# Patient Record
Sex: Female | Born: 1965
Health system: Southern US, Community
[De-identification: ages and names within clinical notes are randomized; demographics above are authoritative.]

## PROBLEM LIST (undated history)

## (undated) ENCOUNTER — Emergency Department (HOSPITAL_COMMUNITY): Payer: BC Managed Care – PPO

## (undated) DIAGNOSIS — G4730 Sleep apnea, unspecified: Secondary | ICD-10-CM

## (undated) DIAGNOSIS — I10 Essential (primary) hypertension: Secondary | ICD-10-CM

## (undated) DIAGNOSIS — E119 Type 2 diabetes mellitus without complications: Secondary | ICD-10-CM

## (undated) DIAGNOSIS — H409 Unspecified glaucoma: Secondary | ICD-10-CM

## (undated) DIAGNOSIS — D649 Anemia, unspecified: Secondary | ICD-10-CM

## (undated) DIAGNOSIS — G43909 Migraine, unspecified, not intractable, without status migrainosus: Secondary | ICD-10-CM

## (undated) DIAGNOSIS — E785 Hyperlipidemia, unspecified: Secondary | ICD-10-CM

## (undated) DIAGNOSIS — T7840XA Allergy, unspecified, initial encounter: Secondary | ICD-10-CM

## (undated) DIAGNOSIS — K219 Gastro-esophageal reflux disease without esophagitis: Secondary | ICD-10-CM

## (undated) DIAGNOSIS — K5792 Diverticulitis of intestine, part unspecified, without perforation or abscess without bleeding: Secondary | ICD-10-CM

## (undated) DIAGNOSIS — D219 Benign neoplasm of connective and other soft tissue, unspecified: Secondary | ICD-10-CM

## (undated) DIAGNOSIS — M1990 Unspecified osteoarthritis, unspecified site: Secondary | ICD-10-CM

## (undated) HISTORY — DX: Morbid (severe) obesity due to excess calories: E66.01

## (undated) HISTORY — DX: Benign neoplasm of connective and other soft tissue, unspecified: D21.9

## (undated) HISTORY — DX: Gastro-esophageal reflux disease without esophagitis: K21.9

## (undated) HISTORY — DX: Allergy, unspecified, initial encounter: T78.40XA

## (undated) HISTORY — PX: OTHER SURGICAL HISTORY: SHX169

## (undated) HISTORY — DX: Type 2 diabetes mellitus without complications: E11.9

## (undated) HISTORY — DX: Diverticulitis of intestine, part unspecified, without perforation or abscess without bleeding: K57.92

## (undated) HISTORY — DX: Migraine, unspecified, not intractable, without status migrainosus: G43.909

## (undated) HISTORY — DX: Essential (primary) hypertension: I10

## (undated) HISTORY — DX: Unspecified glaucoma: H40.9

## (undated) HISTORY — DX: Unspecified osteoarthritis, unspecified site: M19.90

## (undated) HISTORY — DX: Hyperlipidemia, unspecified: E78.5

## (undated) HISTORY — DX: Anemia, unspecified: D64.9

## (undated) HISTORY — DX: Sleep apnea, unspecified: G47.30

---

## 1996-04-21 HISTORY — PX: VAGINAL HYSTERECTOMY: SUR661

## 1998-03-30 ENCOUNTER — Other Ambulatory Visit: Admission: RE | Admit: 1998-03-30 | Discharge: 1998-03-30 | Payer: Self-pay | Admitting: Obstetrics and Gynecology

## 1999-05-30 ENCOUNTER — Other Ambulatory Visit: Admission: RE | Admit: 1999-05-30 | Discharge: 1999-05-30 | Payer: Self-pay | Admitting: Gynecology

## 2001-02-26 ENCOUNTER — Other Ambulatory Visit: Admission: RE | Admit: 2001-02-26 | Discharge: 2001-02-26 | Payer: Self-pay | Admitting: Gynecology

## 2002-02-09 ENCOUNTER — Emergency Department (HOSPITAL_COMMUNITY): Admission: EM | Admit: 2002-02-09 | Discharge: 2002-02-09 | Payer: Self-pay | Admitting: Emergency Medicine

## 2002-02-10 ENCOUNTER — Encounter: Payer: Self-pay | Admitting: Gynecology

## 2002-02-10 ENCOUNTER — Encounter: Admission: RE | Admit: 2002-02-10 | Discharge: 2002-02-10 | Payer: Self-pay | Admitting: Gynecology

## 2002-04-01 ENCOUNTER — Other Ambulatory Visit: Admission: RE | Admit: 2002-04-01 | Discharge: 2002-04-01 | Payer: Self-pay | Admitting: Gynecology

## 2004-09-05 ENCOUNTER — Ambulatory Visit: Payer: Self-pay | Admitting: Family Medicine

## 2004-10-03 ENCOUNTER — Ambulatory Visit: Payer: Self-pay | Admitting: Family Medicine

## 2004-10-24 ENCOUNTER — Other Ambulatory Visit: Admission: RE | Admit: 2004-10-24 | Discharge: 2004-10-24 | Payer: Self-pay | Admitting: Gynecology

## 2005-09-08 ENCOUNTER — Ambulatory Visit: Payer: Self-pay | Admitting: Family Medicine

## 2005-10-27 ENCOUNTER — Other Ambulatory Visit: Admission: RE | Admit: 2005-10-27 | Discharge: 2005-10-27 | Payer: Self-pay | Admitting: Gynecology

## 2006-03-16 ENCOUNTER — Encounter: Admission: RE | Admit: 2006-03-16 | Discharge: 2006-03-16 | Payer: Self-pay | Admitting: Gynecology

## 2006-11-02 ENCOUNTER — Other Ambulatory Visit: Admission: RE | Admit: 2006-11-02 | Discharge: 2006-11-02 | Payer: Self-pay | Admitting: Gynecology

## 2006-11-02 LAB — CONVERTED CEMR LAB: Pap Smear: NORMAL

## 2006-11-19 ENCOUNTER — Encounter: Payer: Self-pay | Admitting: Family Medicine

## 2006-11-30 ENCOUNTER — Ambulatory Visit: Payer: Self-pay | Admitting: Family Medicine

## 2006-11-30 DIAGNOSIS — K219 Gastro-esophageal reflux disease without esophagitis: Secondary | ICD-10-CM | POA: Insufficient documentation

## 2006-12-01 ENCOUNTER — Encounter: Payer: Self-pay | Admitting: Family Medicine

## 2006-12-02 ENCOUNTER — Encounter (INDEPENDENT_AMBULATORY_CARE_PROVIDER_SITE_OTHER): Payer: Self-pay | Admitting: *Deleted

## 2006-12-07 LAB — CONVERTED CEMR LAB
ALT: 12 units/L (ref 0–35)
Alkaline Phosphatase: 46 units/L (ref 39–117)
BUN: 12 mg/dL (ref 6–23)
Basophils Absolute: 0.1 10*3/uL (ref 0.0–0.1)
CO2: 29 meq/L (ref 19–32)
Calcium: 9.1 mg/dL (ref 8.4–10.5)
Cholesterol: 210 mg/dL (ref 0–200)
Eosinophils Absolute: 0.2 10*3/uL (ref 0.0–0.6)
GFR calc Af Amer: 142 mL/min
GFR calc non Af Amer: 118 mL/min
HDL: 44 mg/dL (ref >39.0)
Hemoglobin: 13 g/dL (ref 12.0–15.0)
Lymphocytes Relative: 41.8 % (ref 12.0–46.0)
MCHC: 33.4 g/dL (ref 30.0–36.0)
MCV: 96.2 fL (ref 78.0–100.0)
Monocytes Absolute: 0.3 10*3/uL (ref 0.2–0.7)
Monocytes Relative: 6.8 % (ref 3.0–11.0)
Neutro Abs: 2.3 10*3/uL (ref 1.4–7.7)
Platelets: 310 10*3/uL (ref 150–400)
Potassium: 3.6 meq/L (ref 3.5–5.1)
TSH: 1.59 microintl units/mL (ref 0.35–5.50)
Total Protein: 7.2 g/dL (ref 6.0–8.3)
VLDL: 17 mg/dL (ref 0–40)

## 2007-03-23 ENCOUNTER — Encounter: Payer: Self-pay | Admitting: Family Medicine

## 2007-03-23 ENCOUNTER — Encounter: Admission: RE | Admit: 2007-03-23 | Discharge: 2007-03-23 | Payer: Self-pay | Admitting: Family Medicine

## 2007-03-25 ENCOUNTER — Encounter (INDEPENDENT_AMBULATORY_CARE_PROVIDER_SITE_OTHER): Payer: Self-pay | Admitting: *Deleted

## 2007-04-01 ENCOUNTER — Encounter (INDEPENDENT_AMBULATORY_CARE_PROVIDER_SITE_OTHER): Payer: Self-pay | Admitting: *Deleted

## 2007-04-07 LAB — HM MAMMOGRAPHY: HM Mammogram: NORMAL

## 2007-06-15 ENCOUNTER — Encounter: Payer: Self-pay | Admitting: Family Medicine

## 2007-10-03 ENCOUNTER — Emergency Department (HOSPITAL_COMMUNITY): Admission: EM | Admit: 2007-10-03 | Discharge: 2007-10-03 | Payer: Self-pay | Admitting: Family Medicine

## 2007-11-08 ENCOUNTER — Other Ambulatory Visit: Admission: RE | Admit: 2007-11-08 | Discharge: 2007-11-08 | Payer: Self-pay | Admitting: Obstetrics and Gynecology

## 2008-02-07 ENCOUNTER — Ambulatory Visit: Payer: Self-pay | Admitting: Family Medicine

## 2008-02-07 DIAGNOSIS — I10 Essential (primary) hypertension: Secondary | ICD-10-CM | POA: Insufficient documentation

## 2008-02-07 LAB — CONVERTED CEMR LAB
Bilirubin Urine: NEGATIVE
Glucose, Urine, Semiquant: NEGATIVE
Specific Gravity, Urine: 1.02
WBC Urine, dipstick: NEGATIVE
pH: 5

## 2008-02-08 ENCOUNTER — Encounter: Payer: Self-pay | Admitting: Family Medicine

## 2008-02-11 ENCOUNTER — Telehealth (INDEPENDENT_AMBULATORY_CARE_PROVIDER_SITE_OTHER): Payer: Self-pay | Admitting: *Deleted

## 2008-02-11 ENCOUNTER — Encounter (INDEPENDENT_AMBULATORY_CARE_PROVIDER_SITE_OTHER): Payer: Self-pay | Admitting: *Deleted

## 2008-02-11 LAB — CONVERTED CEMR LAB
Basophils Absolute: 0 10*3/uL (ref 0.0–0.1)
Basophils Relative: 0.6 % (ref 0.0–3.0)
Calcium: 9.1 mg/dL (ref 8.4–10.5)
Cholesterol: 205 mg/dL (ref 0–200)
Creatinine, Ser: 0.6 mg/dL (ref 0.4–1.2)
Direct LDL: 130 mg/dL
Eosinophils Absolute: 0.1 10*3/uL (ref 0.0–0.7)
GFR calc Af Amer: 141 mL/min
GFR calc non Af Amer: 117 mL/min
HDL: 43.6 mg/dL (ref >39.0)
Hemoglobin: 12.8 g/dL (ref 12.0–15.0)
MCHC: 33.9 g/dL (ref 30.0–36.0)
MCV: 96.4 fL (ref 78.0–100.0)
Neutro Abs: 1.8 10*3/uL (ref 1.4–7.7)
RBC: 3.92 M/uL (ref 3.87–5.11)
RDW: 12.6 % (ref 11.5–14.6)
TSH: 1.52 microintl units/mL (ref 0.35–5.50)
Total Bilirubin: 0.7 mg/dL (ref 0.3–1.2)

## 2008-02-21 ENCOUNTER — Ambulatory Visit: Payer: Self-pay | Admitting: Family Medicine

## 2008-04-11 ENCOUNTER — Encounter: Admission: RE | Admit: 2008-04-11 | Discharge: 2008-04-11 | Payer: Self-pay | Admitting: Gynecology

## 2008-11-27 ENCOUNTER — Encounter: Payer: Self-pay | Admitting: Women's Health

## 2008-11-27 ENCOUNTER — Other Ambulatory Visit: Admission: RE | Admit: 2008-11-27 | Discharge: 2008-11-27 | Payer: Self-pay | Admitting: Obstetrics and Gynecology

## 2008-11-27 ENCOUNTER — Ambulatory Visit: Payer: Self-pay | Admitting: Women's Health

## 2009-05-14 ENCOUNTER — Telehealth (INDEPENDENT_AMBULATORY_CARE_PROVIDER_SITE_OTHER): Payer: Self-pay | Admitting: *Deleted

## 2009-05-16 ENCOUNTER — Telehealth (INDEPENDENT_AMBULATORY_CARE_PROVIDER_SITE_OTHER): Payer: Self-pay | Admitting: *Deleted

## 2009-07-04 ENCOUNTER — Encounter (INDEPENDENT_AMBULATORY_CARE_PROVIDER_SITE_OTHER): Payer: Self-pay | Admitting: *Deleted

## 2009-07-04 ENCOUNTER — Ambulatory Visit: Payer: Self-pay | Admitting: Family Medicine

## 2009-07-04 DIAGNOSIS — Z87448 Personal history of other diseases of urinary system: Secondary | ICD-10-CM | POA: Insufficient documentation

## 2009-07-04 LAB — CONVERTED CEMR LAB
Bilirubin Urine: NEGATIVE
Glucose, Urine, Semiquant: NEGATIVE
Ketones, urine, test strip: NEGATIVE
Nitrite: NEGATIVE
Specific Gravity, Urine: 1.015

## 2009-07-09 ENCOUNTER — Telehealth (INDEPENDENT_AMBULATORY_CARE_PROVIDER_SITE_OTHER): Payer: Self-pay | Admitting: *Deleted

## 2009-07-11 ENCOUNTER — Telehealth: Payer: Self-pay | Admitting: Family Medicine

## 2009-07-16 LAB — CONVERTED CEMR LAB
Albumin: 3.9 g/dL (ref 3.5–5.2)
Basophils Absolute: 0 10*3/uL (ref 0.0–0.1)
CO2: 29 meq/L (ref 19–32)
Calcium: 9.3 mg/dL (ref 8.4–10.5)
Cholesterol: 222 mg/dL — ABNORMAL HIGH (ref 0–200)
Direct LDL: 158.2 mg/dL
Eosinophils Absolute: 0.1 10*3/uL (ref 0.0–0.7)
Glucose, Bld: 91 mg/dL (ref 70–99)
H Pylori IgG: NEGATIVE
HCT: 37.2 % (ref 36.0–46.0)
Hemoglobin: 12.5 g/dL (ref 12.0–15.0)
Lymphocytes Relative: 35.6 % (ref 12.0–46.0)
Lymphs Abs: 1.8 10*3/uL (ref 0.7–4.0)
MCHC: 33.6 g/dL (ref 30.0–36.0)
Neutro Abs: 2.8 10*3/uL (ref 1.4–7.7)
RDW: 12.7 % (ref 11.5–14.6)
Sodium: 139 meq/L (ref 135–145)
TSH: 2.54 microintl units/mL (ref 0.35–5.50)
Triglycerides: 113 mg/dL (ref 0.0–149.0)

## 2009-07-18 ENCOUNTER — Encounter: Payer: Self-pay | Admitting: Family Medicine

## 2009-07-24 ENCOUNTER — Ambulatory Visit: Payer: Self-pay | Admitting: Family Medicine

## 2009-07-24 LAB — CONVERTED CEMR LAB: Fecal Occult Bld: NEGATIVE

## 2009-08-03 ENCOUNTER — Encounter (INDEPENDENT_AMBULATORY_CARE_PROVIDER_SITE_OTHER): Payer: Self-pay | Admitting: *Deleted

## 2009-08-03 ENCOUNTER — Ambulatory Visit: Payer: Self-pay | Admitting: Gastroenterology

## 2009-08-03 DIAGNOSIS — R143 Flatulence: Secondary | ICD-10-CM

## 2009-08-03 DIAGNOSIS — R141 Gas pain: Secondary | ICD-10-CM | POA: Insufficient documentation

## 2009-08-03 DIAGNOSIS — R142 Eructation: Secondary | ICD-10-CM

## 2009-08-24 ENCOUNTER — Telehealth: Payer: Self-pay | Admitting: Gastroenterology

## 2009-09-20 ENCOUNTER — Ambulatory Visit: Payer: Self-pay | Admitting: Family Medicine

## 2009-09-20 DIAGNOSIS — N898 Other specified noninflammatory disorders of vagina: Secondary | ICD-10-CM | POA: Insufficient documentation

## 2009-09-20 DIAGNOSIS — M25569 Pain in unspecified knee: Secondary | ICD-10-CM | POA: Insufficient documentation

## 2009-09-20 LAB — CONVERTED CEMR LAB
Nitrite: NEGATIVE
Specific Gravity, Urine: 1.02
WBC Urine, dipstick: NEGATIVE

## 2009-09-21 ENCOUNTER — Encounter: Payer: Self-pay | Admitting: Family Medicine

## 2009-09-24 ENCOUNTER — Telehealth (INDEPENDENT_AMBULATORY_CARE_PROVIDER_SITE_OTHER): Payer: Self-pay | Admitting: *Deleted

## 2009-10-03 ENCOUNTER — Ambulatory Visit: Payer: Self-pay | Admitting: Family Medicine

## 2009-10-04 ENCOUNTER — Encounter: Payer: Self-pay | Admitting: Family Medicine

## 2009-10-04 LAB — CONVERTED CEMR LAB
Bilirubin Urine: NEGATIVE
Crystals: NONE SEEN
Leukocytes, UA: NEGATIVE
RBC / HPF: NONE SEEN (ref <3)
Specific Gravity, Urine: 1.029 (ref 1.005–1.030)
Squamous Epithelial / LPF: NONE SEEN /lpf
Urine Glucose: NEGATIVE mg/dL
Urobilinogen, UA: 0.2 (ref 0.0–1.0)
WBC, UA: NONE SEEN cells/hpf (ref <3)

## 2009-10-30 ENCOUNTER — Encounter: Admission: RE | Admit: 2009-10-30 | Discharge: 2009-10-30 | Payer: Self-pay | Admitting: Gynecology

## 2010-01-02 ENCOUNTER — Telehealth (INDEPENDENT_AMBULATORY_CARE_PROVIDER_SITE_OTHER): Payer: Self-pay | Admitting: *Deleted

## 2010-01-29 ENCOUNTER — Telehealth: Payer: Self-pay | Admitting: Family Medicine

## 2010-05-23 NOTE — Letter (Signed)
Summary: Screening/Wellness Screening Program  Screening/Wellness Screening Program   Imported By: Lanelle Bal 09/28/2009 11:47:40  _____________________________________________________________________  External Attachment:    Type:   Image     Comment:   External Document

## 2010-05-23 NOTE — Letter (Signed)
Summary: [facility]: Immunoassay Fecal Occult Blood (iFOB) Order Form  [facility] [place]  [address] [place], [place] [address]   Phone: [phone]  Fax: [phone]      [facility]: Immunoassay Fecal Occult Blood (iFOB) Order Form   July 04, 2009 MRN: [id number]   Tiffany Gallagher 09-Dec-1965   Physicican Name:____Yvonne Lowne,DO_____________________  Diagnosis Code:______V76.51____________________      Army Fossa CMA

## 2010-05-23 NOTE — Progress Notes (Signed)
Summary: ?yeast infection  Phone Note Outgoing Call Call back at [phone]   Call placed by: Army Fossa CMA,  July 11, 2009 4:01 PM Summary of Call: Pt called and stated that from the ATB she is now has a yeast infection. Would like to know if you would call in an rx for Dilfucan to [facility] on [address]. Please advise. Army Fossa CMA  July 11, 2009 4:02 PM   Follow-up for Phone Call        diflucan 150 #2  1 by mouth x1--may repeat in 3 days as needed  Follow-up by: Loreen Freud DO,  July 11, 2009 8:39 PM  Additional Follow-up for Phone Call Additional follow up Details #1::        Left message for pt that we sent in meds. Army Fossa CMA  July 12, 2009 9:16 AM     New/Updated Medications: FLUCONAZOLE 150 MG TABS (FLUCONAZOLE) 1 by mouth once daily x1.  May repeat in 3 days as needed Prescriptions: FLUCONAZOLE 150 MG TABS (FLUCONAZOLE) 1 by mouth once daily x1.  May repeat in 3 days as needed  #2 x 0   Entered and Authorized by:   Loreen Freud DO   Signed by:   Loreen Freud DO on 07/11/2009   Method used:   Electronically to        [facility] [phone]* (retail)       [address]       [place], [place]  [address]       Ph: [phone]       Fax: [phone]   RxID:   [id number]

## 2010-05-23 NOTE — Progress Notes (Signed)
Summary: lab results  Phone Note Outgoing Call   Call placed by: Army Fossa CMA,  September 24, 2009 1:13 PM Reason for Call: Discuss lab or test results Summary of Call: Regarding lab results,   Chalymdyia/GC Probe- normal  Urine culture: contaminated---- need to repeat if pt still symptomatic Signed by Loreen Freud DO on 09/22/2009 at 3:56 PM   Follow-up for Phone Call        LMTCB> Army Fossa CMA  September 26, 2009 1:33 PM   Additional Follow-up for Phone Call Additional follow up Details #1::        Pt is aware. Army Fossa CMA  September 28, 2009 9:53 AM

## 2010-05-23 NOTE — Progress Notes (Signed)
Summary: HYDROCHLOROTHYIZIDE REFILL 9/14- refill to [facility]   Phone Note Call from Patient Call back at [phone] CELL   Caller: Patient Summary of Call: NEEDS TO REORDER 90 SUPPLY OF WATER PILL--HYDROCHLOROTHYIZIDE  SHE WILL CALL BACK TO ORDER THIS MED FROM HER MAIL ORDER--- Initial call taken by: Jerolyn Shin,  January 02, 2010 10:12 AM  Follow-up for Phone Call        Request for HCTZ last filled 05/16/09 last OV 09/20/09. PLease advise Follow-up by: Almeta Monas CMA Duncan Dull),  January 02, 2010 1:36 PM  Additional Follow-up for Phone Call Additional follow up Details #1::        ok to refill  ##90  2 refills Additional Follow-up by: Loreen Freud DO,  January 02, 2010 1:45 PM    Additional Follow-up for Phone Call Additional follow up Details #2::    left message on machine need to know which mail order company she uses.....Marland KitchenMarland KitchenDoristine Devoid CMA  January 03, 2010 9:46 AM   patient said she is almost out of pills -  send refill to [facility] [address]----90 day supply .Marland KitchenOkey Regal [place]  January 03, 2010 1:54 PM   Prescriptions: HYDROCHLOROTHIAZIDE 25 MG TABS (HYDROCHLOROTHIAZIDE) 1/2 by mouth once daily  #90 x 2   Entered by:   Doristine Devoid CMA   Authorized by:   Loreen Freud DO   Signed by:   Doristine Devoid CMA on 01/04/2010   Method used:   Electronically to        [facility] [phone]* (retail)       [address]       [place], [place]  [address]       Ph: [phone]       Fax: [phone]   RxID:   [id number]
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
## 2010-05-23 NOTE — Progress Notes (Signed)
Summary: cx proc  Phone Note Call from Patient Call back at [phone]   Caller: Patient Call For: Juanda Chance Reason for Call: Talk to Nurse Summary of Call: Patient cx her procedure because medications that Dr Juanda Chance gave her worked , states that she was told that she can cx 24hrs in advanced. Wants to speak to someone because she can't afford a cx fee.   will you charge??   Initial call taken by: Tawni Levy,  Aug 24, 2009 2:09 PM  Follow-up for Phone Call        Please review with Dr Christella Hartigan, this is not my patient. Follow-up by: Hart Carwin MD,  Aug 24, 2009 10:54 PM    Additional Follow-up for Phone Call Additional follow up Details #2::    no charge Follow-up by: Rachael Fee MD,  Aug 27, 2009 6:58 AM  Additional Follow-up for Phone Call Additional follow up Details #3:: Details for Additional Follow-up Action Taken: Patient NOT BILLED. Additional Follow-up by: Leanor Kail [facility],  Aug 28, 2009 4:00 PM

## 2010-05-23 NOTE — Progress Notes (Signed)
Summary: REFILL  Phone Note Refill Request Message from:  Fax from Pharmacy on [facility] DELIV[facility] FAX [phone]  Refills Requested: Medication #1:  HYDROCHLOROTHIAZIDE 25 MG TABS 1/2 by mouth once daily.  Medication #2:  BENAZEPRIL HCL 10 MG TABS 1 by mouth once daily Initial call taken by: Barb Merino,  May 14, 2009 9:59 AM  Follow-up for Phone Call        left message for pt- gave pt 1 fill and pt needs appt.     Prescriptions: HYDROCHLOROTHIAZIDE 25 MG TABS (HYDROCHLOROTHIAZIDE) 1/2 by mouth once daily  #100 x 0   Entered by:   Army Fossa CMA   Authorized by:   Loreen Freud DO   Signed by:   Army Fossa CMA on 05/14/2009   Method used:   Printed then faxed to ...       [facility] (mail-order)       [address]       [place], [place]  [address]       Ph: [phone]       Fax: [phone]   RxID:   [id number] BENAZEPRIL HCL 10 MG TABS (BENAZEPRIL HCL) 1 by mouth once daily  #90 x 0   Entered by:   Army Fossa CMA   Authorized by:   Loreen Freud DO   Signed by:   Army Fossa CMA on 05/14/2009   Method used:   Printed then faxed to ...       [address] [facility] (mail-order)       [address]       [place], [place]  [address]       Ph: [phone]       Fax: [phone]   RxID:   [id number]

## 2010-05-23 NOTE — Assessment & Plan Note (Signed)
Summary: cpx//cigna//lh   Vital Signs:  Patient profile:   45 year old female Height:      65.25 inches Weight:      260 pounds BMI:     43.09 Temp:     98.6 degrees F oral Pulse rate:   87 / minute Pulse rhythm:   regular BP sitting:   126 / 82  (left arm) Cuff size:   regular  Vitals Entered By: Army Fossa CMA (July 04, 2009 8:58 AM) CC: CPX, no pap. possible UTI, Dysuria, Heartburn   History of Present Illness: Pt here for cpe and labs.  Pt sees Tiffany Gallagher at [facility].    Dysuria      This is a 45 year old woman who presents with Dysuria.  The symptoms began 1 week ago.  Pt did take otc yeast medication but then she had dysuria and back pain.  The patient complains of burning with urination and urinary frequency, but denies urgency, hematuria, vaginal discharge, vaginal itching, vaginal sores, and penile discharge.  Associated symptoms include back pain.  The patient denies the following associated symptoms: nausea, vomiting, fever, shaking chills, flank pain, abdominal pain, pelvic pain, and arthralgias.  The patient denies the following risk factors: diabetes, prior antibiotics, immunosuppression, history of GU anomaly, history of pyelonephritis, pregnancy, history of STD, and analgesic abuse.    Heartburn      The patient also presents with Heartburn.  The symptoms began 4-8 weeks ago.  pt stopped taking zantac and then heartburn got worse---then when she started taking it again it did not work.  The patient complains of acid reflux, sour taste in mouth, and epigastric pain.  The patient denies the following alarm features: melena, dysphagia, hematemesis, vomiting, involuntary weight loss >5%, and history of anemia.  Treatment that was tried and either found to be ineffective or stopped due to problems include dietary changes, weight loss, elevating the head of bed, an antacid, an H2 blocker, and a PPI.    Preventive Screening-Counseling & Management  Alcohol-Tobacco  Alcohol drinks/day: <1     Alcohol type: SOCIAL     Smoking Status: current     Smoking Cessation Counseling: yes     Packs/Day: 1/2     Year Started: 1983     Passive Smoke Exposure: no  Caffeine-Diet-Exercise     Caffeine use/day: 1     Does Patient Exercise: no     Exercise Counseling: to improve exercise regimen  Hep-HIV-STD-Contraception     HIV Risk: no     Dental Visit-last 6 months yes     Dental Care Counseling: not indicated; dental care within six months  Safety-Violence-Falls     Seat Belt Use: 100      Sexual History:  single--not sexually active.        Drug Use:  never.    Current Medications (verified): 1)  Benazepril Hcl 10 Mg Tabs (Benazepril Hcl) .Marland Kitchen.. 1 By Mouth Once Daily 2)  Hydrochlorothiazide 25 Mg Tabs (Hydrochlorothiazide) .... 1/2 By Mouth Once Daily 3)  Vitamin C 4)  Vitamin D3 1000 Unit Caps (Cholecalciferol) .Marland Kitchen.. 1 By Mouth Daily 5)  Apap Extra Strength 500 Mg Tabs (Acetaminophen) .... Daily 6)  Omeprazole 20 Mg Cpdr (Omeprazole) .Marland Kitchen.. 1 By Mouth Once Daily 7)  Daily Combo Multivits/calcium  Tabs (Multiple Vitamins-Calcium) 8)  Cipro 500 Mg Tabs (Ciprofloxacin Hcl) .Marland Kitchen.. 1 By Mouth Two Times A Day  Allergies (verified): No Known Drug Allergies  Past History:  Past Medical History: Last updated: 02/07/2008 GERD Hypertension  Past Surgical History: Last updated: 11/30/2006 Caesarean section (1995) Hysterectomy (1998) partial  Family History: Last updated: 11/30/2006 Family History of CAD Female 1st degree relative <60---mother Family History Diabetes 1st degree relative Family History of Stroke F 1st degree relative <60 Family History of Arthritis Family History Hypertension  Social History: Last updated: 11/30/2006 Occupation: Chartered loss adjuster Divorced Current Smoker Alcohol use-no Drug use-no Regular exercise-yes  Risk Factors: Alcohol Use: <1 (07/04/2009) Caffeine Use: 1 (07/04/2009) Exercise: no (07/04/2009)  Risk  Factors: Smoking Status: current (07/04/2009) Packs/Day: 1/2 (07/04/2009) Passive Smoke Exposure: no (07/04/2009)  Family History: Reviewed history from 11/30/2006 and no changes required. Family History of CAD Female 1st degree relative <60---mother Family History Diabetes 1st degree relative Family History of Stroke F 1st degree relative <60 Family History of Arthritis Family History Hypertension  Social History: Reviewed history from 11/30/2006 and no changes required. Occupation: Chartered loss adjuster Divorced Current Smoker Alcohol use-no Drug use-no Regular exercise-yes Dental Care w/in 6 mos.:  yes Does Patient Exercise:  no Sexual History:  single--not sexually active Drug Use:  never  Review of Systems      See HPI General:  Denies chills, fatigue, fever, loss of appetite, malaise, sleep disorder, sweats, weakness, and weight loss. Eyes:  Denies blurring, discharge, double vision, eye irritation, eye pain, halos, itching, light sensitivity, red eye, vision loss-1 eye, and vision loss-both eyes; optho-- q49yr. ENT:  Denies decreased hearing, difficulty swallowing, ear discharge, earache, hoarseness, nasal congestion, nosebleeds, postnasal drainage, ringing in ears, sinus pressure, and sore throat. CV:  Denies bluish discoloration of lips or nails, chest pain or discomfort, difficulty breathing at night, difficulty breathing while lying down, fainting, fatigue, leg cramps with exertion, lightheadness, near fainting, palpitations, shortness of breath with exertion, swelling of feet, swelling of hands, and weight gain. Resp:  Denies chest discomfort, chest pain with inspiration, cough, coughing up blood, excessive snoring, hypersomnolence, morning headaches, pleuritic, shortness of breath, sputum productive, and wheezing. GI:  Complains of indigestion; denies abdominal pain, bloody stools, change in bowel habits, constipation, dark tarry stools, diarrhea, excessive appetite, gas, hemorrhoids,  loss of appetite, nausea, vomiting, vomiting blood, and yellowish skin color. GU:  Complains of dysuria; denies abnormal vaginal bleeding, decreased libido, discharge, genital sores, hematuria, incontinence, nocturia, urinary frequency, and urinary hesitancy. MS:  Denies joint pain, joint redness, joint swelling, loss of strength, low back pain, mid back pain, muscle aches, muscle , cramps, muscle weakness, stiffness, and thoracic pain. Derm:  Denies changes in color of skin, changes in nail beds, dryness, excessive perspiration, flushing, hair loss, insect bite(s), itching, lesion(s), poor wound healing, and rash. Neuro:  Denies brief paralysis, difficulty with concentration, disturbances in coordination, falling down, headaches, inability to speak, memory loss, numbness, poor balance, seizures, sensation of room spinning, tingling, tremors, visual disturbances, and weakness. Psych:  Denies alternate hallucination ( auditory/visual), anxiety, depression, easily angered, easily tearful, irritability, mental problems, panic attacks, sense of great danger, suicidal thoughts/plans, thoughts of violence, unusual visions or sounds, and thoughts /plans of harming others. Endo:  Denies cold intolerance, excessive hunger, excessive thirst, excessive urination, heat intolerance, polyuria, and weight change. Heme:  Denies abnormal bruising, bleeding, enlarge lymph nodes, fevers, pallor, and skin discoloration. Allergy:  Denies hives or rash, itching eyes, persistent infections, seasonal allergies, and sneezing.  Physical Exam  General:  Well-developed,well-nourished,in no acute distress; alert,appropriate and cooperative throughout examination Head:  Normocephalic and atraumatic without obvious abnormalities. No apparent alopecia or balding. Eyes:  vision  grossly intact, pupils equal, pupils round, pupils reactive to light, and no injection.   Ears:  External ear exam shows no significant lesions or  deformities.  Otoscopic examination reveals clear canals, tympanic membranes are intact bilaterally without bulging, retraction, inflammation or discharge. Hearing is grossly normal bilaterally. Nose:  External nasal examination shows no deformity or inflammation. Nasal mucosa are pink and moist without lesions or exudates. Mouth:  Oral mucosa and oropharynx without lesions or exudates.  Teeth in good repair. Neck:  No deformities, masses, or tenderness noted.no carotid bruits.   Chest Wall:  No deformities, masses, or tenderness noted. Breasts:  No mass, nodules, thickening, tenderness, bulging, retraction, inflamation, nipple discharge or skin changes noted.   Lungs:  Normal respiratory effort, chest expands symmetrically. Lungs are clear to auscultation, no crackles or wheezes. Heart:  Normal rate and regular rhythm. S1 and S2 normal without gallop, murmur, click, rub or other extra sounds. Abdomen:  Bowel sounds positive,abdomen soft and non-tender without masses, organomegaly or hernias noted. Rectal:  gyn Genitalia:  gyn Msk:  normal ROM, no joint tenderness, no joint swelling, no joint warmth, no redness over joints, no joint deformities, no joint instability, and no crepitation.   Pulses:  R posterior tibial normal, R dorsalis pedis normal, R carotid normal, L posterior tibial normal, L dorsalis pedis normal, and L carotid normal.   Extremities:  No clubbing, cyanosis, edema, or deformity noted with normal full range of motion of all joints.   Neurologic:  No cranial nerve deficits noted. Station and gait are normal. Plantar reflexes are down-going bilaterally. DTRs are symmetrical throughout. Sensory, motor and coordinative functions appear intact. Skin:  Intact without suspicious lesions or rashes Cervical Nodes:  No lymphadenopathy noted Axillary Nodes:  No palpable lymphadenopathy Psych:  Cognition and judgment appear intact. Alert and cooperative with normal attention span and  concentration. No apparent delusions, illusions, hallucinations   Impression & Recommendations:  Problem # 1:  PREVENTIVE HEALTH CARE (ICD-V70.0) GHM utd pap and mammo per gyn Orders: Venipuncture (16109) TLB-Lipid Panel (80061-LIPID) TLB-BMP (Basic Metabolic Panel-BMET) (80048-METABOL) TLB-CBC Platelet - w/Differential (85025-CBCD) TLB-Hepatic/Liver Function Pnl (80076-HEPATIC) TLB-TSH (Thyroid Stimulating Hormone) (84443-TSH) TLB-H. Pylori Abs(Helicobacter Pylori) (86677-HELICO) EKG w/ Interpretation (93000) UA Dipstick w/o Micro (manual) (60454)  Problem # 2:  HYPERTENSION (ICD-401.9)  Her updated medication list for this problem includes:    Benazepril Hcl 10 Mg Tabs (Benazepril hcl) .Marland Kitchen... 1 by mouth once daily    Hydrochlorothiazide 25 Mg Tabs (Hydrochlorothiazide) .Marland Kitchen... 1/2 by mouth once daily  Orders: Venipuncture (09811) TLB-Lipid Panel (80061-LIPID) TLB-BMP (Basic Metabolic Panel-BMET) (80048-METABOL) TLB-CBC Platelet - w/Differential (85025-CBCD) TLB-Hepatic/Liver Function Pnl (80076-HEPATIC) TLB-TSH (Thyroid Stimulating Hormone) (84443-TSH) TLB-H. Pylori Abs(Helicobacter Pylori) (86677-HELICO) EKG w/ Interpretation (93000) UA Dipstick w/o Micro (manual) (91478)  BP today: 126/82 Prior BP: 120/84 (02/21/2008)  Labs Reviewed: K+: 4.0 (02/07/2008) Creat: : 0.6 (02/07/2008)   Chol: 205 (02/07/2008)   HDL: 43.6 (02/07/2008)   LDL: DEL (02/07/2008)   TG: 64 (02/07/2008)  Problem # 3:  GERD (ICD-530.81)  Her updated medication list for this problem includes:    Omeprazole 20 Mg Cpdr (Omeprazole) .Marland Kitchen... 1 by mouth once daily  Orders: Gastroenterology Referral (GI) Venipuncture (29562) TLB-Lipid Panel (80061-LIPID) TLB-BMP (Basic Metabolic Panel-BMET) (80048-METABOL) TLB-CBC Platelet - w/Differential (85025-CBCD) TLB-Hepatic/Liver Function Pnl (80076-HEPATIC) TLB-TSH (Thyroid Stimulating Hormone) (84443-TSH) TLB-H. Pylori Abs(Helicobacter Pylori)  (86677-HELICO) EKG w/ Interpretation (93000) UA Dipstick w/o Micro (manual) (13086)  Diagnostics Reviewed:  Discussed lifestyle modifications, diet, antacids/medications, and preventive measures. Handout provided.  Problem # 4:  HEMATURIA, HX OF (ICD-V13.09)  Orders: T-Culture, Urine (16109-60454) EKG w/ Interpretation (93000) UA Dipstick w/o Micro (manual) (09811)  Complete Medication List: 1)  Benazepril Hcl 10 Mg Tabs (Benazepril hcl) .Marland Kitchen.. 1 by mouth once daily 2)  Hydrochlorothiazide 25 Mg Tabs (Hydrochlorothiazide) .... 1/2 by mouth once daily 3)  Vitamin C  4)  Vitamin D3 1000 Unit Caps (Cholecalciferol) .Marland Kitchen.. 1 by mouth daily 5)  Apap Extra Strength 500 Mg Tabs (Acetaminophen) .... Daily 6)  Omeprazole 20 Mg Cpdr (Omeprazole) .Marland Kitchen.. 1 by mouth once daily 7)  Daily Combo Multivits/calcium Tabs (Multiple vitamins-calcium) 8)  Cipro 500 Mg Tabs (Ciprofloxacin hcl) .Marland Kitchen.. 1 by mouth two times a day Prescriptions: OMEPRAZOLE 20 MG CPDR (OMEPRAZOLE) 1 by mouth once daily  #30 x 11   Entered and Authorized by:   Loreen Freud DO   Signed by:   Loreen Freud DO on 07/04/2009   Method used:   Electronically to        [facility] [phone]* (retail)       [address]       [place], [place]  [address]       Ph: [phone]       Fax: [phone]   RxID:   [id number] CIPRO 500 MG TABS (CIPROFLOXACIN HCL) 1 by mouth two times a day  #10 x 0   Entered and Authorized by:   Loreen Freud DO   Signed by:   Loreen Freud DO on 07/04/2009   Method used:   Electronically to        [facility] [phone]* (retail)       [address]       [place], [place]  [address]       Ph: [phone]       Fax: [phone]   RxID:   [id number]   Laboratory Results   Urine Tests    Routine Urinalysis   Color: yellow Appearance: Clear Glucose: negative   (Normal Range: Negative) Bilirubin: negative   (Normal Range: Negative) Ketone: negative   (Normal Range: Negative) Spec.  Gravity: 1.015   (Normal Range: 1.003-1.035) Blood: large   (Normal Range: Negative) pH: 6.5   (Normal Range: 5.0-8.0) Protein: negative   (Normal Range: Negative) Urobilinogen: 0.2   (Normal Range: 0-1) Nitrite: negative   (Normal Range: Negative) Leukocyte Esterace: negative   (Normal Range: Negative)    CommentsArmy Fossa CMA  July 04, 2009 9:09 AM       EKG  Procedure date:  07/04/2009  Findings:      Normal sinus rhythm with rate of:  61 bpm

## 2010-05-23 NOTE — Assessment & Plan Note (Signed)
History of Present Illness Visit Type: Initial Consult Primary GI MD: Rob Bunting MD Primary Provider: Loreen Freud, DO Requesting Provider: Loreen Freud, DO Chief Complaint: GERD History of Present Illness:     pleasant 45 yo woman. who had been having "trapped gas" feelings, belches alot.  Eating causes a feeling of an air bubble. So does not eating for long time. She feels like she has to eat constantly.  if she presses on her left arm, near her elbow she will belch. If she presses on her right leg, near her knee, she will also belch.  She was started on omeprazole recently, thinks things are better.  One month ago.  She takes it in afternoon or during lunchtime.  Or just before a meal.  She gets pyroris at times, this is much improved with PPI.  She has a right chest pressure, bubble sensation.  Worse with eating.  recent CBC, CMET, fobt were all negative.  NO vomitting blood, no overt lower GI bleeding.  Rare NSAIDs.  Overall she gained about has gained 10 pounds in 2 years.           Current Medications (verified): 1)  Benazepril Hcl 10 Mg Tabs (Benazepril Hcl) .Marland Kitchen.. 1 By Mouth Once Daily 2)  Hydrochlorothiazide 25 Mg Tabs (Hydrochlorothiazide) .... 1/2 By Mouth Once Daily 3)  Vitamin C 4)  Vitamin D3 1000 Unit Caps (Cholecalciferol) .Marland Kitchen.. 1 By Mouth Daily 5)  Apap Extra Strength 500 Mg Tabs (Acetaminophen) .... Daily 6)  Omeprazole 20 Mg Cpdr (Omeprazole) .Marland Kitchen.. 1 By Mouth Once Daily 7)  Daily Combo Multivits/calcium  Tabs (Multiple Vitamins-Calcium) 8)  Calcium-Vitamin D 600-200 Mg-Unit Tabs (Calcium-Vitamin D) .Marland Kitchen.. 1 By Mouth Once Daily  Allergies (verified): No Known Drug Allergies  Past History:  Past Medical History: GERD Hypertension obesity  Past Surgical History: Caesarean section (1995) Hysterectomy (1998) partial   Family History: Family History of CAD Female 1st degree relative <60---mother Family History Diabetes 1st degree relative Family  History of Stroke F 1st degree relative <60 Family History of Arthritis Family History Hypertension no colon cancer  Social History: Occupation: Chartered loss adjuster Divorced Current Smoker Alcohol use-no Drug use-no Regular exercise-yes  Review of Systems       Pertinent positive and negative review of systems were noted in the above HPI and GI specific review of systems.  All other review of systems was otherwise negative.   Vital Signs:  Patient profile:   45 year old female Height:      65.5 inches Weight:      261 pounds BMI:     42.93 Pulse rate:   64 / minute Pulse rhythm:   regular BP sitting:   108 / 68  (right arm)  Vitals Entered By: Milford Cage NCMA (August 03, 2009 8:39 AM)  Physical Exam  Additional Exam:  Constitutional: obese otherwise generally well appearing Psychiatric: alert and oriented times 3 Eyes: extraocular movements intact Mouth: oropharynx moist, no lesions Neck: supple, no lymphadenopathy Cardiovascular: heart regular rate and rythm Lungs: CTA bilaterally Abdomen: soft, non-tender, non-distended, no obvious ascites, no peritoneal signs, normal bowel sounds Extremities: no lower extremity edema bilaterally Skin: no lesions on visible extremities    Impression & Recommendations:  Problem # 1:  belching, GERD-like symptoms, obesity some of her symptoms could be explained by GERD. Belching can be a GERD phenomenon. She does note that her more classic pyrosis is much improved with proton pump inhibitor. The belching is also somewhat improved. I explained  to her that there can be no relation between pressing on her left elbow or her right knee and belching. She is very convinced that is the case. I suspect there is a certain amount of functional disorder here. I recommended she change the way she is taking her proton pump inhibitor. I also recommended she begin taking a single Gas-X pill with every meal and we will proceed with EGD at her soonest convenience  check for peptic ulcer disease, GERD complications. I told her to stop smoking.  Patient Instructions: 1)  You should change the way you are taking your antiacid medicine (omeprazole) so that you are taking it 20-30 minutes prior to a decent meal as that is the way the pill is designed to work most effectively. 2)  You will be scheduled to have an upper endoscopy. 3)  Try one gas-x pill with every meal. 4)  The medication list was reviewed and reconciled.  All changed / newly prescribed medications were explained.  A complete medication list was provided to the patient / caregiver.  Appended Document: Orders Update/EGD    Clinical Lists Changes  Problems: Added new problem of FLATULENCE ERUCTATION AND GAS PAIN (ICD-787.3) Orders: Added new Test order of EGD (EGD) - Signed

## 2010-05-23 NOTE — Progress Notes (Signed)
Summary: BENAZEPRIL REFILL  Phone Note Refill Request Message from:  Fax from Pharmacy on January 29, 2010 3:09 PM  Refills Requested: Medication #1:  BENAZEPRIL HCL 10 MG TABS 1 by mouth once daily [facility] [place], FAX [phone]  Initial call taken by: Jerolyn Shin,  January 29, 2010 3:10 PM  Follow-up for Phone Call        Last filled 05/14/09 and  OV 09/20/09 please advise Follow-up by: Almeta Monas CMA Duncan Dull),  January 29, 2010 5:02 PM  Additional Follow-up for Phone Call Additional follow up Details #1::        ok to refill x1   2 refills Additional Follow-up by: Loreen Freud DO,  January 29, 2010 5:05 PM    Prescriptions: BENAZEPRIL HCL 10 MG TABS (BENAZEPRIL HCL) 1 by mouth once daily  #90 x 2   Entered by:   Almeta Monas CMA (AAMA)   Authorized by:   Loreen Freud DO   Signed by:   Almeta Monas CMA (AAMA) on 01/30/2010   Method used:   Faxed to ...       [address] [facility] (mail-order)       [address]       [place], [place]  [address]       Ph: [phone]       Fax: [phone]   RxID:   [id number]

## 2010-05-23 NOTE — Progress Notes (Signed)
Summary: Lab Results  Phone Note Outgoing Call   Call placed by: Army Fossa CMA,  July 09, 2009 10:38 AM Reason for Call: Discuss lab or test results Summary of Call: Regarding Urine culture results, LMTCB:  If pt is symptomatic---recheck urine , C&S.   Signed by Loreen Freud DO on 07/08/2009 at 11:16 AM  Follow-up for Phone Call        Pt states that everything is getting better, but if it does not clear up all the way over the next few days she will come in to recheck her urine and do a C&S. Army Fossa CMA  July 11, 2009 1:25 PM

## 2010-05-23 NOTE — Letter (Signed)
Summary: EGD Instructions  [facility]  [address] [place], [place] [address]   Phone: [phone]  Fax: [phone]       Tiffany Gallagher    12/04/1965    MRN: [id number]       Procedure Day /Date:08/27/09     Arrival Time: 3 pm     Procedure Time:4 pm     Location of Procedure:                    [facility] (4th Floor)    PREPARATION FOR ENDOSCOPY   On 08/27/09  THE DAY OF THE PROCEDURE:  1.   No solid foods, milk or milk products are allowed after midnight the night before your procedure.  2.   Do not drink anything colored red or purple.  Avoid juices with pulp.  No orange juice.  3.  You may drink clear liquids until 2 pm , which is 2 hours before your procedure.                                                                                                CLEAR LIQUIDS INCLUDE: Water Jello Ice Popsicles Tea (sugar ok, no milk/cream) Powdered fruit flavored drinks Coffee (sugar ok, no milk/cream) Gatorade Juice: apple, white grape, white cranberry  Lemonade Clear bullion, consomm, broth Carbonated beverages (any kind) Strained chicken noodle soup Hard Candy   MEDICATION INSTRUCTIONS  Unless otherwise instructed, you should take regular prescription medications with a small sip of water as early as possible the morning of your procedure.             OTHER INSTRUCTIONS  You will need a responsible adult at least 45 years of age to accompany you and drive you home.   This person must remain in the waiting room during your procedure.  Wear loose fitting clothing that is easily removed.  Leave jewelry and other valuables at home.  However, you may wish to bring a book to read or an iPod/MP3 player to listen to music as you wait for your procedure to start.  Remove all body piercing jewelry and leave at home.  Total time from sign-in until discharge is approximately 2-3 hours.  You should go home directly after your  procedure and rest.  You can resume normal activities the day after your procedure.  The day of your procedure you should not:   Drive   Make legal decisions   Operate machinery   Drink alcohol   Return to work  You will receive specific instructions about eating, activities and medications before you leave.    The above instructions have been reviewed and explained to me by   _______________________    I fully understand and can verbalize these instructions _____________________________ Date _________

## 2010-05-23 NOTE — Progress Notes (Signed)
Summary: Refill request from [facility]   Phone Note Refill Request Message from:  Fax from Pharmacy on May 16, 2009 9:55 AM  Refills Requested: Medication #1:  HYDROCHLOROTHIAZIDE 25 MG TABS 1/2 by mouth once daily.   Dosage confirmed as above?Dosage Confirmed refill request from [facility] [phone], option 3 & refer to order #[id number]  Initial call taken by: Michaelle Copas,  May 16, 2009 9:57 AM    Prescriptions: HYDROCHLOROTHIAZIDE 25 MG TABS (HYDROCHLOROTHIAZIDE) 1/2 by mouth once daily  #60 x 0   Entered by:   Army Fossa CMA   Authorized by:   Loreen Freud DO   Signed by:   Army Fossa CMA on 05/16/2009   Method used:   Faxed to ...       [address] [facility] (mail-order)       [address]       [place], [place]  [address]       Ph: [phone]       Fax: [phone]   RxID:   [id number]

## 2010-06-12 ENCOUNTER — Encounter (INDEPENDENT_AMBULATORY_CARE_PROVIDER_SITE_OTHER): Payer: Managed Care, Other (non HMO) | Admitting: Women's Health

## 2010-06-12 ENCOUNTER — Other Ambulatory Visit: Payer: Self-pay | Admitting: Women's Health

## 2010-06-12 ENCOUNTER — Other Ambulatory Visit (HOSPITAL_COMMUNITY)
Admission: RE | Admit: 2010-06-12 | Discharge: 2010-06-12 | Disposition: A | Discharge status: Home / Self Care | Payer: Managed Care, Other (non HMO) | Source: Ambulatory Visit | Attending: Gynecology | Admitting: Gynecology

## 2010-06-12 DIAGNOSIS — Z01419 Encounter for gynecological examination (general) (routine) without abnormal findings: Secondary | ICD-10-CM

## 2010-06-12 DIAGNOSIS — Z124 Encounter for screening for malignant neoplasm of cervix: Secondary | ICD-10-CM | POA: Insufficient documentation

## 2010-06-12 DIAGNOSIS — B373 Candidiasis of vulva and vagina: Secondary | ICD-10-CM

## 2010-07-04 ENCOUNTER — Encounter: Payer: Self-pay | Admitting: Family Medicine

## 2010-07-26 ENCOUNTER — Encounter: Payer: Self-pay | Admitting: Family Medicine

## 2010-07-27 ENCOUNTER — Other Ambulatory Visit: Payer: Self-pay | Admitting: Family Medicine

## 2010-07-31 ENCOUNTER — Ambulatory Visit (INDEPENDENT_AMBULATORY_CARE_PROVIDER_SITE_OTHER): Payer: Managed Care, Other (non HMO) | Admitting: Family Medicine

## 2010-07-31 ENCOUNTER — Encounter: Payer: Self-pay | Admitting: Family Medicine

## 2010-07-31 VITALS — BP 132/84 | HR 64 | Ht 65.5 in | Wt 271.4 lb

## 2010-07-31 DIAGNOSIS — Z0000 Encounter for general adult medical examination without abnormal findings: Secondary | ICD-10-CM

## 2010-07-31 DIAGNOSIS — Z87448 Personal history of other diseases of urinary system: Secondary | ICD-10-CM

## 2010-07-31 DIAGNOSIS — I10 Essential (primary) hypertension: Secondary | ICD-10-CM

## 2010-07-31 DIAGNOSIS — N390 Urinary tract infection, site not specified: Secondary | ICD-10-CM

## 2010-07-31 DIAGNOSIS — K219 Gastro-esophageal reflux disease without esophagitis: Secondary | ICD-10-CM

## 2010-07-31 LAB — CBC WITH DIFFERENTIAL/PLATELET
Basophils Absolute: 0 10*3/uL (ref 0.0–0.1)
Eosinophils Absolute: 0.2 10*3/uL (ref 0.0–0.7)
HCT: 37.8 % (ref 36.0–46.0)
Hemoglobin: 12.7 g/dL (ref 12.0–15.0)
Lymphs Abs: 2.2 10*3/uL (ref 0.7–4.0)
MCHC: 33.6 g/dL (ref 30.0–36.0)
Monocytes Relative: 6.6 % (ref 3.0–12.0)
Neutro Abs: 3.5 10*3/uL (ref 1.4–7.7)
Platelets: 281 10*3/uL (ref 150.0–400.0)
RDW: 13.5 % (ref 11.5–14.6)

## 2010-07-31 LAB — POCT URINALYSIS DIPSTICK
Glucose, UA: NEGATIVE
Nitrite, UA: NEGATIVE
Protein, UA: 100
Urobilinogen, UA: 0.2

## 2010-07-31 LAB — LDL CHOLESTEROL, DIRECT: Direct LDL: 155.9 mg/dL

## 2010-07-31 LAB — HEPATIC FUNCTION PANEL
ALT: 16 U/L (ref 0–35)
AST: 14 U/L (ref 0–37)
Albumin: 3.7 g/dL (ref 3.5–5.2)
Total Bilirubin: 0.4 mg/dL (ref 0.3–1.2)

## 2010-07-31 LAB — BASIC METABOLIC PANEL
BUN: 13 mg/dL (ref 6–23)
Calcium: 9 mg/dL (ref 8.4–10.5)
Chloride: 105 mEq/L (ref 96–112)
Creatinine, Ser: 0.7 mg/dL (ref 0.4–1.2)

## 2010-07-31 LAB — LIPID PANEL
Cholesterol: 209 mg/dL — ABNORMAL HIGH (ref 0–200)
Total CHOL/HDL Ratio: 5
Triglycerides: 92 mg/dL (ref 0.0–149.0)
VLDL: 18.4 mg/dL (ref 0.0–40.0)

## 2010-07-31 LAB — TSH: TSH: 1.26 u[IU]/mL (ref 0.35–5.50)

## 2010-07-31 MED ORDER — OMEPRAZOLE 20 MG PO CPDR: 20.0000 mg | DELAYED_RELEASE_CAPSULE | Freq: Every day | ORAL | Status: DC

## 2010-07-31 NOTE — Assessment & Plan Note (Signed)
Pt had urology w/u---negative Will get records

## 2010-07-31 NOTE — Progress Notes (Signed)
  Subjective:     Tiffany Gallagher is a 45 y.o. female and is here for a comprehensive physical exam. The patient reports problems - vaginal irritation-- but pt refused pap/pelvic exam.  Pt saw urologist last year or year before secondary to hematuria and w/u was negative.  Pt had mammogram in December.    History   Social History  . Marital Status: Single    Spouse Name: N/A    Number of Children: 1  . Years of Education: 17   Occupational History  . customer service     Social History Main Topics  . Smoking status: Current Everyday Smoker -- 0.5 packs/day for 20 years    Types: Cigarettes  . Smokeless tobacco: Never Used  . Alcohol Use: 0.6 oz/week    1 Glasses of wine per week     less than 1 glass a week  . Drug Use: No  . Sexually Active: Yes -- Female partner(s)    Birth Control/ Protection: Surgical, Condom   Other Topics Concern  . Not on file   Social History Narrative  . No narrative on file   Health Maintenance  Topic Date Due  . Pap Smear  06/12/2013  . Tetanus/tdap  02/06/2018    The following portions of the patient's history were reviewed and updated as appropriate: allergies, current medications, past family history, past medical history, past social history, past surgical history and problem list.  Review of Systems A comprehensive review of systems was negative except for: Genitourinary: positive for vaginal irritation   Objective:    BP 132/84  Pulse 64  Ht 5' 5.5" (1.664 m)  Wt 271 lb 6.4 oz (123.106 kg)  BMI 44.48 kg/m2 General appearance: alert, cooperative, appears stated age, no distress and moderately obese Head: Normocephalic, without obvious abnormality, atraumatic Eyes: conjunctivae/corneas clear. PERRL, EOM's intact. Fundi benign. Ears: normal TM's and external ear canals both ears Nose: Nares normal. Septum midline. Mucosa normal. No drainage or sinus tenderness. Throat: lips, mucosa, and tongue normal; teeth and gums normal Neck: no  adenopathy, no carotid bruit, no JVD, supple, symmetrical, trachea midline and thyroid not enlarged, symmetric, no tenderness/mass/nodules Lungs: clear to auscultation bilaterally Breasts: gyn Heart: regular rate and rhythm, S1, S2 normal, no murmur, click, rub or gallop Abdomen: soft, non-tender; bowel sounds normal; no masses,  no organomegaly Pelvic: gyn Extremities: extremities normal, atraumatic, no cyanosis or edema Pulses: 2+ and symmetric Skin: Skin color, texture, turgor normal. No rashes or lesions Lymph nodes: Cervical, supraclavicular, and axillary nodes normal. Neurologic: Grossly normal    Assessment:    Healthy female exam.      Plan:     See After Visit Summary for Counseling Recommendations

## 2010-07-31 NOTE — Assessment & Plan Note (Signed)
Stable con't meds Pt is also taking aloe

## 2010-07-31 NOTE — Assessment & Plan Note (Signed)
Borderline Pt is not consistent with taking meds

## 2010-08-02 ENCOUNTER — Other Ambulatory Visit: Payer: Self-pay | Admitting: *Deleted

## 2010-08-02 ENCOUNTER — Encounter: Payer: Self-pay | Admitting: *Deleted

## 2010-08-02 LAB — URINE CULTURE
Colony Count: NO GROWTH
Organism ID, Bacteria: NO GROWTH

## 2010-08-02 MED ORDER — ATORVASTATIN CALCIUM 20 MG PO TABS: 20.0000 mg | ORAL_TABLET | Freq: Every day | ORAL | Status: DC

## 2010-08-02 NOTE — Telephone Encounter (Addendum)
Pt aware, Letter Mail, appt scheduled   Message copied by Candie Echevaria on Fri Aug 02, 2010  8:28 AM ------      Message from: Loreen Freud      Created: Thu Aug 01, 2010  5:36 PM       Cholesterol--- LDL goal < 100,  HDL >50,  TG < 150.  Diet and exercise will increase HDL and decrease LDL and TG.  Fish,  Fish Oil, Flaxseed oil will also help increase the HDL and decrease Triglycerides.   Recheck labs in 3months.  272.4  Lipid, hep      Start Lipitor 20 mg  #30  1 po qhs , 2 refills -

## 2010-08-08 ENCOUNTER — Telehealth: Payer: Self-pay | Admitting: *Deleted

## 2010-08-08 NOTE — Telephone Encounter (Signed)
Pt can break it in half and dec to 10 mg

## 2010-08-08 NOTE — Telephone Encounter (Signed)
Pt states that since starting Lipitor she has experience pale stools, finger cramping and some tingling in her hand. Pt note that these are some of the listed side effect for med, however Pt would like to know if these are severe enough to stop med or should she continue on. Please advise.

## 2010-08-08 NOTE — Telephone Encounter (Signed)
Spoke with patient and advised of Dr.Lowne's recommendations, she voiced understanding      KP

## 2010-09-09 ENCOUNTER — Other Ambulatory Visit: Payer: Self-pay | Admitting: *Deleted

## 2010-09-09 NOTE — Telephone Encounter (Signed)
Can try livalo 2 mg po qhs and take coQ10.

## 2010-09-09 NOTE — Telephone Encounter (Signed)
Pt states that symptoms did not improve so she has stopped the Lipitor. Pt would like to know if there is another med that she can try to help her cholesterol. Pt has pending OV scheduled for next wed to discuss on going issue with legs that she feels is related to med.Pt uses [facility] order

## 2010-09-10 MED ORDER — PITAVASTATIN CALCIUM 2 MG PO TABS: 1.0000 | ORAL_TABLET | Freq: Every day | ORAL | Status: DC

## 2010-09-10 NOTE — Telephone Encounter (Signed)
Discuss with patient, Rx sent to pharmacy. 

## 2010-09-10 NOTE — Telephone Encounter (Signed)
Left message to call office

## 2010-09-18 ENCOUNTER — Encounter: Payer: Self-pay | Admitting: Family Medicine

## 2010-09-18 ENCOUNTER — Ambulatory Visit (INDEPENDENT_AMBULATORY_CARE_PROVIDER_SITE_OTHER): Payer: Managed Care, Other (non HMO) | Admitting: Family Medicine

## 2010-09-18 VITALS — BP 130/90 | HR 75 | Temp 98.6°F | Wt 268.0 lb

## 2010-09-18 DIAGNOSIS — K219 Gastro-esophageal reflux disease without esophagitis: Secondary | ICD-10-CM

## 2010-09-18 DIAGNOSIS — R252 Cramp and spasm: Secondary | ICD-10-CM

## 2010-09-18 DIAGNOSIS — R319 Hematuria, unspecified: Secondary | ICD-10-CM

## 2010-09-18 DIAGNOSIS — I10 Essential (primary) hypertension: Secondary | ICD-10-CM

## 2010-09-18 DIAGNOSIS — R609 Edema, unspecified: Secondary | ICD-10-CM

## 2010-09-18 LAB — POCT URINALYSIS DIPSTICK
Bilirubin, UA: NEGATIVE
Nitrite, UA: NEGATIVE
Urobilinogen, UA: 0.2
pH, UA: 7.5

## 2010-09-18 LAB — BASIC METABOLIC PANEL
BUN: 16 mg/dL (ref 6–23)
Calcium: 8.9 mg/dL (ref 8.4–10.5)
GFR: 129.19 mL/min (ref >60.00)
Glucose, Bld: 86 mg/dL (ref 70–99)

## 2010-09-18 MED ORDER — HYDROCHLOROTHIAZIDE 25 MG PO TABS: 25.0000 mg | ORAL_TABLET | ORAL | Status: DC

## 2010-09-18 MED ORDER — OMEPRAZOLE 20 MG PO CPDR: 20.0000 mg | DELAYED_RELEASE_CAPSULE | Freq: Every day | ORAL | Status: DC

## 2010-09-18 NOTE — Assessment & Plan Note (Signed)
Keep legs elevated Increase hctz to 1 tab daily Recheck 2 weeks

## 2010-09-18 NOTE — Progress Notes (Signed)
  Subjective:    Patient ID: Tiffany Gallagher, female    DOB: November 06, 1965, 45 y.o.   MRN: [id number]  Gastrophageal Reflux She complains of belching and coughing. She reports no abdominal pain, no chest pain, no choking, no dysphagia, no early satiety, no globus sensation, no heartburn, no hoarse voice, no nausea, no sore throat, no stridor, no tooth decay, no water brash or no wheezing. This is a chronic problem. The problem occurs constantly. The problem has been unchanged. Pertinent negatives include no anemia, fatigue, melena, muscle weakness, orthopnea or weight loss. Risk factors include lack of exercise and obesity. She has tried a diet change and a PPI for the symptoms. The treatment provided mild relief. Past procedures do not include an EGD. Past invasive treatments do not include gastroplasty, gastroplication or reflux surgery.  Pt also c/o swelling in feet and numbness and cramping in legs.  No calf pain,  No sob, no chest pain.     Review of Systems  Constitutional: Negative for weight loss and fatigue.  HENT: Negative for sore throat and hoarse voice.   Respiratory: Positive for cough. Negative for choking, chest tightness, shortness of breath and wheezing.   Cardiovascular: Negative for chest pain, palpitations and leg swelling.  Gastrointestinal: Negative for heartburn, dysphagia, nausea, abdominal pain, melena and abdominal distention.  Musculoskeletal: Negative for muscle weakness.       Objective:   Physical Exam  Constitutional: She is oriented to person, place, and time. She appears well-developed and well-nourished.  Cardiovascular: Normal rate and regular rhythm.   Pulmonary/Chest: Effort normal and breath sounds normal. No respiratory distress. She has no wheezes. She has no rales.  Abdominal: Soft. Bowel sounds are normal. She exhibits no distension. There is no tenderness. There is no rebound and no guarding.  Musculoskeletal: Normal range of motion. She exhibits  edema. She exhibits no tenderness.       Feet are slightly swollen No calf pain No LE edema  Neurological: She is alert and oriented to person, place, and time.  Skin: Skin is warm.  Psychiatric: She has a normal mood and affect. Her behavior is normal.          Assessment & Plan:

## 2010-09-18 NOTE — Assessment & Plan Note (Signed)
Check labs Increase K rich foods--list given to patient

## 2010-09-18 NOTE — Assessment & Plan Note (Signed)
Slightly elevated today Recheck 2 weeks--pt did not take med today yet

## 2010-09-18 NOTE — Patient Instructions (Signed)
Edema Edema is an abnormal build-up of fluids in tissues. Because this is partly dependent on gravity (water flows to the lowest place), it is more common in the lower extremities (legs and thighs). It is also common in the looser tissues, like around the eyes. Painless swelling of the feet and ankles is common and increases as a person ages. It may affect both legs and may include the calves or even thighs. When squeezed, the fluid may move out of the affected area and may leave a dent for a few moments. CAUSES  Prolonged standing or sitting in one place for extended periods of time. Movement helps pump tissue fluid into the veins, and absence of movement prevents this, resulting in edema.   Varicose veins. The valves in the veins do not work as well as they should. This causes fluid to leak into the tissues.   Fluid and salt overload.   Injury, burn, or surgery to the leg, ankle, or foot, may damage veins and allow fluid to leak out.   Sunburn damages vessels. Leaky vessels allow fluid to go out into the sunburned tissues.   Allergies (from insect bites or stings, medications or chemicals) cause swelling by allowing vessels to become leaky.   Protein in the blood helps keep fluid in your vessels. Low protein, as in malnutrition, allows fluid to leak out.   Hormonal changes, including pregnancy and menstruation, cause fluid retention. This fluid may leak out of vessels and cause edema.   Medications that cause fluid retention. Examples are sex hormones, blood pressure medications, steroid treatment, or anti-depressants.   Some illnesses cause edema, especially heart failure, kidney disease, or liver disease.   Surgery that cuts veins or lymph nodes, such as surgery done for the heart or for breast cancer, may result in edema.  DIAGNOSIS Your caregiver is usually easily able to determine what is causing your swelling (edema) by simply asking what is wrong (getting a history) and examining  you (doing a physical). Sometimes x-rays, EKG (electrocardiogram or heart tracing), and blood work may be done to evaluate for underlying medical illness. TREATMENT General treatment includes:  Leg elevation (or elevation of the affected body part).   Restriction of fluid intake.   Prevention of fluid overload.   Compression of the affected body part. Compression with elastic bandages or support stockings squeezes the tissues, preventing fluid from entering and forcing it back into the blood vessels.   Diuretics (also called water pills or fluid pills) pull fluid out of your body in the form of increased urination. These are effective in reducing the swelling, but can have side effects and must be used only under your caregiver's supervision. Diuretics are appropriate only for some types of edema.  The specific treatment can be directed at any underlying causes discovered. Heart, liver, or kidney disease should be treated appropriately. HOME CARE INSTRUCTIONS  Elevate the legs (or affected body part) above the level of the heart, while lying down.   Avoid sitting or standing still for prolonged periods of time.   Avoid putting anything directly under the knees when lying down, and do not wear constricting clothing or garters on the upper legs.   Exercising the legs causes the fluid to work back into the veins and lymphatic channels. This may help the swelling go down.   The pressure applied by elastic bandages or support stockings can help reduce ankle swelling.   A low-salt diet may help reduce fluid retention and decrease the   ankle swelling.   Take any medications exactly as prescribed.  SEEK MEDICAL CARE IF:  Your edema is not responding to recommended treatments.  SEEK IMMEDIATE MEDICAL CARE IF:  You develop shortness of breath or chest pain.   You cannot breathe when you lay down; or if, while lying down, you have to get up and go to the window to get your breath.   You are  having increasing swelling without relief from treatment.   You develop a fever over 100.4.   You develop pain or redness in the areas that are swollen.   Tell your caregiver right away if you have gained 1-2 lbs in 1 day or  5 lbs in a week.  MAKE SURE YOU:  Understand these instructions.   Will watch your condition.   Will get help right away if you are not doing well or get worse.  Document Released: 04/07/2005 Document Re-Released: 09/25/2009 [employer] Patient Information 2011 [employer]. 

## 2010-09-18 NOTE — Progress Notes (Signed)
Addended by: Legrand Como on: 09/18/2010 11:11 AM   Modules accepted: Orders

## 2010-09-20 LAB — URINE CULTURE: Colony Count: NO GROWTH

## 2010-09-25 ENCOUNTER — Encounter: Payer: Self-pay | Admitting: *Deleted

## 2010-10-02 ENCOUNTER — Ambulatory Visit (INDEPENDENT_AMBULATORY_CARE_PROVIDER_SITE_OTHER): Payer: Managed Care, Other (non HMO) | Admitting: Family Medicine

## 2010-10-02 ENCOUNTER — Encounter: Payer: Self-pay | Admitting: Family Medicine

## 2010-10-02 VITALS — BP 118/82 | HR 91 | Temp 99.0°F | Wt 265.0 lb

## 2010-10-02 DIAGNOSIS — R011 Cardiac murmur, unspecified: Secondary | ICD-10-CM

## 2010-10-02 DIAGNOSIS — I10 Essential (primary) hypertension: Secondary | ICD-10-CM

## 2010-10-02 DIAGNOSIS — R079 Chest pain, unspecified: Secondary | ICD-10-CM

## 2010-10-02 DIAGNOSIS — K219 Gastro-esophageal reflux disease without esophagitis: Secondary | ICD-10-CM

## 2010-10-02 LAB — BASIC METABOLIC PANEL
Calcium: 9.2 mg/dL (ref 8.4–10.5)
GFR: 120.44 mL/min (ref >60.00)
Glucose, Bld: 107 mg/dL — ABNORMAL HIGH (ref 70–99)
Sodium: 139 mEq/L (ref 135–145)

## 2010-10-02 NOTE — Patient Instructions (Signed)

## 2010-10-02 NOTE — Progress Notes (Signed)
  Subjective:    Patient here for follow-up of elevated blood pressure.  She is exercising and is adherent to a low-salt diet.  Blood pressure is well controlled at home. Cardiac symptoms: chest pain, dyspnea, palpitations and sob and palp only occassionally. Patient denies: exertional chest pressure/discomfort, fatigue, near-syncope, syncope and tachypnea. Cardiovascular risk factors: hypertension and obesity (BMI >= 30 kg/m2). Use of agents associated with hypertension: none. History of target organ damage: none.  The following portions of the patient's history were reviewed and updated as appropriate: allergies, current medications, past family history, past medical history, past social history, past surgical history and problem list.  Review of Systems Pertinent items are noted in HPI.     Objective:    BP 118/82  Pulse 91  Temp(Src) 99 F (37.2 C) (Oral)  Wt 265 lb (120.203 kg)  SpO2 99% General appearance: alert, cooperative, appears stated age and no distress Head: Normocephalic, without obvious abnormality, atraumatic Neck: no adenopathy, no carotid bruit, no JVD, supple, symmetrical, trachea midline and thyroid not enlarged, symmetric, no tenderness/mass/nodules Lungs: clear to auscultation bilaterally Heart: S1, S2 normal and sem  2/6 Extremities: extremities normal, atraumatic, no cyanosis or edema    Assessment:    Hypertension, normal blood pressure . Evidence of target organ damage: none.    Plan:    Medication: no change. Screening labs for initial evaluation: basic metabolic panel. Dietary sodium restriction. Regular aerobic exercise. Follow up: 3 months and as needed.

## 2010-10-03 ENCOUNTER — Encounter: Payer: Self-pay | Admitting: Family Medicine

## 2010-10-10 ENCOUNTER — Other Ambulatory Visit: Payer: Self-pay | Admitting: Family Medicine

## 2010-10-10 DIAGNOSIS — R7989 Other specified abnormal findings of blood chemistry: Secondary | ICD-10-CM

## 2010-10-11 ENCOUNTER — Other Ambulatory Visit: Payer: Self-pay | Admitting: Gynecology

## 2010-10-11 ENCOUNTER — Other Ambulatory Visit (INDEPENDENT_AMBULATORY_CARE_PROVIDER_SITE_OTHER): Payer: Managed Care, Other (non HMO)

## 2010-10-11 DIAGNOSIS — R7989 Other specified abnormal findings of blood chemistry: Secondary | ICD-10-CM

## 2010-10-11 DIAGNOSIS — Z1231 Encounter for screening mammogram for malignant neoplasm of breast: Secondary | ICD-10-CM

## 2010-10-11 LAB — HEPATIC FUNCTION PANEL
ALT: 17 U/L (ref 0–35)
Bilirubin, Direct: 0.1 mg/dL (ref 0.0–0.3)
Total Bilirubin: 0.3 mg/dL (ref 0.3–1.2)

## 2010-10-11 LAB — BASIC METABOLIC PANEL
Calcium: 9.1 mg/dL (ref 8.4–10.5)
Chloride: 105 mEq/L (ref 96–112)
Creatinine, Ser: 0.7 mg/dL (ref 0.4–1.2)
Sodium: 141 mEq/L (ref 135–145)

## 2010-10-14 NOTE — Progress Notes (Signed)
Labs only

## 2010-10-15 ENCOUNTER — Other Ambulatory Visit (HOSPITAL_COMMUNITY): Payer: Managed Care, Other (non HMO) | Admitting: Radiology

## 2010-10-16 ENCOUNTER — Telehealth: Payer: Self-pay

## 2010-10-16 ENCOUNTER — Ambulatory Visit (HOSPITAL_COMMUNITY): Payer: Managed Care, Other (non HMO) | Attending: Family Medicine | Admitting: Radiology

## 2010-10-16 DIAGNOSIS — F17200 Nicotine dependence, unspecified, uncomplicated: Secondary | ICD-10-CM | POA: Insufficient documentation

## 2010-10-16 DIAGNOSIS — I079 Rheumatic tricuspid valve disease, unspecified: Secondary | ICD-10-CM | POA: Insufficient documentation

## 2010-10-16 DIAGNOSIS — I10 Essential (primary) hypertension: Secondary | ICD-10-CM | POA: Insufficient documentation

## 2010-10-16 DIAGNOSIS — E785 Hyperlipidemia, unspecified: Secondary | ICD-10-CM | POA: Insufficient documentation

## 2010-10-16 DIAGNOSIS — I059 Rheumatic mitral valve disease, unspecified: Secondary | ICD-10-CM | POA: Insufficient documentation

## 2010-10-16 DIAGNOSIS — R011 Cardiac murmur, unspecified: Secondary | ICD-10-CM | POA: Insufficient documentation

## 2010-10-16 NOTE — Telephone Encounter (Signed)
Message copied by Arnette Norris on Wed Oct 16, 2010  3:54 PM ------      Message from: Lelon Perla      Created: Wed Oct 16, 2010  2:54 PM       Potassium low-----kcl  20 meq  #30  1 po qd  ----- recheck 2 weeks----bmp 276.8

## 2010-10-16 NOTE — Telephone Encounter (Signed)
mssg left to call back--copy of labs mailed    KP

## 2010-10-17 MED ORDER — POTASSIUM CHLORIDE 20 MEQ PO PACK: 20.0000 meq | PACK | Freq: Every day | ORAL | Status: DC

## 2010-10-17 NOTE — Telephone Encounter (Signed)
Call from patient---- Discussed Lab results, Rx called to [facility] [address] and appt scheduled     KP

## 2010-10-25 ENCOUNTER — Other Ambulatory Visit: Payer: Managed Care, Other (non HMO)

## 2010-10-29 ENCOUNTER — Other Ambulatory Visit: Payer: Self-pay | Admitting: Family Medicine

## 2010-10-29 DIAGNOSIS — E876 Hypokalemia: Secondary | ICD-10-CM

## 2010-10-29 DIAGNOSIS — E785 Hyperlipidemia, unspecified: Secondary | ICD-10-CM

## 2010-10-30 ENCOUNTER — Other Ambulatory Visit (INDEPENDENT_AMBULATORY_CARE_PROVIDER_SITE_OTHER): Payer: Managed Care, Other (non HMO)

## 2010-10-30 DIAGNOSIS — E876 Hypokalemia: Secondary | ICD-10-CM

## 2010-10-30 DIAGNOSIS — E785 Hyperlipidemia, unspecified: Secondary | ICD-10-CM

## 2010-10-30 LAB — BASIC METABOLIC PANEL
BUN: 11 mg/dL (ref 6–23)
Chloride: 106 mEq/L (ref 96–112)
Creatinine, Ser: 0.7 mg/dL (ref 0.4–1.2)
Glucose, Bld: 106 mg/dL — ABNORMAL HIGH (ref 70–99)

## 2010-10-30 LAB — HEPATIC FUNCTION PANEL
Alkaline Phosphatase: 50 U/L (ref 39–117)
Bilirubin, Direct: 0.1 mg/dL (ref 0.0–0.3)
Total Bilirubin: 0.6 mg/dL (ref 0.3–1.2)
Total Protein: 7.5 g/dL (ref 6.0–8.3)

## 2010-10-30 LAB — LIPID PANEL
Cholesterol: 173 mg/dL (ref 0–200)
LDL Cholesterol: 110 mg/dL — ABNORMAL HIGH (ref 0–99)

## 2010-10-30 NOTE — Progress Notes (Signed)
Labs only

## 2010-10-30 NOTE — Progress Notes (Signed)
12  

## 2010-11-04 ENCOUNTER — Encounter: Payer: Self-pay | Admitting: *Deleted

## 2010-11-05 ENCOUNTER — Telehealth: Payer: Self-pay | Admitting: *Deleted

## 2010-11-05 NOTE — Telephone Encounter (Signed)
Message copied by Verdene Rio on Tue Nov 05, 2010 11:28 AM ------      Message from: Lelon Perla      Created: Sun Nov 03, 2010  8:06 PM       Cholesterol--- LDL goal < 100,  HDL >40,  TG < 150.  Diet and exercise will increase HDL and decrease LDL and TG.  Fish,  Fish Oil, Flaxseed oil will also help increase the HDL and decrease Triglycerides.   Recheck labs in 3 months.  Increase livalo to 4 mg 1 po qhs  #30  2 refills----272.4  Lipid, hep

## 2010-11-05 NOTE — Telephone Encounter (Signed)
Pt return call Left message to call office.  

## 2010-11-06 NOTE — Telephone Encounter (Signed)
Pt states that she does not want to increase to 4 mg due to 2 mg already causing some muscle spasm. Pt notes that she is taking coq10 but is still have muscle spasm periodically. Please advise

## 2010-11-06 NOTE — Telephone Encounter (Signed)
Left message to call office

## 2010-11-07 NOTE — Telephone Encounter (Signed)
Discuss with patient.  Pt to bring in bottle of med called replenex so that Dr Laury Axon can review lable to make sure med is safe to take along with her other meds.

## 2010-11-07 NOTE — Telephone Encounter (Signed)
She is not at goal but I can't make her take more

## 2010-11-11 ENCOUNTER — Ambulatory Visit
Admission: RE | Admit: 2010-11-11 | Discharge: 2010-11-11 | Disposition: A | Discharge status: Home / Self Care | Payer: Managed Care, Other (non HMO) | Source: Ambulatory Visit | Attending: Gynecology | Admitting: Gynecology

## 2010-11-11 DIAGNOSIS — Z1231 Encounter for screening mammogram for malignant neoplasm of breast: Secondary | ICD-10-CM

## 2010-11-12 ENCOUNTER — Other Ambulatory Visit: Payer: Self-pay | Admitting: Gynecology

## 2010-11-12 DIAGNOSIS — R928 Other abnormal and inconclusive findings on diagnostic imaging of breast: Secondary | ICD-10-CM

## 2010-11-13 ENCOUNTER — Telehealth: Payer: Self-pay | Admitting: *Deleted

## 2010-11-13 NOTE — Telephone Encounter (Signed)
CALL PT AND LM FOR THE BELOW.

## 2010-11-13 NOTE — Progress Notes (Signed)
CALL PT AND LM TO REMIND PT TO FOLLOW UP WITH UP FOR FURTHER IMAGING OF RT BREAST.

## 2010-11-13 NOTE — Telephone Encounter (Signed)
Message copied by Aura Camps on Wed Nov 13, 2010 10:35 AM ------      Message from: Dara Lords      Created: Tue Nov 12, 2010  5:48 PM       Call patient and make sure she follows up for further imaging of her right breast

## 2010-11-14 ENCOUNTER — Other Ambulatory Visit: Payer: Self-pay | Admitting: *Deleted

## 2010-11-14 DIAGNOSIS — N6310 Unspecified lump in the right breast, unspecified quadrant: Secondary | ICD-10-CM

## 2010-11-15 ENCOUNTER — Ambulatory Visit: Payer: Managed Care, Other (non HMO) | Admitting: Gastroenterology

## 2010-11-15 ENCOUNTER — Ambulatory Visit (INDEPENDENT_AMBULATORY_CARE_PROVIDER_SITE_OTHER): Payer: Managed Care, Other (non HMO) | Admitting: Gastroenterology

## 2010-11-15 ENCOUNTER — Encounter: Payer: Self-pay | Admitting: Gastroenterology

## 2010-11-15 VITALS — BP 132/70 | HR 94 | Ht 64.0 in | Wt 264.0 lb

## 2010-11-15 DIAGNOSIS — R142 Eructation: Secondary | ICD-10-CM

## 2010-11-15 DIAGNOSIS — R141 Gas pain: Secondary | ICD-10-CM

## 2010-11-15 DIAGNOSIS — K219 Gastro-esophageal reflux disease without esophagitis: Secondary | ICD-10-CM

## 2010-11-15 NOTE — Progress Notes (Signed)
Review of pertinent gastrointestinal problems: 1. dyspepsia, GERD-like. It was recommended by me to have upper endoscopy 2011 but she never scheduled this. 2. morbid obesity BMI 40s   HPI: This is a very pleasant 45 year old woman whom I last saw about a year ago. She started taking proton pump inhibitors as I had recommended and her belching symptoms improved and so she never had her upper endoscopy. The belching symptoms returned recently.  She presses on her body in many different locations and will belch.  Tries to take prilosec in AM.  Still takes gas-ex and tums PRN.  Also has pyrosis at times, acid taste at times.  Sleeps sitting up in bed.   Past Medical History:   GERD (gastroesophageal reflux disease)                       Hypertension                                                Past Surgical History:   CESAREAN SECTION                                1995         ABDOMINAL HYSTERECTOMY                          1998           Comment:partial   reports that she has been smoking Cigarettes.  She has a 10 pack-year smoking history. She has never used smokeless tobacco. She reports that she drinks about .6 ounces of alcohol per week. She reports that she does not use illicit drugs.  family history includes Aneurysm in her mother; Arthritis in her brother; Cancer (age of onset:34) in her father; Coronary artery disease in her mother; Diabetes in her brother and sister; Heart disease in her mother; Hypertension in her brothers and mother; Kidney disease (age of onset:69) in her mother; and Stroke in her mother.    Current medicines and allergies were reviewed in [facility]    Physical Exam: BP 132/70  Pulse 94  Ht 5\' 4"  (1.626 m)  Wt 264 lb (119.75 kg)  BMI 45.32 kg/m2 Constitutional: generally well-appearing Psychiatric: alert and oriented x3 Abdomen: soft, nontender, nondistended, no obvious ascites, no peritoneal signs, normal bowel sounds     Assessment and  plan: 45 y.o. female with pyrosis, belching  It is very unusual for belching to be caused by pressing on different body parts such as arm, legs. She clearly is belching a lot however and does have some more common GERD symptoms and so we will proceed with EGD as I had recommended a year ago.

## 2010-11-15 NOTE — Patient Instructions (Addendum)
One of your biggest health concerns is your smoking.  This increases your risk for most cancers and serious cardiovascular diseases such as strokes, heart attacks.  You should try your best to stop.  If you need assistence, please contact your PCP or Smoking Cessation Class at [facility] [phone]) or [facility] Quit-Line (1-800-QUIT-NOW). You will be set up for an upper endoscopy for belching. A copy of this information will be made available to Dr. Laury Axon

## 2010-11-19 ENCOUNTER — Encounter: Payer: Self-pay | Admitting: Gastroenterology

## 2010-11-19 ENCOUNTER — Ambulatory Visit (AMBULATORY_SURGERY_CENTER): Payer: Managed Care, Other (non HMO) | Admitting: Gastroenterology

## 2010-11-19 VITALS — BP 112/68 | HR 74 | Temp 99.1°F | Resp 16 | Ht 64.0 in | Wt 264.0 lb

## 2010-11-19 DIAGNOSIS — K219 Gastro-esophageal reflux disease without esophagitis: Secondary | ICD-10-CM

## 2010-11-19 DIAGNOSIS — D1330 Benign neoplasm of unspecified part of small intestine: Secondary | ICD-10-CM

## 2010-11-19 DIAGNOSIS — K2970 Gastritis, unspecified, without bleeding: Secondary | ICD-10-CM

## 2010-11-19 DIAGNOSIS — K319 Disease of stomach and duodenum, unspecified: Secondary | ICD-10-CM

## 2010-11-19 DIAGNOSIS — K2990 Gastroduodenitis, unspecified, without bleeding: Secondary | ICD-10-CM

## 2010-11-19 DIAGNOSIS — R1013 Epigastric pain: Secondary | ICD-10-CM

## 2010-11-19 MED ORDER — SODIUM CHLORIDE 0.9 % IV SOLN: 500.0000 mL | INTRAVENOUS | Status: DC

## 2010-11-19 NOTE — Patient Instructions (Signed)
Please read the handouts given to you by your recovery room nurse.  Your biopsy results will be mailed to you within 2 weeks.   You may resume your routine medications today.  If you have any questions, please call us at [phone]. Thank-you.

## 2010-11-20 ENCOUNTER — Ambulatory Visit
Admission: RE | Admit: 2010-11-20 | Discharge: 2010-11-20 | Disposition: A | Discharge status: Home / Self Care | Payer: Managed Care, Other (non HMO) | Source: Ambulatory Visit | Attending: Gynecology | Admitting: Gynecology

## 2010-11-20 ENCOUNTER — Telehealth: Payer: Self-pay | Admitting: *Deleted

## 2010-11-20 DIAGNOSIS — R928 Other abnormal and inconclusive findings on diagnostic imaging of breast: Secondary | ICD-10-CM

## 2010-11-20 NOTE — Telephone Encounter (Signed)

## 2010-11-21 ENCOUNTER — Other Ambulatory Visit: Payer: Self-pay | Admitting: Gastroenterology

## 2010-11-21 DIAGNOSIS — R221 Localized swelling, mass and lump, neck: Secondary | ICD-10-CM

## 2010-11-21 NOTE — Progress Notes (Signed)
Pt aware of the appointment and instructions, she was also given the address and phone number.

## 2010-11-25 ENCOUNTER — Telehealth: Payer: Self-pay

## 2010-11-25 DIAGNOSIS — R221 Localized swelling, mass and lump, neck: Secondary | ICD-10-CM

## 2010-11-25 NOTE — Telephone Encounter (Signed)
Left message on machine to call back regarding [place] [facility] 11/29/10 arrive 145 pm no prep

## 2010-11-26 ENCOUNTER — Other Ambulatory Visit: Payer: Self-pay | Admitting: *Deleted

## 2010-11-26 DIAGNOSIS — N63 Unspecified lump in unspecified breast: Secondary | ICD-10-CM

## 2010-11-26 NOTE — Telephone Encounter (Signed)
Checked with Morrie Sheldon who got the denial from the insurance company. Morrie Sheldon states you don't need a pre cert for an u/s, but the pay is based on medical necessity. Pt is afraid her insurance won't pay, so she wants me to cancel the u/s and she will check with her insurance and call back. Cancelled u/s for 11/29/10 at 2pm with Helmut Muster.

## 2010-11-26 NOTE — Telephone Encounter (Signed)
i don't think plain films would be very helpful, should have [place]

## 2010-11-26 NOTE — Telephone Encounter (Signed)
Pt's CT was cancelled because insurance wouldn't pay for it and an U/S was scheduled.  Dr Christella Hartigan, pt wants to know if a "regular xray" could be done instead? Thanks.

## 2010-11-28 ENCOUNTER — Other Ambulatory Visit: Payer: Self-pay | Admitting: Interventional Cardiology

## 2010-11-29 ENCOUNTER — Other Ambulatory Visit (HOSPITAL_COMMUNITY): Payer: Managed Care, Other (non HMO)

## 2010-12-03 ENCOUNTER — Ambulatory Visit: Payer: Managed Care, Other (non HMO) | Admitting: Gastroenterology

## 2010-12-04 ENCOUNTER — Inpatient Hospital Stay: Admission: RE | Admit: 2010-12-04 | Payer: Managed Care, Other (non HMO) | Source: Ambulatory Visit

## 2011-01-29 ENCOUNTER — Encounter: Payer: Self-pay | Admitting: Family Medicine

## 2011-01-29 ENCOUNTER — Ambulatory Visit (INDEPENDENT_AMBULATORY_CARE_PROVIDER_SITE_OTHER): Payer: Managed Care, Other (non HMO) | Admitting: Family Medicine

## 2011-01-29 ENCOUNTER — Ambulatory Visit: Payer: Managed Care, Other (non HMO) | Admitting: Family Medicine

## 2011-01-29 DIAGNOSIS — K219 Gastro-esophageal reflux disease without esophagitis: Secondary | ICD-10-CM

## 2011-01-29 DIAGNOSIS — I10 Essential (primary) hypertension: Secondary | ICD-10-CM

## 2011-01-29 DIAGNOSIS — E785 Hyperlipidemia, unspecified: Secondary | ICD-10-CM

## 2011-01-29 DIAGNOSIS — N390 Urinary tract infection, site not specified: Secondary | ICD-10-CM

## 2011-01-29 LAB — POCT URINALYSIS DIPSTICK
Nitrite, UA: POSITIVE
Protein, UA: NEGATIVE
Spec Grav, UA: 1.005
Urobilinogen, UA: 0.2

## 2011-01-29 LAB — BASIC METABOLIC PANEL
BUN: 9 mg/dL (ref 6–23)
Chloride: 105 mEq/L (ref 96–112)
GFR: 124.48 mL/min (ref >60.00)
Potassium: 3.4 mEq/L — ABNORMAL LOW (ref 3.5–5.1)
Sodium: 141 mEq/L (ref 135–145)

## 2011-01-29 LAB — LIPID PANEL
Cholesterol: 180 mg/dL (ref 0–200)
LDL Cholesterol: 112 mg/dL — ABNORMAL HIGH (ref 0–99)
Total CHOL/HDL Ratio: 4
VLDL: 20.8 mg/dL (ref 0.0–40.0)

## 2011-01-29 LAB — HEPATIC FUNCTION PANEL
ALT: 19 U/L (ref 0–35)
AST: 18 U/L (ref 0–37)
Bilirubin, Direct: 0 mg/dL (ref 0.0–0.3)
Total Bilirubin: 0.4 mg/dL (ref 0.3–1.2)

## 2011-01-29 MED ORDER — ESOMEPRAZOLE MAGNESIUM 40 MG PO CPDR: 40.0000 mg | DELAYED_RELEASE_CAPSULE | Freq: Every day | ORAL | Status: DC

## 2011-01-29 NOTE — Progress Notes (Signed)
Addended by: Legrand Como on: 01/29/2011 01:41 PM   Modules accepted: Orders

## 2011-01-29 NOTE — Patient Instructions (Signed)

## 2011-01-29 NOTE — Progress Notes (Signed)
  Subjective:    Patient here for follow-up of elevated blood pressure.  She is exercising and is adherent to a low-salt diet.  Blood pressure is well controlled at home. Cardiac symptoms: none. Patient denies: chest pain, chest pressure/discomfort, claudication, dyspnea, exertional chest pressure/discomfort, fatigue, irregular heart beat, lower extremity edema, near-syncope, orthopnea, palpitations, paroxysmal nocturnal dyspnea, syncope and tachypnea. Cardiovascular risk factors: dyslipidemia, hypertension and obesity (BMI >= 30 kg/m2). Use of agents associated with hypertension: none. History of target organ damage: none. + increase in gerd symptoms and gas.  Pt is taking mylicon and prilosec. The following portions of the patient's history were reviewed and updated as appropriate: allergies, current medications, past family history, past medical history, past social history, past surgical history and problem list.  Review of Systems Pertinent items are noted in HPI.     Objective:    BP 132/88  Pulse 76  Temp(Src) 99.1 F (37.3 C) (Oral)  Wt 266 lb (120.657 kg)  SpO2 98% General appearance: alert, cooperative, appears stated age and no distress Neck: no adenopathy, no carotid bruit, no JVD, supple, symmetrical, trachea midline and thyroid not enlarged, symmetric, no tenderness/mass/nodules Lungs: clear to auscultation bilaterally Heart: regular rate and rhythm, S1, S2 normal, no murmur, click, rub or gallop Abdomen: soft, non-tender; bowel sounds normal; no masses,  no organomegaly    Assessment:    Hypertension, normal blood pressure . Evidence of target organ damage: none.   GERD---  Change to nexium , cont mylicon ,  F/u GI Plan:    Medication: no change. Dietary sodium restriction. Regular aerobic exercise. Check blood pressures 2-3 times weekly and record. Follow up: 6 months and as needed.

## 2011-01-31 LAB — URINE CULTURE

## 2011-02-04 ENCOUNTER — Encounter: Payer: Self-pay | Admitting: *Deleted

## 2011-02-18 ENCOUNTER — Other Ambulatory Visit: Payer: Self-pay | Admitting: Family Medicine

## 2011-02-19 MED ORDER — POTASSIUM CHLORIDE CRYS ER 20 MEQ PO TBCR: 40.0000 meq | EXTENDED_RELEASE_TABLET | Freq: Every day | ORAL | Status: DC

## 2011-02-19 NOTE — Telephone Encounter (Signed)
Pt would like to know if it is ok to change from powder to tablets and if quantity can be increase since she is to double up on med.Please advise

## 2011-02-19 NOTE — Telephone Encounter (Signed)
Ok to change to tab and double quantity

## 2011-02-19 NOTE — Telephone Encounter (Signed)
Rx changed and sent to pharmacy, Left Pt detail message of the following.

## 2011-02-26 ENCOUNTER — Other Ambulatory Visit: Payer: Self-pay | Admitting: Family Medicine

## 2011-02-26 MED ORDER — BENAZEPRIL HCL 10 MG PO TABS: 10.0000 mg | ORAL_TABLET | Freq: Every day | ORAL | Status: DC

## 2011-02-26 NOTE — Telephone Encounter (Signed)
Faxed     Kp 

## 2011-03-19 MED ORDER — BENAZEPRIL HCL 10 MG PO TABS: 10.0000 mg | ORAL_TABLET | Freq: Every day | ORAL | Status: DC

## 2011-03-19 NOTE — Telephone Encounter (Signed)
Pt called back stating that Rx was sent to wrong pharmacy. Rx resent to correct pharmacy Pt aware.

## 2011-03-19 NOTE — Telephone Encounter (Signed)
Addended by: Candie Echevaria L on: 03/19/2011 09:25 AM   Modules accepted: Orders

## 2011-04-11 ENCOUNTER — Encounter: Payer: Self-pay | Admitting: Family Medicine

## 2011-04-11 ENCOUNTER — Ambulatory Visit (INDEPENDENT_AMBULATORY_CARE_PROVIDER_SITE_OTHER): Payer: Managed Care, Other (non HMO) | Admitting: Family Medicine

## 2011-04-11 VITALS — BP 115/70 | HR 85 | Temp 98.8°F | Ht 63.0 in | Wt 265.6 lb

## 2011-04-11 DIAGNOSIS — J329 Chronic sinusitis, unspecified: Secondary | ICD-10-CM

## 2011-04-11 MED ORDER — FLUCONAZOLE 150 MG PO TABS: 150.0000 mg | ORAL_TABLET | Freq: Once | ORAL | Status: AC

## 2011-04-11 MED ORDER — CLARITHROMYCIN ER 500 MG PO TB24: 1000.0000 mg | ORAL_TABLET | Freq: Every day | ORAL | Status: AC

## 2011-04-11 NOTE — Assessment & Plan Note (Signed)
L frontal sinus infxn.  Start abx.  Reviewed supportive care and red flags that should prompt return.  Pt expressed understanding and is in agreement w/ plan.

## 2011-04-11 NOTE — Patient Instructions (Signed)
You have a sinus infection Take the Biaxin- 2 tabs at the same time daily- w/ food Drink plenty of fluids REST! Continue the mucinex to thin your congestion Call with any questions or concerns Hang in there! Happy Holidays!!!

## 2011-04-11 NOTE — Progress Notes (Signed)
  Subjective:    Patient ID: Tiffany Gallagher, female    DOB: 12-01-1965, 45 y.o.   MRN: [id number]  HPI URI- sxs started 10-14 days ago w/ 'my sinuses clogging up'.  Having L ear pain, facial pain.  + nasal congestion, PND.  + dizziness.  Minimal cough.  Subjective fevers.  + sick contacts.   Review of Systems For ROS see HPI     Objective:   Physical Exam  Vitals reviewed. Constitutional: She appears well-developed and well-nourished. No distress.  HENT:  Head: Normocephalic and atraumatic.  Right Ear: Tympanic membrane normal.  Left Ear: Tympanic membrane normal.  Nose: Mucosal edema and rhinorrhea present. Right sinus exhibits no maxillary sinus tenderness and no frontal sinus tenderness. Left sinus exhibits frontal sinus tenderness. Left sinus exhibits no maxillary sinus tenderness.  Mouth/Throat: Uvula is midline and mucous membranes are normal. Posterior oropharyngeal erythema present. No oropharyngeal exudate.  Eyes: Conjunctivae and EOM are normal. Pupils are equal, round, and reactive to light.  Neck: Normal range of motion. Neck supple.  Cardiovascular: Normal rate, regular rhythm and normal heart sounds.   Pulmonary/Chest: Effort normal and breath sounds normal. No respiratory distress. She has no wheezes.  Lymphadenopathy:    She has no cervical adenopathy.          Assessment & Plan:

## 2011-04-29 ENCOUNTER — Telehealth: Payer: Self-pay | Admitting: Family Medicine

## 2011-04-29 DIAGNOSIS — I455 Other specified heart block: Secondary | ICD-10-CM

## 2011-04-29 DIAGNOSIS — J329 Chronic sinusitis, unspecified: Secondary | ICD-10-CM

## 2011-04-29 NOTE — Telephone Encounter (Signed)
Pt states that she is not feeling any better and would like to be referred to ENT

## 2011-04-29 NOTE — Telephone Encounter (Signed)
Please advise      KP 

## 2011-04-29 NOTE — Telephone Encounter (Signed)
Ok to put in referral?  

## 2011-06-13 DIAGNOSIS — F17200 Nicotine dependence, unspecified, uncomplicated: Secondary | ICD-10-CM | POA: Insufficient documentation

## 2011-06-13 DIAGNOSIS — G43909 Migraine, unspecified, not intractable, without status migrainosus: Secondary | ICD-10-CM | POA: Insufficient documentation

## 2011-06-13 DIAGNOSIS — D219 Benign neoplasm of connective and other soft tissue, unspecified: Secondary | ICD-10-CM | POA: Insufficient documentation

## 2011-06-18 ENCOUNTER — Ambulatory Visit (INDEPENDENT_AMBULATORY_CARE_PROVIDER_SITE_OTHER): Payer: Managed Care, Other (non HMO) | Admitting: Women's Health

## 2011-06-18 ENCOUNTER — Encounter: Payer: Self-pay | Admitting: Women's Health

## 2011-06-18 VITALS — BP 126/80 | Ht 64.0 in | Wt 264.0 lb

## 2011-06-18 DIAGNOSIS — N898 Other specified noninflammatory disorders of vagina: Secondary | ICD-10-CM

## 2011-06-18 DIAGNOSIS — Z01419 Encounter for gynecological examination (general) (routine) without abnormal findings: Secondary | ICD-10-CM

## 2011-06-18 LAB — WET PREP FOR TRICH, YEAST, CLUE
Trich, Wet Prep: NONE SEEN
Yeast Wet Prep HPF POC: NONE SEEN

## 2011-06-18 NOTE — Progress Notes (Signed)
Tiffany Gallagher [birth year] [id number]    History:    The patient presents for annual exam.  TVH for fibroids. Complaint of vaginal discharge. History of normal Paps and mammograms.   Past medical history, past surgical history, family history and social history were all reviewed and documented in the EPIC chart. Hypertension treated by primary care. 46 year old son Penni Bombard having some behavior issues.  ROS:  A  ROS was performed and pertinent positives and negatives are included in the history.  Exam:  Filed Vitals:   06/18/11 0816  BP: 126/80    General appearance:  Normal Head/Neck:  Normal, without cervical or supraclavicular adenopathy. Thyroid:  Symmetrical, normal in size, without palpable masses or nodularity. Respiratory  Effort:  Normal  Auscultation:  Clear without wheezing or rhonchi Cardiovascular  Auscultation:  Regular rate, without rubs, murmurs or gallops  Edema/varicosities:  Not grossly evident Abdominal  Soft,nontender, without masses, guarding or rebound.  Liver/spleen:  No organomegaly noted  Hernia:  None appreciated  Skin  Inspection:  Grossly normal  Palpation:  Grossly normal Neurologic/psychiatric  Orientation:  Normal with appropriate conversation.  Mood/affect:  Normal  Genitourinary    Breasts: Examined lying and sitting.     Right: Without masses, retractions, discharge or axillary adenopathy.     Left: Without masses, retractions, discharge or axillary adenopathy.   Inguinal/mons:  Normal without inguinal adenopathy  External genitalia:  Normal  BUS/Urethra/Skene's glands:  Normal  Bladder:  Normal  Vagina:  Normal  Cervix:  Absent             Uterus:  Absent  Adnexa/parametria:     Rt: Without masses or tenderness.   Lt: Without masses or tenderness.  Anus and perineum: Normal  Digital rectal exam: Normal sphincter tone without palpated masses or tenderness  Assessment/Plan:  46 y.o. SBF G2 P1  for annual exam.   Normal GYN  exam Smoker-half pack per day Obese Hypertension-primary care meds and labs  Plan: Wet prep negative, vaginal discharge appeared normal. SBE's, annual mammogram, calcium rich diet, reviewed importance of increasing exercise decreasing calories for weight loss encouraged. Is aware of hazards of smoking reviewed Chantix. Will review with primary care.  Harrington Challenger WHNP, 1:12 PM 06/18/2011

## 2011-10-31 ENCOUNTER — Other Ambulatory Visit: Payer: Self-pay | Admitting: Family Medicine

## 2011-10-31 MED ORDER — BENAZEPRIL HCL 10 MG PO TABS: 10.0000 mg | ORAL_TABLET | Freq: Every day | ORAL | Status: DC

## 2011-10-31 MED ORDER — OMEPRAZOLE 20 MG PO CPDR: 20.0000 mg | DELAYED_RELEASE_CAPSULE | Freq: Every day | ORAL | Status: DC

## 2011-10-31 MED ORDER — HYDROCHLOROTHIAZIDE 25 MG PO TABS: 12.5000 mg | ORAL_TABLET | ORAL | Status: DC

## 2011-10-31 NOTE — Telephone Encounter (Signed)
Refills x 3 last ov 12.21.12  1-LOTENSIN 10 MG Take 1 tablet (10 mg total) by mouth daily., last wrt 11.28.12 #90 wt 1-refill  2-PRILOSEC 20 MG Take 20 mg by mouth daily., #90 wt/3-refills last wrt 5.30.12  3-Hydrochlorothiazide (Tab) 25 MG Take 1 tablet (25 mg total) by mouth as directed. 1/2 by mouth once daily. #100 wt/1-refill last wrt 5.30.12

## 2011-11-19 ENCOUNTER — Other Ambulatory Visit: Payer: Self-pay | Admitting: Gynecology

## 2011-11-19 DIAGNOSIS — Z1231 Encounter for screening mammogram for malignant neoplasm of breast: Secondary | ICD-10-CM

## 2011-11-28 ENCOUNTER — Ambulatory Visit
Admission: RE | Admit: 2011-11-28 | Discharge: 2011-11-28 | Disposition: A | Discharge status: Home / Self Care | Payer: Managed Care, Other (non HMO) | Source: Ambulatory Visit | Attending: Gynecology | Admitting: Gynecology

## 2011-11-28 DIAGNOSIS — Z1231 Encounter for screening mammogram for malignant neoplasm of breast: Secondary | ICD-10-CM

## 2012-01-16 ENCOUNTER — Ambulatory Visit (INDEPENDENT_AMBULATORY_CARE_PROVIDER_SITE_OTHER): Payer: Managed Care, Other (non HMO) | Admitting: Family Medicine

## 2012-01-16 ENCOUNTER — Encounter: Payer: Self-pay | Admitting: Family Medicine

## 2012-01-16 VITALS — BP 134/80 | HR 77 | Temp 98.6°F | Ht 65.5 in | Wt 277.2 lb

## 2012-01-16 DIAGNOSIS — I10 Essential (primary) hypertension: Secondary | ICD-10-CM

## 2012-01-16 DIAGNOSIS — Z0000 Encounter for general adult medical examination without abnormal findings: Secondary | ICD-10-CM

## 2012-01-16 DIAGNOSIS — R319 Hematuria, unspecified: Secondary | ICD-10-CM

## 2012-01-16 DIAGNOSIS — E785 Hyperlipidemia, unspecified: Secondary | ICD-10-CM

## 2012-01-16 DIAGNOSIS — R609 Edema, unspecified: Secondary | ICD-10-CM

## 2012-01-16 DIAGNOSIS — K219 Gastro-esophageal reflux disease without esophagitis: Secondary | ICD-10-CM

## 2012-01-16 LAB — BASIC METABOLIC PANEL
BUN: 11 mg/dL (ref 6–23)
CO2: 27 mEq/L (ref 19–32)
Chloride: 104 mEq/L (ref 96–112)
Creatinine, Ser: 0.7 mg/dL (ref 0.4–1.2)
Glucose, Bld: 90 mg/dL (ref 70–99)

## 2012-01-16 LAB — POCT URINALYSIS DIPSTICK
Leukocytes, UA: NEGATIVE
Nitrite, UA: NEGATIVE
Protein, UA: NEGATIVE
pH, UA: 7

## 2012-01-16 LAB — CBC WITH DIFFERENTIAL/PLATELET
Basophils Absolute: 0.1 K/uL (ref 0.0–0.1)
Basophils Relative: 1.1 % (ref 0.0–3.0)
Eosinophils Absolute: 0.1 K/uL (ref 0.0–0.7)
Eosinophils Relative: 2.2 % (ref 0.0–5.0)
HCT: 40.2 % (ref 36.0–46.0)
Hemoglobin: 13.1 g/dL (ref 12.0–15.0)
Lymphocytes Relative: 44.8 % (ref 12.0–46.0)
Lymphs Abs: 2.7 K/uL (ref 0.7–4.0)
MCHC: 32.6 g/dL (ref 30.0–36.0)
MCV: 97 fl (ref 78.0–100.0)
Monocytes Absolute: 0.4 K/uL (ref 0.1–1.0)
Monocytes Relative: 6.4 % (ref 3.0–12.0)
Neutro Abs: 2.7 K/uL (ref 1.4–7.7)
Neutrophils Relative %: 45.5 % (ref 43.0–77.0)
Platelets: 313 K/uL (ref 150.0–400.0)
RBC: 4.15 Mil/uL (ref 3.87–5.11)
RDW: 13.9 % (ref 11.5–14.6)
WBC: 6 K/uL (ref 4.5–10.5)

## 2012-01-16 LAB — TSH: TSH: 1.58 u[IU]/mL (ref 0.35–5.50)

## 2012-01-16 LAB — LIPID PANEL
HDL: 45.2 mg/dL (ref >39.00)
Triglycerides: 98 mg/dL (ref 0.0–149.0)

## 2012-01-16 LAB — HEPATIC FUNCTION PANEL
Bilirubin, Direct: 0.1 mg/dL (ref 0.0–0.3)
Total Protein: 7.6 g/dL (ref 6.0–8.3)

## 2012-01-16 MED ORDER — HYDROCHLOROTHIAZIDE 25 MG PO TABS: ORAL_TABLET | ORAL | Status: DC

## 2012-01-16 NOTE — Assessment & Plan Note (Signed)
Stable,  Cont meds 

## 2012-01-16 NOTE — Assessment & Plan Note (Signed)
Stable con't meds 

## 2012-01-16 NOTE — Progress Notes (Signed)
Subjective:     Tiffany Gallagher is a 46 y.o. female and is here for a comprehensive physical exam. The patient reports no problems.  Pt asking about getting getting cardiac ct.     History   Social History  . Marital Status: Single    Spouse Name: N/A    Number of Children: 1  . Years of Education: 17   Occupational History  . customer service     Social History Main Topics  . Smoking status: Current Every Day Smoker -- 0.8 packs/day for 20 years    Types: Cigarettes  . Smokeless tobacco: Never Used   Comment: cutting back  . Alcohol Use: 0.0 oz/week     less than 1 glass a week  . Drug Use: No  . Sexually Active: Yes -- Female partner(s)    Birth Control/ Protection: Surgical, Condom   Other Topics Concern  . Not on file   Social History Narrative   Exercise-- walking, abs dvd   Health Maintenance  Topic Date Due  . Influenza Vaccine  12/21/2011  . Tetanus/tdap  02/06/2018    The following portions of the patient's history were reviewed and updated as appropriate: allergies, current medications, past family history, past medical history, past social history, past surgical history and problem list.  Review of Systems Review of Systems  Constitutional: Negative for activity change, appetite change and fatigue.  HENT: Negative for hearing loss, congestion, tinnitus and ear discharge.  dentist q51m Eyes: Negative for visual disturbance (see optho q1y -- vision corrected to 20/20 with glasses).  Respiratory: Negative for cough, chest tightness and shortness of breath.   Cardiovascular: Negative for chest pain, palpitations and leg swelling.  Gastrointestinal: Negative for abdominal pain, diarrhea, constipation and abdominal distention.  Genitourinary: Negative for urgency, frequency, decreased urine volume and difficulty urinating.  Musculoskeletal: Negative for back pain, arthralgias and gait problem.  Skin: Negative for color change, pallor and rash.  Neurological:  Negative for dizziness, light-headedness, numbness and headaches.  Hematological: Negative for adenopathy. Does not bruise/bleed easily.  Psychiatric/Behavioral: Negative for suicidal ideas, confusion, sleep disturbance, self-injury, dysphoric mood, decreased concentration and agitation.       Objective:    BP 134/80  Pulse 77  Temp 98.6 F (37 C) (Oral)  Ht 5' 5.5" (1.664 m)  Wt 277 lb 3.2 oz (125.737 kg)  BMI 45.43 kg/m2  SpO2 97% General appearance: alert, cooperative, appears stated age and no distress Head: Normocephalic, without obvious abnormality, atraumatic Eyes: conjunctivae/corneas clear. PERRL, EOM's intact. Fundi benign. Ears: normal TM's and external ear canals both ears Nose: Nares normal. Septum midline. Mucosa normal. No drainage or sinus tenderness. Throat: lips, mucosa, and tongue normal; teeth and gums normal Neck: no adenopathy, no carotid bruit, no JVD, supple, symmetrical, trachea midline and thyroid not enlarged, symmetric, no tenderness/mass/nodules Back: symmetric, no curvature. ROM normal. No CVA tenderness. Lungs: clear to auscultation bilaterally Breasts: normal appearance, no masses or tenderness Heart: regular rate and rhythm, S1, S2 normal, no murmur, click, rub or gallop Abdomen: soft, non-tender; bowel sounds normal; no masses,  no organomegaly Pelvic: deferred gyn Extremities: extremities normal, atraumatic, no cyanosis or edema Pulses: 2+ and symmetric Skin: Skin color, texture, turgor normal. No rashes or lesions Lymph nodes: Cervical, supraclavicular, and axillary nodes normal. Neurologic: Alert and oriented X 3, normal strength and tone. Normal symmetric reflexes. Normal coordination and gait psych-- no depression, no anxiety    Assessment:    Healthy female exam.  Plan:  ghm utd Check labs   See After Visit Summary for Counseling Recommendations

## 2012-01-16 NOTE — Patient Instructions (Addendum)
Preventive Care for Adults, Female A healthy lifestyle and preventive care can promote health and wellness. Preventive health guidelines for women include the following key practices.  A routine yearly physical is a good way to check with your caregiver about your health and preventive screening. It is a chance to share any concerns and updates on your health, and to receive a thorough exam.   Visit your dentist for a routine exam and preventive care every 6 months. Brush your teeth twice a day and floss once a day. Good oral hygiene prevents tooth decay and gum disease.   The frequency of eye exams is based on your age, health, family medical history, use of contact lenses, and other factors. Follow your caregiver's recommendations for frequency of eye exams.   Eat a healthy diet. Foods like vegetables, fruits, whole grains, low-fat dairy products, and lean protein foods contain the nutrients you need without too many calories. Decrease your intake of foods high in solid fats, added sugars, and salt. Eat the right amount of calories for you.Get information about a proper diet from your caregiver, if necessary.   Regular physical exercise is one of the most important things you can do for your health. Most adults should get at least 150 minutes of moderate-intensity exercise (any activity that increases your heart rate and causes you to sweat) each week. In addition, most adults need muscle-strengthening exercises on 2 or more days a week.   Maintain a healthy weight. The body mass index (BMI) is a screening tool to identify possible weight problems. It provides an estimate of body fat based on height and weight. Your caregiver can help determine your BMI, and can help you achieve or maintain a healthy weight.For adults 20 years and older:   A BMI below 18.5 is considered underweight.   A BMI of 18.5 to 24.9 is normal.   A BMI of 25 to 29.9 is considered overweight.   A BMI of 30 and above is  considered obese.   Maintain normal blood lipids and cholesterol levels by exercising and minimizing your intake of saturated fat. Eat a balanced diet with plenty of fruit and vegetables. Blood tests for lipids and cholesterol should begin at age 20 and be repeated every 5 years. If your lipid or cholesterol levels are high, you are over 50, or you are at high risk for heart disease, you may need your cholesterol levels checked more frequently.Ongoing high lipid and cholesterol levels should be treated with medicines if diet and exercise are not effective.   If you smoke, find out from your caregiver how to quit. If you do not use tobacco, do not start.   If you are pregnant, do not drink alcohol. If you are breastfeeding, be very cautious about drinking alcohol. If you are not pregnant and choose to drink alcohol, do not exceed 1 drink per day. One drink is considered to be 12 ounces (355 mL) of beer, 5 ounces (148 mL) of wine, or 1.5 ounces (44 mL) of liquor.   Avoid use of street drugs. Do not share needles with anyone. Ask for help if you need support or instructions about stopping the use of drugs.   High blood pressure causes heart disease and increases the risk of stroke. Your blood pressure should be checked at least every 1 to 2 years. Ongoing high blood pressure should be treated with medicines if weight loss and exercise are not effective.   If you are 55 to 46   years old, ask your caregiver if you should take aspirin to prevent strokes.   Diabetes screening involves taking a blood sample to check your fasting blood sugar level. This should be done once every 3 years, after age 45, if you are within normal weight and without risk factors for diabetes. Testing should be considered at a younger age or be carried out more frequently if you are overweight and have at least 1 risk factor for diabetes.   Breast cancer screening is essential preventive care for women. You should practice "breast  self-awareness." This means understanding the normal appearance and feel of your breasts and may include breast self-examination. Any changes detected, no matter how small, should be reported to a caregiver. Women in their 20s and 30s should have a clinical breast exam (CBE) by a caregiver as part of a regular health exam every 1 to 3 years. After age 40, women should have a CBE every year. Starting at age 40, women should consider having a mammography (breast X-ray test) every year. Women who have a family history of breast cancer should talk to their caregiver about genetic screening. Women at a high risk of breast cancer should talk to their caregivers about having magnetic resonance imaging (MRI) and a mammography every year.   The Pap test is a screening test for cervical cancer. A Pap test can show cell changes on the cervix that might become cervical cancer if left untreated. A Pap test is a procedure in which cells are obtained and examined from the lower end of the uterus (cervix).   Women should have a Pap test starting at age 21.   Between ages 21 and 29, Pap tests should be repeated every 2 years.   Beginning at age 30, you should have a Pap test every 3 years as long as the past 3 Pap tests have been normal.   Some women have medical problems that increase the chance of getting cervical cancer. Talk to your caregiver about these problems. It is especially important to talk to your caregiver if a new problem develops soon after your last Pap test. In these cases, your caregiver may recommend more frequent screening and Pap tests.   The above recommendations are the same for women who have or have not gotten the vaccine for human papillomavirus (HPV).   If you had a hysterectomy for a problem that was not cancer or a condition that could lead to cancer, then you no longer need Pap tests. Even if you no longer need a Pap test, a regular exam is a good idea to make sure no other problems are  starting.   If you are between ages 65 and 70, and you have had normal Pap tests going back 10 years, you no longer need Pap tests. Even if you no longer need a Pap test, a regular exam is a good idea to make sure no other problems are starting.   If you have had past treatment for cervical cancer or a condition that could lead to cancer, you need Pap tests and screening for cancer for at least 20 years after your treatment.   If Pap tests have been discontinued, risk factors (such as a new sexual partner) need to be reassessed to determine if screening should be resumed.   The HPV test is an additional test that may be used for cervical cancer screening. The HPV test looks for the virus that can cause the cell changes on the cervix.   The cells collected during the Pap test can be tested for HPV. The HPV test could be used to screen women aged 30 years and older, and should be used in women of any age who have unclear Pap test results. After the age of 30, women should have HPV testing at the same frequency as a Pap test.   Colorectal cancer can be detected and often prevented. Most routine colorectal cancer screening begins at the age of 50 and continues through age 75. However, your caregiver may recommend screening at an earlier age if you have risk factors for colon cancer. On a yearly basis, your caregiver may provide home test kits to check for hidden blood in the stool. Use of a small camera at the end of a tube, to directly examine the colon (sigmoidoscopy or colonoscopy), can detect the earliest forms of colorectal cancer. Talk to your caregiver about this at age 50, when routine screening begins. Direct examination of the colon should be repeated every 5 to 10 years through age 75, unless early forms of pre-cancerous polyps or small growths are found.   Hepatitis C blood testing is recommended for all people born from 1945 through 1965 and any individual with known risks for hepatitis C.    Practice safe sex. Use condoms and avoid high-risk sexual practices to reduce the spread of sexually transmitted infections (STIs). STIs include gonorrhea, chlamydia, syphilis, trichomonas, herpes, HPV, and human immunodeficiency virus (HIV). Herpes, HIV, and HPV are viral illnesses that have no cure. They can result in disability, cancer, and death. Sexually active women aged 25 and younger should be checked for chlamydia. Older women with new or multiple partners should also be tested for chlamydia. Testing for other STIs is recommended if you are sexually active and at increased risk.   Osteoporosis is a disease in which the bones lose minerals and strength with aging. This can result in serious bone fractures. The risk of osteoporosis can be identified using a bone density scan. Women ages 65 and over and women at risk for fractures or osteoporosis should discuss screening with their caregivers. Ask your caregiver whether you should take a calcium supplement or vitamin D to reduce the rate of osteoporosis.   Menopause can be associated with physical symptoms and risks. Hormone replacement therapy is available to decrease symptoms and risks. You should talk to your caregiver about whether hormone replacement therapy is right for you.   Use sunscreen with sun protection factor (SPF) of 30 or more. Apply sunscreen liberally and repeatedly throughout the day. You should seek shade when your shadow is shorter than you. Protect yourself by wearing long sleeves, pants, a wide-brimmed hat, and sunglasses year round, whenever you are outdoors.   Once a month, do a whole body skin exam, using a mirror to look at the skin on your back. Notify your caregiver of new moles, moles that have irregular borders, moles that are larger than a pencil eraser, or moles that have changed in shape or color.   Stay current with required immunizations.   Influenza. You need a dose every fall (or winter). The composition of  the flu vaccine changes each year, so being vaccinated once is not enough.   Pneumococcal polysaccharide. You need 1 to 2 doses if you smoke cigarettes or if you have certain chronic medical conditions. You need 1 dose at age 65 (or older) if you have never been vaccinated.   Tetanus, diphtheria, pertussis (Tdap, Td). Get 1 dose of   Tdap vaccine if you are younger than age 65, are over 65 and have contact with an infant, are a healthcare worker, are pregnant, or simply want to be protected from whooping cough. After that, you need a Td booster dose every 10 years. Consult your caregiver if you have not had at least 3 tetanus and diphtheria-containing shots sometime in your life or have a deep or dirty wound.   HPV. You need this vaccine if you are a woman age 26 or younger. The vaccine is given in 3 doses over 6 months.   Measles, mumps, rubella (MMR). You need at least 1 dose of MMR if you were born in 1957 or later. You may also need a second dose.   Meningococcal. If you are age 19 to 21 and a first-year college student living in a residence hall, or have one of several medical conditions, you need to get vaccinated against meningococcal disease. You may also need additional booster doses.   Zoster (shingles). If you are age 60 or older, you should get this vaccine.   Varicella (chickenpox). If you have never had chickenpox or you were vaccinated but received only 1 dose, talk to your caregiver to find out if you need this vaccine.   Hepatitis A. You need this vaccine if you have a specific risk factor for hepatitis A virus infection or you simply wish to be protected from this disease. The vaccine is usually given as 2 doses, 6 to 18 months apart.   Hepatitis B. You need this vaccine if you have a specific risk factor for hepatitis B virus infection or you simply wish to be protected from this disease. The vaccine is given in 3 doses, usually over 6 months.  Preventive Services /  Frequency Ages 19 to 39  Blood pressure check.** / Every 1 to 2 years.   Lipid and cholesterol check.** / Every 5 years beginning at age 20.   Clinical breast exam.** / Every 3 years for women in their 20s and 30s.   Pap test.** / Every 2 years from ages 21 through 29. Every 3 years starting at age 30 through age 65 or 70 with a history of 3 consecutive normal Pap tests.   HPV screening.** / Every 3 years from ages 30 through ages 65 to 70 with a history of 3 consecutive normal Pap tests.   Hepatitis C blood test.** / For any individual with known risks for hepatitis C.   Skin self-exam. / Monthly.   Influenza immunization.** / Every year.   Pneumococcal polysaccharide immunization.** / 1 to 2 doses if you smoke cigarettes or if you have certain chronic medical conditions.   Tetanus, diphtheria, pertussis (Tdap, Td) immunization. / A one-time dose of Tdap vaccine. After that, you need a Td booster dose every 10 years.   HPV immunization. / 3 doses over 6 months, if you are 26 and younger.   Measles, mumps, rubella (MMR) immunization. / You need at least 1 dose of MMR if you were born in 1957 or later. You may also need a second dose.   Meningococcal immunization. / 1 dose if you are age 19 to 21 and a first-year college student living in a residence hall, or have one of several medical conditions, you need to get vaccinated against meningococcal disease. You may also need additional booster doses.   Varicella immunization.** / Consult your caregiver.   Hepatitis A immunization.** / Consult your caregiver. 2 doses, 6 to 18 months   apart.   Hepatitis B immunization.** / Consult your caregiver. 3 doses usually over 6 months.  Ages 40 to 64  Blood pressure check.** / Every 1 to 2 years.   Lipid and cholesterol check.** / Every 5 years beginning at age 20.   Clinical breast exam.** / Every year after age 40.   Mammogram.** / Every year beginning at age 40 and continuing for as  long as you are in good health. Consult with your caregiver.   Pap test.** / Every 3 years starting at age 30 through age 65 or 70 with a history of 3 consecutive normal Pap tests.   HPV screening.** / Every 3 years from ages 30 through ages 65 to 70 with a history of 3 consecutive normal Pap tests.   Fecal occult blood test (FOBT) of stool. / Every year beginning at age 50 and continuing until age 75. You may not need to do this test if you get a colonoscopy every 10 years.   Flexible sigmoidoscopy or colonoscopy.** / Every 5 years for a flexible sigmoidoscopy or every 10 years for a colonoscopy beginning at age 50 and continuing until age 75.   Hepatitis C blood test.** / For all people born from 1945 through 1965 and any individual with known risks for hepatitis C.   Skin self-exam. / Monthly.   Influenza immunization.** / Every year.   Pneumococcal polysaccharide immunization.** / 1 to 2 doses if you smoke cigarettes or if you have certain chronic medical conditions.   Tetanus, diphtheria, pertussis (Tdap, Td) immunization.** / A one-time dose of Tdap vaccine. After that, you need a Td booster dose every 10 years.   Measles, mumps, rubella (MMR) immunization. / You need at least 1 dose of MMR if you were born in 1957 or later. You may also need a second dose.   Varicella immunization.** / Consult your caregiver.   Meningococcal immunization.** / Consult your caregiver.   Hepatitis A immunization.** / Consult your caregiver. 2 doses, 6 to 18 months apart.   Hepatitis B immunization.** / Consult your caregiver. 3 doses, usually over 6 months.  Ages 65 and over  Blood pressure check.** / Every 1 to 2 years.   Lipid and cholesterol check.** / Every 5 years beginning at age 20.   Clinical breast exam.** / Every year after age 40.   Mammogram.** / Every year beginning at age 40 and continuing for as long as you are in good health. Consult with your caregiver.   Pap test.** /  Every 3 years starting at age 30 through age 65 or 70 with a 3 consecutive normal Pap tests. Testing can be stopped between 65 and 70 with 3 consecutive normal Pap tests and no abnormal Pap or HPV tests in the past 10 years.   HPV screening.** / Every 3 years from ages 30 through ages 65 or 70 with a history of 3 consecutive normal Pap tests. Testing can be stopped between 65 and 70 with 3 consecutive normal Pap tests and no abnormal Pap or HPV tests in the past 10 years.   Fecal occult blood test (FOBT) of stool. / Every year beginning at age 50 and continuing until age 75. You may not need to do this test if you get a colonoscopy every 10 years.   Flexible sigmoidoscopy or colonoscopy.** / Every 5 years for a flexible sigmoidoscopy or every 10 years for a colonoscopy beginning at age 50 and continuing until age 75.   Hepatitis   C blood test.** / For all people born from 1945 through 1965 and any individual with known risks for hepatitis C.   Osteoporosis screening.** / A one-time screening for women ages 65 and over and women at risk for fractures or osteoporosis.   Skin self-exam. / Monthly.   Influenza immunization.** / Every year.   Pneumococcal polysaccharide immunization.** / 1 dose at age 65 (or older) if you have never been vaccinated.   Tetanus, diphtheria, pertussis (Tdap, Td) immunization. / A one-time dose of Tdap vaccine if you are over 65 and have contact with an infant, are a healthcare worker, or simply want to be protected from whooping cough. After that, you need a Td booster dose every 10 years.   Varicella immunization.** / Consult your caregiver.   Meningococcal immunization.** / Consult your caregiver.   Hepatitis A immunization.** / Consult your caregiver. 2 doses, 6 to 18 months apart.   Hepatitis B immunization.** / Check with your caregiver. 3 doses, usually over 6 months.  ** Family history and personal history of risk and conditions may change your caregiver's  recommendations. Document Released: 06/03/2001 Document Revised: 03/27/2011 Document Reviewed: 09/02/2010 [employer] Patient Information 2012 [employer]. 

## 2012-01-16 NOTE — Assessment & Plan Note (Signed)
con't meds 

## 2012-01-20 ENCOUNTER — Telehealth: Payer: Self-pay

## 2012-01-20 NOTE — Telephone Encounter (Signed)
Pt called to see why we called. Advised pt lab results was mailed and urine was negative no infection. Pt stated understanding.      MW

## 2012-03-05 ENCOUNTER — Ambulatory Visit (INDEPENDENT_AMBULATORY_CARE_PROVIDER_SITE_OTHER)
Admission: RE | Admit: 2012-03-05 | Discharge: 2012-03-05 | Disposition: A | Discharge status: Home / Self Care | Payer: Managed Care, Other (non HMO) | Source: Ambulatory Visit | Attending: Family Medicine | Admitting: Family Medicine

## 2012-03-05 DIAGNOSIS — E785 Hyperlipidemia, unspecified: Secondary | ICD-10-CM

## 2012-03-31 ENCOUNTER — Telehealth: Payer: Self-pay | Admitting: Family Medicine

## 2012-03-31 ENCOUNTER — Ambulatory Visit: Payer: Managed Care, Other (non HMO)

## 2012-03-31 ENCOUNTER — Ambulatory Visit: Payer: Managed Care, Other (non HMO) | Admitting: Family Medicine

## 2012-03-31 VITALS — BP 138/82 | HR 90 | Temp 98.4°F | Resp 16 | Ht 66.0 in | Wt 278.0 lb

## 2012-03-31 DIAGNOSIS — R079 Chest pain, unspecified: Secondary | ICD-10-CM

## 2012-03-31 DIAGNOSIS — R202 Paresthesia of skin: Secondary | ICD-10-CM

## 2012-03-31 DIAGNOSIS — J209 Acute bronchitis, unspecified: Secondary | ICD-10-CM

## 2012-03-31 DIAGNOSIS — F458 Other somatoform disorders: Secondary | ICD-10-CM

## 2012-03-31 DIAGNOSIS — R209 Unspecified disturbances of skin sensation: Secondary | ICD-10-CM

## 2012-03-31 LAB — POCT CBC
Granulocyte percent: 46.9 %G (ref 37–80)
HCT, POC: 46.1 % (ref 37.7–47.9)
Hemoglobin: 13.9 g/dL (ref 12.2–16.2)
Lymph, poc: 3.5 — AB (ref 0.6–3.4)
MCH, POC: 30.2 pg (ref 27–31.2)
MCHC: 30.2 g/dL — AB (ref 31.8–35.4)
MCV: 100.1 fL — AB (ref 80–97)
MID (cbc): 0.6 (ref 0–0.9)
MPV: 7.9 fL (ref 0–99.8)
POC Granulocyte: 3.7 (ref 2–6.9)
POC LYMPH PERCENT: 45.3 %L (ref 10–50)
POC MID %: 7.8 % (ref 0–12)
Platelet Count, POC: 288 10*3/uL (ref 142–424)
RBC: 4.61 M/uL (ref 4.04–5.48)
RDW, POC: 14 %
WBC: 7.8 10*3/uL (ref 4.6–10.2)

## 2012-03-31 LAB — POCT GLYCOSYLATED HEMOGLOBIN (HGB A1C): Hemoglobin A1C: 5.2

## 2012-03-31 MED ORDER — AZITHROMYCIN 250 MG PO TABS: ORAL_TABLET | ORAL | Status: DC

## 2012-03-31 MED ORDER — BACLOFEN 10 MG PO TABS: 10.0000 mg | ORAL_TABLET | Freq: Every evening | ORAL | Status: DC | PRN

## 2012-03-31 NOTE — Progress Notes (Signed)
[facility]:  Office Visit  Chief Complaint:  Chief Complaint  Patient presents with  . chest pressure  . Headache    all symptoms for about a month  . Dizziness  . Tingling    fingers and feet    HPI: Tiffany Gallagher is a 46 y.o. female who complains of a chronic history of right sided chest pain, numbness and tingling in legs x  > 1 month. Has had cardiac workup which was normal per patient, patient had  Echocardiogram in 09/2010  which showed nl EF and structure. She aslo has a cardiac CT in 02/2012 which was normal. She also has seen GI and had endoscopy. Had polyp in esophagus, has a lot of gas in stomach. The stomach sxs have on for last 3 months. HAs been getting heavier. This morning felt really bad so that is why she is here. She has had recent URI. Head cold, sinuss, taking mucinex.  Risk factors for CAD: Dyslipidemia, smoker+ tobacco Risk factors for PE: no recent surgeries in last 3 months, no cancer/malgnancy, walks around up aisles regular,  no prior blood clots, no estrogen. Taking antacids with minimal releif. Family h/o diabetes.   Past Medical History  Diagnosis Date  . GERD (gastroesophageal reflux disease)   . Hyperlipidemia   . Migraines   . Hypertension   . Smoker     HALF PPD  . Fibroid   . Anemia    Past Surgical History  Procedure Date  . Cesarean section 1995  . Vaginal hysterectomy 1998    partial   History   Social History  . Marital Status: Single    Spouse Name: N/A    Number of Children: 1  . Years of Education: 17   Occupational History  . customer service     Social History Main Topics  . Smoking status: Current Every Day Smoker -- 0.8 packs/day for 20 years    Types: Cigarettes  . Smokeless tobacco: Never Used     Comment: cutting back  . Alcohol Use: 0.0 oz/week     Comment: less than 1 glass a week  . Drug Use: No  . Sexually Active: Yes -- Female partner(s)    Birth Control/ Protection: Surgical, Condom    Other Topics Concern  . None   Social History Narrative   Exercise-- walking, abs dvd   Family History  Problem Relation Age of Onset  . Coronary artery disease Mother   . Hypertension Mother   . Kidney disease Mother 79    dialysis  . Aneurysm Mother     brain  . Stroke Mother   . Heart disease Mother     MI  . Cancer Father 76    brain cancer  . Diabetes Sister   . Arthritis Brother   . Hypertension Brother   . Diabetes Brother   . Hypertension Brother    Allergies  Allergen Reactions  . Lipitor (Atorvastatin Calcium)    Prior to Admission medications   Medication Sig Start Date End Date Taking? Authorizing Provider  acetaminophen (TYLENOL) 500 MG tablet Take 500 mg by mouth daily.     Yes Historical Provider, MD  Aloe Vera (ALOE CAPE) MISC as needed.     Yes Historical Provider, MD  benazepril (LOTENSIN) 10 MG tablet Take 1 tablet (10 mg total) by mouth daily. 03/19/11  Yes Lelon Perla, DO  cholecalciferol (VITAMIN D) 1000 UNITS tablet Take 1,000 Units by mouth daily.  Yes Historical Provider, MD  Coenzyme Q10 (COQ10 PO) Take 1 tablet by mouth daily.     Yes Historical Provider, MD  cyanocobalamin 500 MCG tablet Take 500 mcg by mouth daily.     Yes Historical Provider, MD  esomeprazole (NEXIUM) 40 MG capsule Take 40 mg by mouth daily before breakfast.   Yes Historical Provider, MD  hydrochlorothiazide (HYDRODIURIL) 25 MG tablet 1 po qd 01/16/12  Yes Yvonne R Lowne, DO  omeprazole (PRILOSEC) 20 MG capsule Take 1 capsule (20 mg total) by mouth daily. 10/31/11 02/28/13 Yes Yvonne R Lowne, DO  simethicone (MYLICON) 80 MG chewable tablet Chew 80 mg by mouth 3 (three) times daily as needed.     Yes Historical Provider, MD  benazepril (LOTENSIN) 10 MG tablet Take 1 tablet (10 mg total) by mouth daily. 10/31/11   Lelon Perla, DO     ROS: The patient denies fevers, chills, night sweats, unintentional weight loss, palpitations, wheezing, dyspnea on exertion, nausea,  vomiting, abdominal pain, dysuria, hematuria, melena,  weakness.   All other systems have been reviewed and were otherwise negative with the exception of those mentioned in the HPI and as above.    PHYSICAL EXAM: Filed Vitals:   03/31/12 1818  BP: 138/82  Pulse: 90  Temp: 98.4 F (36.9 C)  Resp: 16   Filed Vitals:   03/31/12 1818  Height: 5\' 6"  (1.676 m)  Weight: 278 lb (126.1 kg)   Body mass index is 44.87 kg/(m^2).  General: Alert, no acute distress, obese AA female HEENT:  Normocephalic, atraumatic, oropharynx patent. EOMI, PERRLA, fundoscopic exam nl Cardiovascular:  Regular rate and rhythm, no rubs murmurs or gallops.  No Carotid bruits, radial pulse intact. No pedal edema. + chest wall tenderness on palpation on right side Respiratory: Clear to auscultation bilaterally.  No wheezes, rales, or rhonchi.  No cyanosis, no use of accessory musculature GI: No organomegaly, abdomen is soft and non-tender, positive bowel sounds.  No masses. Skin: No rashes. Neurologic: Facial musculature symmetric. 5/5 UE and Delphin Funes Psychiatric: Patient is appropriate throughout our interaction. Lymphatic: No cervical lymphadenopathy Musculoskeletal: Gait intact.   LABS: Results for orders placed in visit on 03/31/12  POCT CBC      Component Value Range   WBC 7.8  4.6 - 10.2 K/uL   Lymph, poc 3.5 (*) 0.6 - 3.4   POC LYMPH PERCENT 45.3  10 - 50 %L   MID (cbc) 0.6  0 - 0.9   POC MID % 7.8  0 - 12 %M   POC Granulocyte 3.7  2 - 6.9   Granulocyte percent 46.9  37 - 80 %G   RBC 4.61  4.04 - 5.48 M/uL   Hemoglobin 13.9  12.2 - 16.2 g/dL   HCT, POC 16.1  09.6 - 47.9 %   MCV 100.1 (*) 80 - 97 fL   MCH, POC 30.2  27 - 31.2 pg   MCHC 30.2 (*) 31.8 - 35.4 g/dL   RDW, POC 04.5     Platelet Count, POC 288  142 - 424 K/uL   MPV 7.9  0 - 99.8 fL  POCT GLYCOSYLATED HEMOGLOBIN (HGB A1C)      Component Value Range   Hemoglobin A1C 5.2       EKG/XRAY:   Primary read interpreted by Dr. Conley Rolls at [facility]. ?  Bilateral increase vascular markings vs hilar LAD vs ? Unlikely opacity on left upper lobe EKG shows NSR at 78 bpm, no ST elevation, depression  ASSESSMENT/PLAN: Encounter Diagnoses  Name Primary?  . Chest pain Yes  . Paresthesia   . Acute bronchitis   . Aerophagia    D/w patient possible causes of chest pain Bronchitis vs chest wall pain due to coughing vs costochondritis vs  less likely PNA We are putting her on Azithromycin for acute bronchitis. Patient is a smoker. Advise her to stop smoking.  She has severe bloating and gas, likes to belch, does it involuntarily;  has taken GasX and Beano and PPIs without relief. She belches a lot and has GERD, will try a trial of low dose baclofen to reduce spasms of esophageal sphincter and see if that helpes with belching and GERD sxs I advise her to f/u with her GI doctor and her PCP for this. If she has nay SEs from the baclofen then discontinue. We are only trying a small dose trial only at night since it can cause drowsiness.  Rx Azithromycin Rx Baclofen Go to ER prn  Harol Shabazz PHUONG, DO 03/31/2012 7:36 PM

## 2012-03-31 NOTE — Telephone Encounter (Signed)
RN Overrode Recommendation:  Make Appointment  RN judgement, no appt. Available until 445p. Patient agreed to go to an UC now to be seen

## 2012-03-31 NOTE — Telephone Encounter (Signed)
Patient Information:  Caller Name: Murphy  Phone: [phone]  Patient: Tiffany Gallagher, Tiffany Gallagher  Gender: Female  DOB: 30-Dec-1965  Age: 46 Years  PCP: Lelon Perla.  Pregnant: No  Office Follow Up:  Does the office need to follow up with this patient?: No  Instructions For The Office: N/A   Symptoms  Reason For Call & Symptoms: Having "sx all over the place for the past week".  Feels like gas bubbles and then pressure in her chest that radiates into her neck and head with some dizziness; tingling in her fingers at times; back of right leg feels wet but is dry.  Reviewed Health History In EMR: Yes  Reviewed Medications In EMR: Yes  Reviewed Allergies In EMR: Yes  Reviewed Surgeries / Procedures: Yes  Date of Onset of Symptoms: 03/24/2012  Treatments Tried: Gas X, regular meds for reflux, TUMS  Treatments Tried Worked: Yes OB:  LMP: Unknown  Guideline(s) Used:  Chest Pain  Disposition Per Guideline:   Go to ED Now (or to Office with PCP Approval)  Reason For Disposition Reached:   Intermittent chest pain and pain has been increasing in severity or frequency  Advice Given:  N/A  RN Overrode Recommendation:  Make Appointment  RN judgement, no appt. Available until 445p.  Patient agreed to go to an UC now to be seen.

## 2012-04-01 ENCOUNTER — Other Ambulatory Visit: Payer: Self-pay | Admitting: Family Medicine

## 2012-04-01 LAB — COMPREHENSIVE METABOLIC PANEL WITH GFR
Albumin: 4.1 g/dL (ref 3.5–5.2)
BUN: 11 mg/dL (ref 6–23)
CO2: 25 meq/L (ref 19–32)
Calcium: 9.3 mg/dL (ref 8.4–10.5)
Chloride: 104 meq/L (ref 96–112)
Creat: 0.76 mg/dL (ref 0.50–1.10)
Glucose, Bld: 137 mg/dL — ABNORMAL HIGH (ref 70–99)
Potassium: 3.7 meq/L (ref 3.5–5.3)

## 2012-04-01 LAB — COMPREHENSIVE METABOLIC PANEL
ALT: 18 U/L (ref 0–35)
AST: 17 U/L (ref 0–37)
Alkaline Phosphatase: 66 U/L (ref 39–117)
Sodium: 138 mEq/L (ref 135–145)
Total Bilirubin: 0.3 mg/dL (ref 0.3–1.2)
Total Protein: 7.8 g/dL (ref 6.0–8.3)

## 2012-04-01 MED ORDER — BENAZEPRIL HCL 10 MG PO TABS: 10.0000 mg | ORAL_TABLET | Freq: Every day | ORAL | Status: DC

## 2012-04-01 MED ORDER — OMEPRAZOLE 20 MG PO CPDR: 20.0000 mg | DELAYED_RELEASE_CAPSULE | Freq: Every day | ORAL | Status: DC

## 2012-04-01 NOTE — Telephone Encounter (Signed)
PRILOSEC cap DR 20 mg   No other info provided

## 2012-04-01 NOTE — Telephone Encounter (Signed)
LOTENSIN tablet 10 mg *PLAIN*-B->  Only info given

## 2012-04-01 NOTE — Telephone Encounter (Signed)
Rx faxed.    KP 

## 2012-04-02 ENCOUNTER — Telehealth: Payer: Self-pay | Admitting: Family Medicine

## 2012-04-02 NOTE — Telephone Encounter (Signed)
LM re lab results

## 2012-04-16 MED ORDER — BENAZEPRIL HCL 10 MG PO TABS: 10.0000 mg | ORAL_TABLET | Freq: Every day | ORAL | Status: DC

## 2012-04-16 MED ORDER — OMEPRAZOLE 20 MG PO CPDR: 20.0000 mg | DELAYED_RELEASE_CAPSULE | Freq: Every day | ORAL | Status: DC

## 2012-04-16 NOTE — Telephone Encounter (Signed)
Pt called back Rx were sent to wrong pharmacy. Rx resent to correct pharmacy Pt aware.

## 2012-04-16 NOTE — Addendum Note (Signed)
Addended by: Candie Echevaria L on: 04/16/2012 09:25 AM   Modules accepted: Orders

## 2012-07-15 ENCOUNTER — Ambulatory Visit: Payer: Managed Care, Other (non HMO) | Admitting: Family Medicine

## 2012-08-19 ENCOUNTER — Other Ambulatory Visit: Payer: Self-pay

## 2012-08-19 DIAGNOSIS — Z1231 Encounter for screening mammogram for malignant neoplasm of breast: Secondary | ICD-10-CM

## 2012-08-22 ENCOUNTER — Emergency Department (HOSPITAL_COMMUNITY)
Admission: EM | Admit: 2012-08-22 | Discharge: 2012-08-22 | Disposition: A | Discharge status: Home / Self Care | Payer: Managed Care, Other (non HMO) | Attending: Emergency Medicine | Admitting: Emergency Medicine

## 2012-08-22 ENCOUNTER — Emergency Department (HOSPITAL_COMMUNITY): Payer: Managed Care, Other (non HMO)

## 2012-08-22 ENCOUNTER — Encounter (HOSPITAL_COMMUNITY): Payer: Self-pay | Admitting: *Deleted

## 2012-08-22 DIAGNOSIS — I10 Essential (primary) hypertension: Secondary | ICD-10-CM | POA: Insufficient documentation

## 2012-08-22 DIAGNOSIS — K219 Gastro-esophageal reflux disease without esophagitis: Secondary | ICD-10-CM | POA: Insufficient documentation

## 2012-08-22 DIAGNOSIS — Z8742 Personal history of other diseases of the female genital tract: Secondary | ICD-10-CM | POA: Insufficient documentation

## 2012-08-22 DIAGNOSIS — F17200 Nicotine dependence, unspecified, uncomplicated: Secondary | ICD-10-CM | POA: Insufficient documentation

## 2012-08-22 DIAGNOSIS — Z8639 Personal history of other endocrine, nutritional and metabolic disease: Secondary | ICD-10-CM | POA: Insufficient documentation

## 2012-08-22 DIAGNOSIS — M542 Cervicalgia: Secondary | ICD-10-CM | POA: Insufficient documentation

## 2012-08-22 DIAGNOSIS — D649 Anemia, unspecified: Secondary | ICD-10-CM | POA: Insufficient documentation

## 2012-08-22 DIAGNOSIS — G43909 Migraine, unspecified, not intractable, without status migrainosus: Secondary | ICD-10-CM | POA: Insufficient documentation

## 2012-08-22 DIAGNOSIS — Z79899 Other long term (current) drug therapy: Secondary | ICD-10-CM | POA: Insufficient documentation

## 2012-08-22 DIAGNOSIS — R079 Chest pain, unspecified: Secondary | ICD-10-CM | POA: Insufficient documentation

## 2012-08-22 DIAGNOSIS — H9209 Otalgia, unspecified ear: Secondary | ICD-10-CM | POA: Insufficient documentation

## 2012-08-22 DIAGNOSIS — Z862 Personal history of diseases of the blood and blood-forming organs and certain disorders involving the immune mechanism: Secondary | ICD-10-CM | POA: Insufficient documentation

## 2012-08-22 DIAGNOSIS — R55 Syncope and collapse: Secondary | ICD-10-CM | POA: Insufficient documentation

## 2012-08-22 LAB — TROPONIN I: Troponin I: <0.3 ng/mL (ref <0.30)

## 2012-08-22 LAB — BASIC METABOLIC PANEL
CO2: 27 mEq/L (ref 19–32)
Chloride: 101 mEq/L (ref 96–112)
GFR calc Af Amer: >90 mL/min (ref >90)
Potassium: 3.4 mEq/L — ABNORMAL LOW (ref 3.5–5.1)

## 2012-08-22 LAB — CBC WITH DIFFERENTIAL/PLATELET
Eosinophils Relative: 3 % (ref 0–5)
HCT: 37.5 % (ref 36.0–46.0)
Hemoglobin: 13.1 g/dL (ref 12.0–15.0)
Lymphocytes Relative: 46 % (ref 12–46)
Lymphs Abs: 3.2 10*3/uL (ref 0.7–4.0)
MCV: 92.1 fL (ref 78.0–100.0)
Monocytes Absolute: 0.5 10*3/uL (ref 0.1–1.0)
Monocytes Relative: 7 % (ref 3–12)
Neutro Abs: 3.1 10*3/uL (ref 1.7–7.7)
WBC: 7 10*3/uL (ref 4.0–10.5)

## 2012-08-22 NOTE — ED Provider Notes (Signed)
Medical screening examination/treatment/procedure(s) were performed by non-physician practitioner and as supervising physician I was immediately available for consultation/collaboration.   Charles B. Bernette Mayers, MD 08/22/12 1527

## 2012-08-22 NOTE — ED Notes (Signed)
Pt reports while sing in church to day she had a n on set of LT ear Pain into Lt neck and lt cp. Pt aslo reports passing out and waKING UP ON THE FLOOR. Pt reports pain is a tight feeling.

## 2012-08-22 NOTE — ED Provider Notes (Signed)
History     CSN: [id number]  Arrival date & time 08/22/12  1244   First MD Initiated Contact with Patient 08/22/12 1246      Chief Complaint  Patient presents with  . Chest Pain    (Consider location/radiation/quality/duration/timing/severity/associated sxs/prior treatment) Patient is a 47 y.o. female presenting with chest pain. The history is provided by the patient.  Chest Pain Associated symptoms: no abdominal pain, no dysphagia, no fever, no nausea, no shortness of breath and not vomiting   Associated symptoms comment:  Sudden onset pain in the left ear and left side of her neck that radiated into left chest. She was at church at the time, and reports that she had near syncope afterward that was brief in duration. Pain continues now and also involves the right chest where she has chronic pain. No nausea, SOB, diaphoresis.    Past Medical History  Diagnosis Date  . GERD (gastroesophageal reflux disease)   . Hyperlipidemia   . Migraines   . Hypertension   . Smoker     HALF PPD  . Fibroid   . Anemia     Past Surgical History  Procedure Laterality Date  . Cesarean section  1995  . Vaginal hysterectomy  1998    partial    Family History  Problem Relation Age of Onset  . Coronary artery disease Mother   . Hypertension Mother   . Kidney disease Mother 45    dialysis  . Aneurysm Mother     brain  . Stroke Mother   . Heart disease Mother     MI  . Cancer Father 60    brain cancer  . Diabetes Sister   . Arthritis Brother   . Hypertension Brother   . Diabetes Brother   . Hypertension Brother     History  Substance Use Topics  . Smoking status: Current Every Day Smoker -- 0.80 packs/day for 20 years    Types: Cigarettes  . Smokeless tobacco: Never Used     Comment: cutting back  . Alcohol Use: 0.0 oz/week     Comment: less than 1 glass a week    OB History   Grav Para Term Preterm Abortions TAB SAB Ect Mult Living   2 1 1  1     1       Review of  Systems  Constitutional: Negative for fever.  HENT: Positive for ear pain and neck pain. Negative for facial swelling and trouble swallowing.   Respiratory: Negative for shortness of breath.   Cardiovascular: Positive for chest pain.  Gastrointestinal: Negative for nausea, vomiting and abdominal pain.  Genitourinary: Negative for dysuria.  Neurological: Positive for syncope.  Psychiatric/Behavioral: Negative for confusion.    Allergies  Lipitor  Home Medications   Current Outpatient Rx  Name  Route  Sig  Dispense  Refill  . acetaminophen (TYLENOL) 500 MG tablet   Oral   Take 500 mg by mouth daily.           . Aloe Vera (ALOE CAPE) MISC      as needed.           Marland Kitchen azithromycin (ZITHROMAX) 250 MG tablet      Take 2 tabs PO now, then 1 tab po daily for next 4 days.   6 tablet   0   . baclofen (LIORESAL) 10 MG tablet   Oral   Take 1 tablet (10 mg total) by mouth at bedtime as needed.   30  each   0   . benazepril (LOTENSIN) 10 MG tablet   Oral   Take 1 tablet (10 mg total) by mouth daily.   90 tablet   1   . benazepril (LOTENSIN) 10 MG tablet   Oral   Take 1 tablet (10 mg total) by mouth daily.   90 tablet   1   . cholecalciferol (VITAMIN D) 1000 UNITS tablet   Oral   Take 1,000 Units by mouth daily.           . Coenzyme Q10 (COQ10 PO)   Oral   Take 1 tablet by mouth daily.           . cyanocobalamin 500 MCG tablet   Oral   Take 500 mcg by mouth daily.           Marland Kitchen esomeprazole (NEXIUM) 40 MG capsule   Oral   Take 40 mg by mouth daily before breakfast.         . hydrochlorothiazide (HYDRODIURIL) 25 MG tablet      1 po qd   90 tablet   3   . omeprazole (PRILOSEC) 20 MG capsule   Oral   Take 1 capsule (20 mg total) by mouth daily.   90 capsule   3   . omeprazole (PRILOSEC) 20 MG capsule   Oral   Take 1 capsule (20 mg total) by mouth daily.   90 capsule   3     Labs are due now   . simethicone (MYLICON) 80 MG chewable tablet    Oral   Chew 80 mg by mouth 3 (three) times daily as needed.             BP 111/64  Pulse 70  Temp(Src) 98 F (36.7 C) (Oral)  Resp 18  SpO2 99%  Physical Exam  Constitutional: She is oriented to person, place, and time. She appears well-developed and well-nourished.  HENT:  Head: Normocephalic.  Right Ear: External ear normal.  Left Ear: External ear normal.  Mouth/Throat: Oropharynx is clear and moist.  Eyes: Conjunctivae are normal.  Neck: Normal range of motion. Neck supple.  Cardiovascular: Normal rate and regular rhythm.   No murmur heard. Pulmonary/Chest: Effort normal and breath sounds normal. She has no wheezes. She has no rales. She exhibits no tenderness.  Abdominal: Soft. Bowel sounds are normal. There is no tenderness. There is no rebound and no guarding.  Musculoskeletal: Normal range of motion. She exhibits no edema.  Neurological: She is alert and oriented to person, place, and time.  Skin: Skin is warm and dry. No rash noted.  Psychiatric: She has a normal mood and affect.    ED Course  Procedures (including critical care time) Results for orders placed during the hospital encounter of 08/22/12  BASIC METABOLIC PANEL      Result Value Range   Sodium 137  135 - 145 mEq/L   Potassium 3.4 (*) 3.5 - 5.1 mEq/L   Chloride 101  96 - 112 mEq/L   CO2 27  19 - 32 mEq/L   Glucose, Bld 84  70 - 99 mg/dL   BUN 10  6 - 23 mg/dL   Creatinine, Ser 9.14  0.50 - 1.10 mg/dL   Calcium 9.4  8.4 - 78.2 mg/dL   GFR calc non Af Amer >90  >90 mL/min   GFR calc Af Amer >90  >90 mL/min  CBC WITH DIFFERENTIAL      Result Value Range  WBC 7.0  4.0 - 10.5 K/uL   RBC 4.07  3.87 - 5.11 MIL/uL   Hemoglobin 13.1  12.0 - 15.0 g/dL   HCT 10.9  60.4 - 54.0 %   MCV 92.1  78.0 - 100.0 fL   MCH 32.2  26.0 - 34.0 pg   MCHC 34.9  30.0 - 36.0 g/dL   RDW 98.1  19.1 - 47.8 %   Platelets 252  150 - 400 K/uL   Neutrophils Relative 44  43 - 77 %   Neutro Abs 3.1  1.7 - 7.7 K/uL    Lymphocytes Relative 46  12 - 46 %   Lymphs Abs 3.2  0.7 - 4.0 K/uL   Monocytes Relative 7  3 - 12 %   Monocytes Absolute 0.5  0.1 - 1.0 K/uL   Eosinophils Relative 3  0 - 5 %   Eosinophils Absolute 0.2  0.0 - 0.7 K/uL   Basophils Relative 0  0 - 1 %   Basophils Absolute 0.0  0.0 - 0.1 K/uL  TROPONIN I      Result Value Range   Troponin I <0.30  <0.30 ng/mL   Dg Neck Soft Tissue  08/22/2012  *RADIOLOGY REPORT*  Clinical Data: Chest pain  NECK SOFT TISSUES - 1+ VIEW  Comparison: None.  Findings: Normal prevertebral soft tissues.  Normal cervical spine alignment.  No significant cervical degenerative process or acute finding.  No unexpected radiopaque foreign body appreciated  IMPRESSION: No acute finding by plain radiography   Original Report Authenticated By: Judie Petit. Miles Costain, M.D.    Dg Chest 2 View  08/22/2012  *RADIOLOGY REPORT*  Clinical Data: Chest pain  CHEST - 2 VIEW  Comparison:  03/31/2012  Findings:  The heart size and mediastinal contours are within normal limits.  Both lungs are clear.  The visualized skeletal structures are unremarkable.  IMPRESSION: No active cardiopulmonary disease.   Original Report Authenticated By: Judie Petit. Miles Costain, M.D.    Date: 08/22/2012  Rate: 65  Rhythm: normal sinus rhythm  QRS Axis: normal  Intervals: normal  ST/T Wave abnormalities: normal  Conduction Disutrbances:none  Narrative Interpretation:   Old EKG Reviewed: none available   Labs Reviewed - No data to display No results found.   No diagnosis found. 1. Chest pain 2. Left neck pain    MDM  Re-exam:  She is still having pain in left neck, "feels like something is stuck". Negative soft tissue neck and chest x-rays, normal blood studies - doubt carotid dissection, ACS. Discussed with Dr. Bernette Mayers. Patient is stable for discharge.         Arnoldo Hooker, PA-C 08/22/12 1516

## 2012-08-22 NOTE — ED Notes (Signed)
While doing assessment Pt burped multilpe times . Pt denied any nausea.

## 2012-11-04 ENCOUNTER — Ambulatory Visit: Payer: Managed Care, Other (non HMO) | Admitting: Family Medicine

## 2012-11-04 VITALS — BP 120/80 | HR 71 | Temp 98.3°F | Resp 16 | Ht 64.0 in | Wt 293.0 lb

## 2012-11-04 DIAGNOSIS — L293 Anogenital pruritus, unspecified: Secondary | ICD-10-CM

## 2012-11-04 DIAGNOSIS — N760 Acute vaginitis: Secondary | ICD-10-CM

## 2012-11-04 DIAGNOSIS — N898 Other specified noninflammatory disorders of vagina: Secondary | ICD-10-CM

## 2012-11-04 DIAGNOSIS — B379 Candidiasis, unspecified: Secondary | ICD-10-CM

## 2012-11-04 DIAGNOSIS — B9689 Other specified bacterial agents as the cause of diseases classified elsewhere: Secondary | ICD-10-CM

## 2012-11-04 LAB — POCT URINALYSIS DIPSTICK
Bilirubin, UA: NEGATIVE
Glucose, UA: NEGATIVE
Spec Grav, UA: >=1.03
Urobilinogen, UA: 0.2

## 2012-11-04 LAB — POCT UA - MICROSCOPIC ONLY

## 2012-11-04 LAB — POCT WET PREP WITH KOH
KOH Prep POC: POSITIVE
RBC Wet Prep HPF POC: NEGATIVE
Trichomonas, UA: NEGATIVE
Yeast Wet Prep HPF POC: POSITIVE

## 2012-11-04 MED ORDER — FLUCONAZOLE 150 MG PO TABS: 150.0000 mg | ORAL_TABLET | Freq: Once | ORAL | Status: DC

## 2012-11-04 MED ORDER — METRONIDAZOLE 500 MG PO TABS: 500.0000 mg | ORAL_TABLET | Freq: Two times a day (BID) | ORAL | Status: DC

## 2012-11-04 NOTE — Progress Notes (Signed)
[facility]:  Office Visit  Chief Complaint:  Chief Complaint  Patient presents with  . Vaginal Itching    x 6 day    HPI: Tiffany Gallagher is a 47 y.o. female who complains of : 1. Vaginal itching x 6 days, odor and vaginal dc. She recently had sex with her partner and also tried a new natural oatmeal body wash. She had STD in her 85s but not since then. She denies douching or bathing, just takes showers. Has had BV before but that was a while back. She has had some mild pelvic pain but no f/c/n/v/back pain or dysuria  2. Rectal itching and scratching maybe at night, history of hemorrhoids, denies any constipation or straining at this time. Wants me to check if her hemorrhoid has thrombosed. She denies pain , just itching.    Past Medical History  Diagnosis Date  . GERD (gastroesophageal reflux disease)   . Hyperlipidemia   . Migraines   . Hypertension   . Smoker     HALF PPD  . Fibroid   . Anemia    Past Surgical History  Procedure Laterality Date  . Cesarean section  1995  . Vaginal hysterectomy  1998    partial   History   Social History  . Marital Status: Single    Spouse Name: N/A    Number of Children: 1  . Years of Education: 17   Occupational History  . customer service     Social History Main Topics  . Smoking status: Current Every Day Smoker -- 0.80 packs/day for 20 years    Types: Cigarettes  . Smokeless tobacco: Never Used     Comment: cutting back  . Alcohol Use: 0.0 oz/week     Comment: less than 1 glass a week  . Drug Use: No  . Sexually Active: Yes -- Female partner(s)    Birth Control/ Protection: Surgical, Condom   Other Topics Concern  . None   Social History Narrative   Exercise-- walking, abs dvd   Family History  Problem Relation Age of Onset  . Coronary artery disease Mother   . Hypertension Mother   . Kidney disease Mother 8    dialysis  . Aneurysm Mother     brain  . Stroke Mother   . Heart disease  Mother     MI  . Cancer Father 48    brain cancer  . Diabetes Sister   . Arthritis Brother   . Hypertension Brother   . Diabetes Brother   . Hypertension Brother    Allergies  Allergen Reactions  . Lipitor (Atorvastatin Calcium)     Nerves, muscle spasms, light stool   Prior to Admission medications   Medication Sig Start Date End Date Taking? Authorizing Provider  baclofen (LIORESAL) 10 MG tablet Take 10 mg by mouth at bedtime as needed (muscle spasms).   Yes Historical Provider, MD  benazepril (LOTENSIN) 10 MG tablet Take 1 tablet (10 mg total) by mouth daily. 04/01/12  Yes Yvonne R Lowne, DO  hydrochlorothiazide (HYDRODIURIL) 25 MG tablet Take 25 mg by mouth daily.   Yes Historical Provider, MD  Multiple Vitamin (MULTIVITAMIN WITH MINERALS) TABS Take 1 tablet by mouth daily.   Yes Historical Provider, MD  omeprazole (PRILOSEC) 20 MG capsule Take 1 capsule (20 mg total) by mouth daily. 04/01/12 07/31/13 Yes Yvonne R Lowne, DO  simethicone (MYLICON) 80 MG chewable tablet Chew 80 mg by mouth 3 (three) times daily  as needed for flatulence.    Yes Historical Provider, MD     ROS: The patient denies fevers, chills, night sweats, unintentional weight loss, chest pain, palpitations, wheezing, dyspnea on exertion, nausea, vomiting, abdominal pain, dysuria, hematuria, melena, numbness, weakness, or tingling.  All other systems have been reviewed and were otherwise negative with the exception of those mentioned in the HPI and as above.    PHYSICAL EXAM: Filed Vitals:   11/04/12 2014  BP: 120/80  Pulse: 71  Temp: 98.3 F (36.8 C)  Resp: 16   Filed Vitals:   11/04/12 2014  Height: 5\' 4"  (1.626 m)  Weight: 293 lb (132.904 kg)   Body mass index is 50.27 kg/(m^2).  General: Alert, no acute distress, morbidly obese AA female HEENT:  Normocephalic, atraumatic, oropharynx patent.  Cardiovascular:  Regular rate and rhythm, no rubs murmurs or gallops.  No Carotid bruits, radial pulse  intact. No pedal edema.  Respiratory: Clear to auscultation bilaterally.  No wheezes, rales, or rhonchi.  No cyanosis, no use of accessory musculature GI: No organomegaly, abdomen is soft and non-tender, positive bowel sounds.  No masses. Skin: No rashes. Neurologic: Facial musculature symmetric. Psychiatric: Patient is appropriate throughout our interaction. Lymphatic: No cervical lymphadenopathy Musculoskeletal: Gait intact. GU-+ vaginal dc, + malodor, + white-tan dc. No masses, lesions.  There is a rectal abrasion but no thrombosed hemorrhoids   LABS: Results for orders placed in visit on 11/04/12  POCT URINALYSIS DIPSTICK      Result Value Range   Color, UA yellow     Clarity, UA clear     Glucose, UA neg     Bilirubin, UA neg     Ketones, UA trace     Spec Grav, UA >=1.030     Blood, UA moderate     pH, UA 5.5     Protein, UA 30     Urobilinogen, UA 0.2     Nitrite, UA neg     Leukocytes, UA Negative    POCT UA - MICROSCOPIC ONLY      Result Value Range   WBC, Ur, HPF, POC 0-3     RBC, urine, microscopic 1-4     Bacteria, U Microscopic small     Mucus, UA trace     Epithelial cells, urine per micros 3-6     Crystals, Ur, HPF, POC neg     Casts, Ur, LPF, POC neg     Yeast, UA neg    POCT WET PREP WITH KOH      Result Value Range   Trichomonas, UA Negative     Clue Cells Wet Prep HPF POC 2-3     Epithelial Wet Prep HPF POC 10-20     Yeast Wet Prep HPF POC positive     Bacteria Wet Prep HPF POC 4+     RBC Wet Prep HPF POC neg     WBC Wet Prep HPF POC 1-6     KOH Prep POC Positive       EKG/XRAY:   Primary read interpreted by Dr. Conley Rolls at [facility].   ASSESSMENT/PLAN: Encounter Diagnoses  Name Primary?  . Itching in the vaginal area Yes  . Vaginal discharge   . Bacterial vaginosis   . Yeast infection    Tiffany Gallagher is a morbidly obese AA female who is with vaginal dc. Her affect is a little aloof today. She was recently seen in ER for CP and was told she had  GERD. She states  it was a lot of money for GERD dx so she is not excited about being at the doctor's again.   She ahs BV and yeast infection Will screen for STD, patient agreed for [facility] Rx  Diflucan and also Flagyl Use otc tucks/anusol prn for rectal itching Gross sideeffects, risk and benefits, and alternatives of medications d/w patient. Patient is aware that all medications have potential sideeffects and we are unable to predict every sideeffect or drug-drug interaction that may occur. F/u prn   Karuna Balducci PHUONG, DO 11/05/2012 11:27 AM

## 2012-11-04 NOTE — Patient Instructions (Signed)
Bacterial Vaginosis Bacterial vaginosis (BV) is a vaginal infection where the normal balance of bacteria in the vagina is disrupted. The normal balance is then replaced by an overgrowth of certain bacteria. There are several different kinds of bacteria that can cause BV. BV is the most common vaginal infection in women of childbearing age. CAUSES   The cause of BV is not fully understood. BV develops when there is an increase or imbalance of harmful bacteria.  Some activities or behaviors can upset the normal balance of bacteria in the vagina and put women at increased risk including:  Having a new sex partner or multiple sex partners.  Douching.  Using an intrauterine device (IUD) for contraception.  It is not clear what role sexual activity plays in the development of BV. However, women that have never had sexual intercourse are rarely infected with BV. Women do not get BV from toilet seats, bedding, swimming pools or from touching objects around them.  SYMPTOMS   Grey vaginal discharge.  A fish-like odor with discharge, especially after sexual intercourse.  Itching or burning of the vagina and vulva.  Burning or pain with urination.  Some women have no signs or symptoms at all. DIAGNOSIS  Your caregiver must examine the vagina for signs of BV. Your caregiver will perform lab tests and look at the sample of vaginal fluid through a microscope. They will look for bacteria and abnormal cells (clue cells), a pH test higher than 4.5, and a positive amine test all associated with BV.  RISKS AND COMPLICATIONS   Pelvic inflammatory disease (PID).  Infections following gynecology surgery.  Developing HIV.  Developing herpes virus. TREATMENT  Sometimes BV will clear up without treatment. However, all women with symptoms of BV should be treated to avoid complications, especially if gynecology surgery is planned. Female partners generally do not need to be treated. However, BV may spread  between female sex partners so treatment is helpful in preventing a recurrence of BV.   BV may be treated with antibiotics. The antibiotics come in either pill or vaginal cream forms. Either can be used with nonpregnant or pregnant women, but the recommended dosages differ. These antibiotics are not harmful to the baby.  BV can recur after treatment. If this happens, a second round of antibiotics will often be prescribed.  Treatment is important for pregnant women. If not treated, BV can cause a premature delivery, especially for a pregnant woman who had a premature birth in the past. All pregnant women who have symptoms of BV should be checked and treated.  For chronic reoccurrence of BV, treatment with a type of prescribed gel vaginally twice a week is helpful. HOME CARE INSTRUCTIONS   Finish all medication as directed by your caregiver.  Do not have sex until treatment is completed.  Tell your sexual partner that you have a vaginal infection. They should see their caregiver and be treated if they have problems, such as a mild rash or itching.  Practice safe sex. Use condoms. Only have 1 sex partner. PREVENTION  Basic prevention steps can help reduce the risk of upsetting the natural balance of bacteria in the vagina and developing BV:  Do not have sexual intercourse (be abstinent).  Do not douche.  Use all of the medicine prescribed for treatment of BV, even if the signs and symptoms go away.  Tell your sex partner if you have BV. That way, they can be treated, if needed, to prevent reoccurrence. SEEK MEDICAL CARE IF:     Your symptoms are not improving after 3 days of treatment.  You have increased discharge, pain, or fever. MAKE SURE YOU:   Understand these instructions.  Will watch your condition.  Will get help right away if you are not doing well or get worse. FOR MORE INFORMATION  Division of STD Prevention (DSTDP), Centers for Disease Control and Prevention:  www.cdc.gov/std American [employer] Association (ASHA): www.ashastd.org  Document Released: 04/07/2005 Document Revised: 06/30/2011 Document Reviewed: 09/28/2008 [employer] Patient Information 2014 [employer].  

## 2012-11-06 LAB — GC/CHLAMYDIA PROBE AMP
CT Probe RNA: NEGATIVE
GC Probe RNA: NEGATIVE

## 2012-11-29 ENCOUNTER — Ambulatory Visit
Admission: RE | Admit: 2012-11-29 | Discharge: 2012-11-29 | Disposition: A | Discharge status: Home / Self Care | Payer: Managed Care, Other (non HMO) | Source: Ambulatory Visit

## 2012-11-29 DIAGNOSIS — Z1231 Encounter for screening mammogram for malignant neoplasm of breast: Secondary | ICD-10-CM

## 2013-01-18 ENCOUNTER — Telehealth: Payer: Self-pay

## 2013-01-18 ENCOUNTER — Encounter: Payer: Self-pay | Admitting: Family Medicine

## 2013-01-18 ENCOUNTER — Ambulatory Visit (INDEPENDENT_AMBULATORY_CARE_PROVIDER_SITE_OTHER): Payer: Managed Care, Other (non HMO) | Admitting: Family Medicine

## 2013-01-18 VITALS — BP 126/72 | HR 72 | Temp 98.2°F | Ht 65.5 in | Wt 294.0 lb

## 2013-01-18 DIAGNOSIS — K219 Gastro-esophageal reflux disease without esophagitis: Secondary | ICD-10-CM

## 2013-01-18 DIAGNOSIS — M25561 Pain in right knee: Secondary | ICD-10-CM

## 2013-01-18 DIAGNOSIS — R200 Anesthesia of skin: Secondary | ICD-10-CM

## 2013-01-18 DIAGNOSIS — E669 Obesity, unspecified: Secondary | ICD-10-CM

## 2013-01-18 DIAGNOSIS — R319 Hematuria, unspecified: Secondary | ICD-10-CM

## 2013-01-18 DIAGNOSIS — Z0000 Encounter for general adult medical examination without abnormal findings: Secondary | ICD-10-CM

## 2013-01-18 DIAGNOSIS — I10 Essential (primary) hypertension: Secondary | ICD-10-CM

## 2013-01-18 DIAGNOSIS — R209 Unspecified disturbances of skin sensation: Secondary | ICD-10-CM

## 2013-01-18 DIAGNOSIS — M25569 Pain in unspecified knee: Secondary | ICD-10-CM

## 2013-01-18 DIAGNOSIS — M62838 Other muscle spasm: Secondary | ICD-10-CM

## 2013-01-18 DIAGNOSIS — R1011 Right upper quadrant pain: Secondary | ICD-10-CM

## 2013-01-18 LAB — POCT URINALYSIS DIPSTICK
Nitrite, UA: NEGATIVE
Urobilinogen, UA: 0.2
pH, UA: 7.5

## 2013-01-18 MED ORDER — OMEPRAZOLE 20 MG PO CPDR: 20.0000 mg | DELAYED_RELEASE_CAPSULE | Freq: Every day | ORAL | Status: DC

## 2013-01-18 MED ORDER — BENAZEPRIL HCL 10 MG PO TABS: 10.0000 mg | ORAL_TABLET | Freq: Every day | ORAL | Status: DC

## 2013-01-18 MED ORDER — HYDROCHLOROTHIAZIDE 25 MG PO TABS: 25.0000 mg | ORAL_TABLET | Freq: Every day | ORAL | Status: DC

## 2013-01-18 MED ORDER — DIETHYLPROPION HCL ER 75 MG PO TB24: 1.0000 | ORAL_TABLET | Freq: Every day | ORAL | Status: DC

## 2013-01-18 MED ORDER — BACLOFEN 10 MG PO TABS: 10.0000 mg | ORAL_TABLET | Freq: Every evening | ORAL | Status: DC | PRN

## 2013-01-18 MED ORDER — MELOXICAM 15 MG PO TABS: 15.0000 mg | ORAL_TABLET | Freq: Every day | ORAL | Status: DC

## 2013-01-18 NOTE — Progress Notes (Signed)
Subjective:     Tiffany Gallagher is a 47 y.o. female and is here for a comprehensive physical exam. The patient reports inc in heartburn symptoms.  History   Social History  . Marital Status: Single    Spouse Name: N/A    Number of Children: 1  . Years of Education: 17   Occupational History  . customer service     Social History Main Topics  . Smoking status: Current Every Day Smoker -- 0.50 packs/day for 20 years    Types: Cigarettes  . Smokeless tobacco: Never Used     Comment: cutting back  . Alcohol Use: 0.0 oz/week     Comment: less than 1 glass a week  . Drug Use: No  . Sexual Activity: Yes    Partners: Male    Birth Control/ Protection: Surgical, Condom   Other Topics Concern  . Not on file   Social History Narrative   Exercise-- no   Health Maintenance  Topic Date Due  . Influenza Vaccine  01/18/2014  . Tetanus/tdap  02/06/2018    The following portions of the patient's history were reviewed and updated as appropriate:  She  has a past medical history of GERD (gastroesophageal reflux disease); Hyperlipidemia; Migraines; Hypertension; Smoker; Fibroid; and Anemia. She  does not have any pertinent problems on file. She  has past surgical history that includes Cesarean section (1995) and Vaginal hysterectomy (1998). Her family history includes Aneurysm in her mother; Arthritis in her brother; Cancer (age of onset: 48) in her father; Coronary artery disease in her mother; Diabetes in her brother and sister; Heart disease in her mother; Hypertension in her brother, brother, and mother; Kidney disease (age of onset: 49) in her mother; Stroke in her mother. She  reports that she has been smoking Cigarettes.  She has a 10 pack-year smoking history. She has never used smokeless tobacco. She reports that  drinks alcohol. She reports that she does not use illicit drugs. She has a current medication list which includes the following prescription(s): baclofen, benazepril,  hydrochlorothiazide, multivitamin with minerals, omeprazole, and simethicone. Current Outpatient Prescriptions on File Prior to Visit  Medication Sig Dispense Refill  . Multiple Vitamin (MULTIVITAMIN WITH MINERALS) TABS Take 1 tablet by mouth daily.      . simethicone (MYLICON) 80 MG chewable tablet Chew 80 mg by mouth 3 (three) times daily as needed for flatulence.        No current facility-administered medications on file prior to visit.   She is allergic to lipitor..  Review of Systems Review of Systems  Constitutional: Negative for activity change, appetite change and fatigue.  HENT: Negative for hearing loss, congestion, tinnitus and ear discharge.  dentist q14m Eyes: Negative for visual disturbance (see optho q1y -- vision corrected to 20/20 with glasses).  Respiratory: Negative for cough, chest tightness and shortness of breath.   Cardiovascular: Negative for chest pain, palpitations and leg swelling.  Gastrointestinal: Negative for abdominal pain, diarrhea, constipation and abdominal distention, inc in heartburn Genitourinary: Negative for urgency, frequency, decreased urine volume and difficulty urinating.  Musculoskeletal: Negative for back pain, arthralgias and gait problem.  Skin: Negative for color change, pallor and rash.  Neurological: Negative for dizziness, light-headedness, numbness and headaches.  Hematological: Negative for adenopathy. Does not bruise/bleed easily.  Psychiatric/Behavioral: Negative for suicidal ideas, confusion, sleep disturbance, self-injury, dysphoric mood, decreased concentration and agitation.       Objective:    BP 126/72  Pulse 72  Temp(Src) 98.2 F (  36.8 C) (Oral)  Ht 5' 5.5" (1.664 m)  Wt 294 lb (133.358 kg)  BMI 48.16 kg/m2  SpO2 97% General appearance: alert, cooperative, appears stated age and no distress Head: Normocephalic, without obvious abnormality, atraumatic Eyes: conjunctivae/corneas clear. PERRL, EOM's intact. Fundi  benign. Ears: normal TM's and external ear canals both ears Nose: Nares normal. Septum midline. Mucosa normal. No drainage or sinus tenderness. Throat: lips, mucosa, and tongue normal; teeth and gums normal Neck: no adenopathy, no carotid bruit, no JVD, supple, symmetrical, trachea midline and thyroid not enlarged, symmetric, no tenderness/mass/nodules Back: symmetric, no curvature. ROM normal. No CVA tenderness. Lungs: clear to auscultation bilaterally Breasts: normal appearance, no masses or tenderness Heart: regular rate and rhythm, S1, S2 normal, no murmur, click, rub or gallop Abdomen: soft, non-tender; bowel sounds normal; no masses,  no organomegaly Pelvic: deferred Extremities: extremities normal, atraumatic, no cyanosis or edema Pulses: 2+ and symmetric Skin: Skin color, texture, turgor normal. No rashes or lesions Lymph nodes: Cervical, supraclavicular, and axillary nodes normal. Neurologic: Alert and oriented X 3, normal strength and tone. Normal symmetric reflexes. Normal coordination and gait Psych--no depression, no anxiety      Assessment:    Healthy female exam.      Plan:    check labs ghm utd See avs See After Visit Summary for Counseling Recommendations  Screening colon

## 2013-01-18 NOTE — Assessment & Plan Note (Signed)
Inc in symptoms Refer to GI

## 2013-01-18 NOTE — Patient Instructions (Addendum)
Preventive Care for Adults, Female A healthy lifestyle and preventive care can promote health and wellness. Preventive health guidelines for women include the following key practices.  A routine yearly physical is a good way to check with your caregiver about your health and preventive screening. It is a chance to share any concerns and updates on your health, and to receive a thorough exam.  Visit your dentist for a routine exam and preventive care every 6 months. Brush your teeth twice a day and floss once a day. Good oral hygiene prevents tooth decay and gum disease.  The frequency of eye exams is based on your age, health, family medical history, use of contact lenses, and other factors. Follow your caregiver's recommendations for frequency of eye exams.  Eat a healthy diet. Foods like vegetables, fruits, whole grains, low-fat dairy products, and lean protein foods contain the nutrients you need without too many calories. Decrease your intake of foods high in solid fats, added sugars, and salt. Eat the right amount of calories for you.Get information about a proper diet from your caregiver, if necessary.  Regular physical exercise is one of the most important things you can do for your health. Most adults should get at least 150 minutes of moderate-intensity exercise (any activity that increases your heart rate and causes you to sweat) each week. In addition, most adults need muscle-strengthening exercises on 2 or more days a week.  Maintain a healthy weight. The body mass index (BMI) is a screening tool to identify possible weight problems. It provides an estimate of body fat based on height and weight. Your caregiver can help determine your BMI, and can help you achieve or maintain a healthy weight.For adults 20 years and older:  A BMI below 18.5 is considered underweight.  A BMI of 18.5 to 24.9 is normal.  A BMI of 25 to 29.9 is considered overweight.  A BMI of 30 and above is  considered obese.  Maintain normal blood lipids and cholesterol levels by exercising and minimizing your intake of saturated fat. Eat a balanced diet with plenty of fruit and vegetables. Blood tests for lipids and cholesterol should begin at age 20 and be repeated every 5 years. If your lipid or cholesterol levels are high, you are over 50, or you are at high risk for heart disease, you may need your cholesterol levels checked more frequently.Ongoing high lipid and cholesterol levels should be treated with medicines if diet and exercise are not effective.  If you smoke, find out from your caregiver how to quit. If you do not use tobacco, do not start.  If you are pregnant, do not drink alcohol. If you are breastfeeding, be very cautious about drinking alcohol. If you are not pregnant and choose to drink alcohol, do not exceed 1 drink per day. One drink is considered to be 12 ounces (355 mL) of beer, 5 ounces (148 mL) of wine, or 1.5 ounces (44 mL) of liquor.  Avoid use of street drugs. Do not share needles with anyone. Ask for help if you need support or instructions about stopping the use of drugs.  High blood pressure causes heart disease and increases the risk of stroke. Your blood pressure should be checked at least every 1 to 2 years. Ongoing high blood pressure should be treated with medicines if weight loss and exercise are not effective.  If you are 47 to 47 years old, ask your caregiver if you should take aspirin to prevent strokes.  Diabetes   screening involves taking a blood sample to check your fasting blood sugar level. This should be done once every 3 years, after age 47, if you are within normal weight and without risk factors for diabetes. Testing should be considered at a younger age or be carried out more frequently if you are overweight and have at least 1 risk factor for diabetes.  Breast cancer screening is essential preventive care for women. You should practice "breast  self-awareness." This means understanding the normal appearance and feel of your breasts and may include breast self-examination. Any changes detected, no matter how small, should be reported to a caregiver. Women in their 20s and 30s should have a clinical breast exam (CBE) by a caregiver as part of a regular health exam every 1 to 3 years. After age 47, women should have a CBE every year. Starting at age 47, women should consider having a mammography (breast X-ray test) every year. Women who have a family history of breast cancer should talk to their caregiver about genetic screening. Women at a high risk of breast cancer should talk to their caregivers about having magnetic resonance imaging (MRI) and a mammography every year.  The Pap test is a screening test for cervical cancer. A Pap test can show cell changes on the cervix that might become cervical cancer if left untreated. A Pap test is a procedure in which cells are obtained and examined from the lower end of the uterus (cervix).  Women should have a Pap test starting at age 21.  Between ages 21 and 29, Pap tests should be repeated every 2 years.  Beginning at age 30, you should have a Pap test every 3 years as long as the past 3 Pap tests have been normal.  Some women have medical problems that increase the chance of getting cervical cancer. Talk to your caregiver about these problems. It is especially important to talk to your caregiver if a new problem develops soon after your last Pap test. In these cases, your caregiver may recommend more frequent screening and Pap tests.  The above recommendations are the same for women who have or have not gotten the vaccine for human papillomavirus (HPV).  If you had a hysterectomy for a problem that was not cancer or a condition that could lead to cancer, then you no longer need Pap tests. Even if you no longer need a Pap test, a regular exam is a good idea to make sure no other problems are  starting.  If you are between ages 65 and 70, and you have had normal Pap tests going back 10 years, you no longer need Pap tests. Even if you no longer need a Pap test, a regular exam is a good idea to make sure no other problems are starting.  If you have had past treatment for cervical cancer or a condition that could lead to cancer, you need Pap tests and screening for cancer for at least 20 years after your treatment.  If Pap tests have been discontinued, risk factors (such as a new sexual partner) need to be reassessed to determine if screening should be resumed.  The HPV test is an additional test that may be used for cervical cancer screening. The HPV test looks for the virus that can cause the cell changes on the cervix. The cells collected during the Pap test can be tested for HPV. The HPV test could be used to screen women aged 30 years and older, and should   be used in women of any age who have unclear Pap test results. After the age of 30, women should have HPV testing at the same frequency as a Pap test.  Colorectal cancer can be detected and often prevented. Most routine colorectal cancer screening begins at the age of 50 and continues through age 75. However, your caregiver may recommend screening at an earlier age if you have risk factors for colon cancer. On a yearly basis, your caregiver may provide home test kits to check for hidden blood in the stool. Use of a small camera at the end of a tube, to directly examine the colon (sigmoidoscopy or colonoscopy), can detect the earliest forms of colorectal cancer. Talk to your caregiver about this at age 50, when routine screening begins. Direct examination of the colon should be repeated every 5 to 10 years through age 75, unless early forms of pre-cancerous polyps or small growths are found.  Hepatitis C blood testing is recommended for all people born from 1945 through 1965 and any individual with known risks for hepatitis C.  Practice  safe sex. Use condoms and avoid high-risk sexual practices to reduce the spread of sexually transmitted infections (STIs). STIs include gonorrhea, chlamydia, syphilis, trichomonas, herpes, HPV, and human immunodeficiency virus (HIV). Herpes, HIV, and HPV are viral illnesses that have no cure. They can result in disability, cancer, and death. Sexually active women aged 25 and younger should be checked for chlamydia. Older women with new or multiple partners should also be tested for chlamydia. Testing for other STIs is recommended if you are sexually active and at increased risk.  Osteoporosis is a disease in which the bones lose minerals and strength with aging. This can result in serious bone fractures. The risk of osteoporosis can be identified using a bone density scan. Women ages 65 and over and women at risk for fractures or osteoporosis should discuss screening with their caregivers. Ask your caregiver whether you should take a calcium supplement or vitamin D to reduce the rate of osteoporosis.  Menopause can be associated with physical symptoms and risks. Hormone replacement therapy is available to decrease symptoms and risks. You should talk to your caregiver about whether hormone replacement therapy is right for you.  Use sunscreen with sun protection factor (SPF) of 30 or more. Apply sunscreen liberally and repeatedly throughout the day. You should seek shade when your shadow is shorter than you. Protect yourself by wearing long sleeves, pants, a wide-brimmed hat, and sunglasses year round, whenever you are outdoors.  Once a month, do a whole body skin exam, using a mirror to look at the skin on your back. Notify your caregiver of new moles, moles that have irregular borders, moles that are larger than a pencil eraser, or moles that have changed in shape or color.  Stay current with required immunizations.  Influenza. You need a dose every fall (or winter). The composition of the flu vaccine  changes each year, so being vaccinated once is not enough.  Pneumococcal polysaccharide. You need 1 to 2 doses if you smoke cigarettes or if you have certain chronic medical conditions. You need 1 dose at age 65 (or older) if you have never been vaccinated.  Tetanus, diphtheria, pertussis (Tdap, Td). Get 1 dose of Tdap vaccine if you are younger than age 65, are over 65 and have contact with an infant, are a healthcare worker, are pregnant, or simply want to be protected from whooping cough. After that, you need a Td   booster dose every 10 years. Consult your caregiver if you have not had at least 3 tetanus and diphtheria-containing shots sometime in your life or have a deep or dirty wound.  HPV. You need this vaccine if you are a woman age 26 or younger. The vaccine is given in 3 doses over 6 months.  Measles, mumps, rubella (MMR). You need at least 1 dose of MMR if you were born in 1957 or later. You may also need a second dose.  Meningococcal. If you are age 19 to 21 and a first-year college student living in a residence hall, or have one of several medical conditions, you need to get vaccinated against meningococcal disease. You may also need additional booster doses.  Zoster (shingles). If you are age 60 or older, you should get this vaccine.  Varicella (chickenpox). If you have never had chickenpox or you were vaccinated but received only 1 dose, talk to your caregiver to find out if you need this vaccine.  Hepatitis A. You need this vaccine if you have a specific risk factor for hepatitis A virus infection or you simply wish to be protected from this disease. The vaccine is usually given as 2 doses, 6 to 18 months apart.  Hepatitis B. You need this vaccine if you have a specific risk factor for hepatitis B virus infection or you simply wish to be protected from this disease. The vaccine is given in 3 doses, usually over 6 months. Preventive Services / Frequency Ages 19 to 39  Blood  pressure check.** / Every 1 to 2 years.  Lipid and cholesterol check.** / Every 5 years beginning at age 20.  Clinical breast exam.** / Every 3 years for women in their 20s and 30s.  Pap test.** / Every 2 years from ages 21 through 29. Every 3 years starting at age 30 through age 65 or 70 with a history of 3 consecutive normal Pap tests.  HPV screening.** / Every 3 years from ages 30 through ages 65 to 70 with a history of 3 consecutive normal Pap tests.  Hepatitis C blood test.** / For any individual with known risks for hepatitis C.  Skin self-exam. / Monthly.  Influenza immunization.** / Every year.  Pneumococcal polysaccharide immunization.** / 1 to 2 doses if you smoke cigarettes or if you have certain chronic medical conditions.  Tetanus, diphtheria, pertussis (Tdap, Td) immunization. / A one-time dose of Tdap vaccine. After that, you need a Td booster dose every 10 years.  HPV immunization. / 3 doses over 6 months, if you are 26 and younger.  Measles, mumps, rubella (MMR) immunization. / You need at least 1 dose of MMR if you were born in 1957 or later. You may also need a second dose.  Meningococcal immunization. / 1 dose if you are age 19 to 21 and a first-year college student living in a residence hall, or have one of several medical conditions, you need to get vaccinated against meningococcal disease. You may also need additional booster doses.  Varicella immunization.** / Consult your caregiver.  Hepatitis A immunization.** / Consult your caregiver. 2 doses, 6 to 18 months apart.  Hepatitis B immunization.** / Consult your caregiver. 3 doses usually over 6 months. Ages 40 to 64  Blood pressure check.** / Every 1 to 2 years.  Lipid and cholesterol check.** / Every 5 years beginning at age 20.  Clinical breast exam.** / Every year after age 40.  Mammogram.** / Every year beginning at age 40   and continuing for as long as you are in good health. Consult with your  caregiver.  Pap test.** / Every 3 years starting at age 30 through age 65 or 70 with a history of 3 consecutive normal Pap tests.  HPV screening.** / Every 3 years from ages 30 through ages 65 to 70 with a history of 3 consecutive normal Pap tests.  Fecal occult blood test (FOBT) of stool. / Every year beginning at age 50 and continuing until age 75. You may not need to do this test if you get a colonoscopy every 10 years.  Flexible sigmoidoscopy or colonoscopy.** / Every 5 years for a flexible sigmoidoscopy or every 10 years for a colonoscopy beginning at age 50 and continuing until age 75.  Hepatitis C blood test.** / For all people born from 1945 through 1965 and any individual with known risks for hepatitis C.  Skin self-exam. / Monthly.  Influenza immunization.** / Every year.  Pneumococcal polysaccharide immunization.** / 1 to 2 doses if you smoke cigarettes or if you have certain chronic medical conditions.  Tetanus, diphtheria, pertussis (Tdap, Td) immunization.** / A one-time dose of Tdap vaccine. After that, you need a Td booster dose every 10 years.  Measles, mumps, rubella (MMR) immunization. / You need at least 1 dose of MMR if you were born in 1957 or later. You may also need a second dose.  Varicella immunization.** / Consult your caregiver.  Meningococcal immunization.** / Consult your caregiver.  Hepatitis A immunization.** / Consult your caregiver. 2 doses, 6 to 18 months apart.  Hepatitis B immunization.** / Consult your caregiver. 3 doses, usually over 6 months. Ages 65 and over  Blood pressure check.** / Every 1 to 2 years.  Lipid and cholesterol check.** / Every 5 years beginning at age 20.  Clinical breast exam.** / Every year after age 40.  Mammogram.** / Every year beginning at age 40 and continuing for as long as you are in good health. Consult with your caregiver.  Pap test.** / Every 3 years starting at age 30 through age 65 or 70 with a 3  consecutive normal Pap tests. Testing can be stopped between 65 and 70 with 3 consecutive normal Pap tests and no abnormal Pap or HPV tests in the past 10 years.  HPV screening.** / Every 3 years from ages 30 through ages 65 or 70 with a history of 3 consecutive normal Pap tests. Testing can be stopped between 65 and 70 with 3 consecutive normal Pap tests and no abnormal Pap or HPV tests in the past 10 years.  Fecal occult blood test (FOBT) of stool. / Every year beginning at age 50 and continuing until age 75. You may not need to do this test if you get a colonoscopy every 10 years.  Flexible sigmoidoscopy or colonoscopy.** / Every 5 years for a flexible sigmoidoscopy or every 10 years for a colonoscopy beginning at age 50 and continuing until age 75.  Hepatitis C blood test.** / For all people born from 1945 through 1965 and any individual with known risks for hepatitis C.  Osteoporosis screening.** / A one-time screening for women ages 65 and over and women at risk for fractures or osteoporosis.  Skin self-exam. / Monthly.  Influenza immunization.** / Every year.  Pneumococcal polysaccharide immunization.** / 1 dose at age 65 (or older) if you have never been vaccinated.  Tetanus, diphtheria, pertussis (Tdap, Td) immunization. / A one-time dose of Tdap vaccine if you are over   65 and have contact with an infant, are a healthcare worker, or simply want to be protected from whooping cough. After that, you need a Td booster dose every 10 years.  Varicella immunization.** / Consult your caregiver.  Meningococcal immunization.** / Consult your caregiver.  Hepatitis A immunization.** / Consult your caregiver. 2 doses, 6 to 18 months apart.  Hepatitis B immunization.** / Check with your caregiver. 3 doses, usually over 6 months. ** Family history and personal history of risk and conditions may change your caregiver's recommendations. Document Released: 06/03/2001 Document Revised: 06/30/2011  Document Reviewed: 09/02/2010 [employer] Patient Information 2014 [employer].  

## 2013-01-18 NOTE — Assessment & Plan Note (Signed)
D/w pt exercise and diet

## 2013-01-18 NOTE — Assessment & Plan Note (Signed)
Stable con't meds 

## 2013-01-18 NOTE — Telephone Encounter (Signed)
Message copied by Arnette Norris on Tue Jan 18, 2013  2:21 PM ------      Message from: Lelon Perla      Created: Tue Jan 18, 2013  1:43 PM       Please cancel omeprazole , lotensin and hctz at [facility] ------

## 2013-01-18 NOTE — Telephone Encounter (Signed)
Detailed message left advising to cancel all three medications on [facility].     KP

## 2013-01-19 LAB — CBC WITH DIFFERENTIAL/PLATELET
Basophils Absolute: 0 10*3/uL (ref 0.0–0.1)
HCT: 36.8 % (ref 36.0–46.0)
Lymphs Abs: 2.7 10*3/uL (ref 0.7–4.0)
MCV: 93.1 fl (ref 78.0–100.0)
Monocytes Absolute: 0.3 10*3/uL (ref 0.1–1.0)
Monocytes Relative: 4.6 % (ref 3.0–12.0)
Platelets: 307 10*3/uL (ref 150.0–400.0)
RDW: 13.9 % (ref 11.5–14.6)

## 2013-01-19 LAB — TSH: TSH: 0.96 u[IU]/mL (ref 0.35–5.50)

## 2013-01-19 LAB — LIPID PANEL
Cholesterol: 218 mg/dL — ABNORMAL HIGH (ref 0–200)
HDL: 42.7 mg/dL (ref >39.00)
Total CHOL/HDL Ratio: 5
Triglycerides: 138 mg/dL (ref 0.0–149.0)
VLDL: 27.6 mg/dL (ref 0.0–40.0)

## 2013-01-19 LAB — BASIC METABOLIC PANEL
BUN: 12 mg/dL (ref 6–23)
GFR: 135.15 mL/min (ref >60.00)
Glucose, Bld: 69 mg/dL — ABNORMAL LOW (ref 70–99)
Potassium: 3.4 mEq/L — ABNORMAL LOW (ref 3.5–5.1)

## 2013-01-19 LAB — HEPATIC FUNCTION PANEL: Total Bilirubin: 0.7 mg/dL (ref 0.3–1.2)

## 2013-01-20 LAB — URINE CULTURE: Colony Count: >=100000

## 2013-01-20 LAB — LDL CHOLESTEROL, DIRECT: Direct LDL: 151.6 mg/dL

## 2013-02-04 MED ORDER — SIMVASTATIN 20 MG PO TABS: 20.0000 mg | ORAL_TABLET | Freq: Every day | ORAL | Status: DC

## 2013-03-04 ENCOUNTER — Encounter: Payer: Self-pay | Admitting: Family Medicine

## 2013-03-21 ENCOUNTER — Encounter: Payer: Self-pay | Admitting: Family Medicine

## 2013-03-31 ENCOUNTER — Telehealth: Payer: Self-pay | Admitting: Gastroenterology

## 2013-03-31 NOTE — Telephone Encounter (Signed)
The pt says that she was told she needed "an xray" for GERD.  I advised the pt that she has not been seen in 2 years and would need an office visit to discuss symptoms.  She again asked if we could go ahead and order the xray that her PCP recommended.  I told the pt that I am unsure what xray that would be and to have her PCP call us to set up an appt and discuss what xray they are talking about.  Pt agreed

## 2013-04-26 ENCOUNTER — Ambulatory Visit: Payer: BC Managed Care – PPO | Admitting: Family Medicine

## 2013-04-26 VITALS — BP 126/78 | HR 89 | Temp 99.2°F | Resp 17 | Ht 66.0 in | Wt 296.0 lb

## 2013-04-26 DIAGNOSIS — N898 Other specified noninflammatory disorders of vagina: Secondary | ICD-10-CM

## 2013-04-26 DIAGNOSIS — Z202 Contact with and (suspected) exposure to infections with a predominantly sexual mode of transmission: Secondary | ICD-10-CM

## 2013-04-26 DIAGNOSIS — R319 Hematuria, unspecified: Secondary | ICD-10-CM

## 2013-04-26 DIAGNOSIS — B3731 Acute candidiasis of vulva and vagina: Secondary | ICD-10-CM

## 2013-04-26 DIAGNOSIS — Z2089 Contact with and (suspected) exposure to other communicable diseases: Secondary | ICD-10-CM

## 2013-04-26 DIAGNOSIS — B373 Candidiasis of vulva and vagina: Secondary | ICD-10-CM

## 2013-04-26 LAB — POCT URINALYSIS DIPSTICK
Bilirubin, UA: NEGATIVE
Glucose, UA: NEGATIVE
Ketones, UA: NEGATIVE
Leukocytes, UA: NEGATIVE
Nitrite, UA: NEGATIVE
PROTEIN UA: NEGATIVE
Spec Grav, UA: 1.01
UROBILINOGEN UA: 0.2
pH, UA: 6

## 2013-04-26 LAB — POCT UA - MICROSCOPIC ONLY
CRYSTALS, UR, HPF, POC: NEGATIVE
Casts, Ur, LPF, POC: NEGATIVE
Epithelial cells, urine per micros: NEGATIVE
Mucus, UA: NEGATIVE
Yeast, UA: NEGATIVE

## 2013-04-26 LAB — POCT WET PREP WITH KOH
Clue Cells Wet Prep HPF POC: NEGATIVE
KOH PREP POC: POSITIVE
RBC Wet Prep HPF POC: NEGATIVE
TRICHOMONAS UA: NEGATIVE
Yeast Wet Prep HPF POC: NEGATIVE

## 2013-04-26 MED ORDER — FLUCONAZOLE 150 MG PO TABS: 150.0000 mg | ORAL_TABLET | Freq: Once | ORAL | Status: DC

## 2013-04-26 NOTE — Progress Notes (Addendum)
Subjective:    Patient ID: Tiffany Gallagher, female    DOB: [birth year], 48 y.o.   MRN: [id number] This chart was scribed for Merri Ray, MD by Vernell Barrier, Medical Scribe. This patient's care was started at 8:32 PM.  HPI HPI Comments: Tiffany Gallagher is a 48 y.o. female who presents to the [facility] complaining of vaginal itching and mild discharge. Thick white discharge for approximately 1 week that has resolved slightly. Also reports abdominal and back pain. Pt called [facility] and was prescribed Flagyl that she has been on consistently for 5 days. She was also prescribed Flucanozole, has taken 1 since prescription. Pt took the OTC 3 day yeast depositories before calling [facility].  Pt denies nausea or vomiting, dysuria, frequency, difficulty urinating, burning with urination. New sexual partner; pt states it is her boyfriend. Pt has had Chlamydia in the past, but when younger, no recent sti.  Last time she was tested for STDs was 5 years ago. Admits to 2 total partners since last testing. Pt states she uses condoms regularly but admits to some instances without. Pt had a hysterectomy in 1998  Intermittent abdominal soreness past few months. Hx of GERd, seen by GI 2 years ago. Takes PPI. No fever, no new or worsening abd pain, occasional nausea.    Patient Active Problem List   Diagnosis Date Noted  . Obesity (BMI 30-39.9) 01/18/2013  . Migraines   . Hypertension   . Smoker   . Sinusitis 04/11/2011  . Leg cramps 09/18/2010  . Edema 09/18/2010  . VAGINAL DISCHARGE 09/20/2009  . KNEE PAIN, BILATERAL 09/20/2009  . FLATULENCE ERUCTATION AND GAS PAIN 08/03/2009  . HEMATURIA, HX OF 07/04/2009  . HYPERTENSION 02/07/2008  . OBESITY NOS 11/30/2006  . GERD 11/30/2006   Past Medical History  Diagnosis Date  . GERD (gastroesophageal reflux disease)   . Hyperlipidemia   . Migraines   . Hypertension   . Smoker     HALF PPD  . Fibroid   . Anemia    Past Surgical  History  Procedure Laterality Date  . Cesarean section  1995  . Vaginal hysterectomy  1998    partial   Allergies  Allergen Reactions  . Lipitor [Atorvastatin Calcium]     Nerves, muscle spasms, light stool   Prior to Admission medications   Medication Sig Start Date End Date Taking? Authorizing Provider  baclofen (LIORESAL) 10 MG tablet Take 1 tablet (10 mg total) by mouth at bedtime as needed (muscle spasms). 01/18/13  Yes Yvonne R Lowne, DO  benazepril (LOTENSIN) 10 MG tablet Take 1 tablet (10 mg total) by mouth daily. 01/18/13  Yes Alferd Apa Lowne, DO  Diethylpropion HCl CR 75 MG TB24 Take 1 tablet (75 mg total) by mouth daily. 01/18/13  Yes Yvonne R Lowne, DO  hydrochlorothiazide (HYDRODIURIL) 25 MG tablet Take 1 tablet (25 mg total) by mouth daily. 01/18/13  Yes Alferd Apa Lowne, DO  meloxicam (MOBIC) 15 MG tablet Take 1 tablet (15 mg total) by mouth daily. 01/18/13  Yes Yvonne R Lowne, DO  metroNIDAZOLE (FLAGYL) 500 MG tablet Take 500 mg by mouth 3 (three) times daily.   Yes Historical Provider, MD  Multiple Vitamin (MULTIVITAMIN WITH MINERALS) TABS Take 1 tablet by mouth daily.   Yes Historical Provider, MD  omeprazole (PRILOSEC) 20 MG capsule Take 1 capsule (20 mg total) by mouth daily. 01/18/13 05/19/14 Yes Yvonne R Lowne, DO  simethicone (MYLICON) 80 MG chewable tablet Chew  80 mg by mouth 3 (three) times daily as needed for flatulence.    Yes Historical Provider, MD  simvastatin (ZOCOR) 20 MG tablet Take 1 tablet (20 mg total) by mouth at bedtime. 02/04/13  Yes Rosalita Chessman, DO   History   Social History  . Marital Status: Single    Spouse Name: N/A    Number of Children: 1  . Years of Education: 17   Occupational History  . customer service     Social History Main Topics  . Smoking status: Current Every Day Smoker -- 0.50 packs/day for 30 years    Types: Cigarettes  . Smokeless tobacco: Never Used     Comment: cutting back  . Alcohol Use: 0.0 oz/week     Comment: less  than 1 glass a week  . Drug Use: No  . Sexual Activity: Yes    Partners: Male    Birth Control/ Protection: Surgical, Condom   Other Topics Concern  . Not on file   Social History Narrative   Exercise-- no   Review of Systems  Gastrointestinal: Negative for nausea and vomiting.  Genitourinary: Positive for vaginal discharge. Negative for difficulty urinating.  Musculoskeletal: Positive for back pain.      Objective:   Physical Exam  Vitals reviewed. Constitutional: She is oriented to person, place, and time. She appears well-developed and well-nourished. No distress.  HENT:  Head: Normocephalic and atraumatic.  Right Ear: Hearing, tympanic membrane, external ear and ear canal normal.  Left Ear: Hearing, tympanic membrane, external ear and ear canal normal.  Nose: Nose normal.  Mouth/Throat: Oropharynx is clear and moist. No oropharyngeal exudate.  Eyes: Conjunctivae and EOM are normal. Pupils are equal, round, and reactive to light.  Cardiovascular: Normal rate, regular rhythm, normal heart sounds and intact distal pulses.   No murmur heard. Pulmonary/Chest: Effort normal and breath sounds normal. No respiratory distress. She has no wheezes. She has no rhonchi.  Abdominal: Soft. There is tenderness. There is no rebound and no guarding.  Minimal epigastric tenderness. Slight lower right and left tenderness. Negative Murphy's sign.   Genitourinary:  No visible external rash. Thick white discharge seen in vaginal vault without internal lesions. difficult visualization of cervix but no CMT or adnexal tenderness on bimanual exam.  Musculoskeletal:  Back non tender. No CVA tenderness.   Neurological: She is alert and oriented to person, place, and time.  Skin: Skin is warm and dry. No rash noted.  Psychiatric: She has a normal mood and affect. Her behavior is normal.    Filed Vitals:   04/26/13 1953  BP: 126/78  Pulse: 89  Temp: 99.2 F (37.3 C)  TempSrc: Oral  Resp: 17    Height: 5\' 6"  (1.676 m)  Weight: 296 lb (134.265 kg)  SpO2: 100%   Results for orders placed in visit on 04/26/13  POCT WET PREP WITH KOH      Result Value Range   Trichomonas, UA Negative     Clue Cells Wet Prep HPF POC neg     Epithelial Wet Prep HPF POC 0-5     Yeast Wet Prep HPF POC neg     Bacteria Wet Prep HPF POC 3+     RBC Wet Prep HPF POC neg     WBC Wet Prep HPF POC 0-4     KOH Prep POC Positive    POCT UA - MICROSCOPIC ONLY      Result Value Range   WBC, Ur, HPF, POC  0-1     RBC, urine, microscopic 1-5     Bacteria, U Microscopic trace     Mucus, UA neg     Epithelial cells, urine per micros neg     Crystals, Ur, HPF, POC neg     Casts, Ur, LPF, POC neg     Yeast, UA neg    POCT URINALYSIS DIPSTICK      Result Value Range   Color, UA yellow     Clarity, UA clear     Glucose, UA neg     Bilirubin, UA neg     Ketones, UA neg     Spec Grav, UA 1.010     Blood, UA moderate     pH, UA 6.0     Protein, UA neg     Urobilinogen, UA 0.2     Nitrite, UA neg     Leukocytes, UA Negative         Assessment & Plan:   Tiffany Gallagher is a 48 y.o. female Vaginal discharge- Plan: POCT Wet Prep with KOH, GC/Chlamydia Probe Amp, POCT UA - Microscopic Only, POCT urinalysis dipstick. 2 sexual partners/higher risk sexual behavior - will check gen probe - urine. Declined other STI testing as plans to have this performed at annual exam in next week. Can finish course of flagyl ans unknown if initial d/c of BV.   Candida vaginitis - Plan: fluconazole (DIFLUCAN) 150 MG tablet - repeat now, then again in 1 week if needed. Not on statin.   Hematuria - Plan: CANCELED: Urine culture. Chronic per pt and she was instructed by urology that would always have moderate hematuria after previous workup.  If any new urinary sx's - rtc.   Intermittent abd pain. Mild, longstanding. Hx of GERD.  Discussed labs and workup tonight, but declined at present as mild sx's./  If persistent or any  worsening, recommended eval here, PCP, or GI.    Meds ordered this encounter  Medications  . metroNIDAZOLE (FLAGYL) 500 MG tablet    Sig: Take 500 mg by mouth 3 (three) times daily.  . fluconazole (DIFLUCAN) 150 MG tablet    Sig: Take 1 tablet (150 mg total) by mouth once.    Dispense:  1 tablet    Refill:  0   Patient Instructions  You do have a yeast infection on labs tonight - can take diflucan tonight, and if needed - repeat in 1 week.  You should receive a call or letter about your lab results within the next week to 10 days.  If your abdominal pain increases, or not improving in next week - recommend recheck here or primary care provider.Return to the clinic or go to the nearest emergency room if any of your symptoms worsen or new symptoms occur.  There was a small amount of blood on your urine test, but as this has been moderate in the past and had workup by urologist that told you that it would always be there, no further workup tonight. But if any new urinary symptoms - return for recheck.  Candidal Vulvovaginitis Candidal vulvovaginitis is an infection of the vagina and vulva. The vulva is the skin around the opening of the vagina. This may cause itching and discomfort in and around the vagina.  HOME CARE  Only take medicine as told by your doctor.  Do not have sex (intercourse) until the infection is healed or as told by your doctor.  Practice safe sex.  Tell your sex partner  about your infection.  Do not douche or use tampons.  Wear cotton underwear. Do not wear tight pants or panty hose.  Eat yogurt. This may help treat and prevent yeast infections. GET HELP RIGHT AWAY IF:   You have a fever.  Your problems get worse during treatment or do not get better in 3 days.  You have discomfort, irritation, or itching in your vagina or vulva area.  You have pain after sex.  You start to get belly (abdominal) pain. MAKE SURE YOU:  Understand these  instructions.  Will watch your condition.  Will get help right away if you are not doing well or get worse. Document Released: 07/04/2008 Document Revised: 06/30/2011 Document Reviewed: 07/04/2008 [facility] Patient Information 2014 [place], [place].        I personally performed the services described in this documentation, which was scribed in my presence. The recorded information has been reviewed and considered, and addended by me as needed.

## 2013-04-26 NOTE — Patient Instructions (Addendum)
You do have a yeast infection on labs tonight - can take diflucan tonight, and if needed - repeat in 1 week.  You should receive a call or letter about your lab results within the next week to 10 days.  If your abdominal pain increases, or not improving in next week - recommend recheck here or primary care provider.Return to the clinic or go to the nearest emergency room if any of your symptoms worsen or new symptoms occur.  There was a small amount of blood on your urine test, but as this has been moderate in the past and had workup by urologist that told you that it would always be there, no further workup tonight. But if any new urinary symptoms - return for recheck.  Candidal Vulvovaginitis Candidal vulvovaginitis is an infection of the vagina and vulva. The vulva is the skin around the opening of the vagina. This may cause itching and discomfort in and around the vagina.  HOME CARE  Only take medicine as told by your doctor.  Do not have sex (intercourse) until the infection is healed or as told by your doctor.  Practice safe sex.  Tell your sex partner about your infection.  Do not douche or use tampons.  Wear cotton underwear. Do not wear tight pants or panty hose.  Eat yogurt. This may help treat and prevent yeast infections. GET HELP RIGHT AWAY IF:   You have a fever.  Your problems get worse during treatment or do not get better in 3 days.  You have discomfort, irritation, or itching in your vagina or vulva area.  You have pain after sex.  You start to get belly (abdominal) pain. MAKE SURE YOU:  Understand these instructions.  Will watch your condition.  Will get help right away if you are not doing well or get worse. Document Released: 07/04/2008 Document Revised: 06/30/2011 Document Reviewed: 07/04/2008 [facility] Patient Information 2014 [place], [place].

## 2013-04-28 LAB — GC/CHLAMYDIA PROBE AMP
CT Probe RNA: NEGATIVE
GC Probe RNA: NEGATIVE

## 2013-05-04 ENCOUNTER — Encounter: Payer: Self-pay | Admitting: Women's Health

## 2013-05-04 ENCOUNTER — Ambulatory Visit (INDEPENDENT_AMBULATORY_CARE_PROVIDER_SITE_OTHER): Payer: BC Managed Care – PPO | Admitting: Women's Health

## 2013-05-04 VITALS — BP 134/90 | Ht 65.0 in | Wt 296.0 lb

## 2013-05-04 DIAGNOSIS — L293 Anogenital pruritus, unspecified: Secondary | ICD-10-CM

## 2013-05-04 DIAGNOSIS — Z01419 Encounter for gynecological examination (general) (routine) without abnormal findings: Secondary | ICD-10-CM

## 2013-05-04 DIAGNOSIS — N949 Unspecified condition associated with female genital organs and menstrual cycle: Secondary | ICD-10-CM

## 2013-05-04 DIAGNOSIS — R102 Pelvic and perineal pain: Secondary | ICD-10-CM

## 2013-05-04 DIAGNOSIS — N898 Other specified noninflammatory disorders of vagina: Secondary | ICD-10-CM

## 2013-05-04 LAB — URINALYSIS W MICROSCOPIC + REFLEX CULTURE
Bacteria, UA: NONE SEEN
Bilirubin Urine: NEGATIVE
Casts: NONE SEEN
Crystals: NONE SEEN
Glucose, UA: NEGATIVE mg/dL
KETONES UR: NEGATIVE mg/dL
Leukocytes, UA: NEGATIVE
Nitrite: NEGATIVE
Protein, ur: NEGATIVE mg/dL
Specific Gravity, Urine: 1.02 (ref 1.005–1.030)
Urobilinogen, UA: 0.2 mg/dL (ref 0.0–1.0)
WBC UA: NONE SEEN WBC/hpf (ref <3)
pH: 7 (ref 5.0–8.0)

## 2013-05-04 LAB — WET PREP FOR TRICH, YEAST, CLUE
Clue Cells Wet Prep HPF POC: NONE SEEN
Trich, Wet Prep: NONE SEEN
WBC, Wet Prep HPF POC: NONE SEEN
YEAST WET PREP: NONE SEEN

## 2013-05-04 MED ORDER — TERCONAZOLE 0.4 % VA CREA: 1.0000 | TOPICAL_CREAM | Freq: Every day | VAGINAL | Status: DC

## 2013-05-04 NOTE — Progress Notes (Signed)
Tiffany Gallagher [birth year] [id number]    History:    The patient presents for annual exam.  TVH for fibroids. Smokes half pack daily. Normal Pap and mammogram history. Hypertension primary care manages labs and meds. Negative STD screen 04/2013 had bacterial vaginosis and yeast.  Past medical history, past surgical history, family history and social history were all reviewed and documented in the EPIC chart. Works at [employer]. [name] 19, doing well.   ROS:  A  ROS was performed and pertinent positives and negatives are included in the history.  Exam:  Filed Vitals:   05/04/13 1534  BP: 134/90    General appearance:  Normal Head/Neck:  Normal, without cervical or supraclavicular adenopathy. Thyroid:  Symmetrical, normal in size, without palpable masses or nodularity. Respiratory  Effort:  Normal  Auscultation:  Clear without wheezing or rhonchi Cardiovascular  Auscultation:  Regular rate, without rubs, murmurs or gallops  Edema/varicosities:  Not grossly evident Abdominal  Soft,nontender, without masses, guarding or rebound.  Liver/spleen:  No organomegaly noted  Hernia:  None appreciated  Skin  Inspection:  Grossly normal  Palpation:  Grossly normal Neurologic/psychiatric  Orientation:  Normal with appropriate conversation.  Mood/affect:  Normal  Genitourinary    Breasts: Examined lying and sitting.     Right: Without masses, retractions, discharge or axillary adenopathy.     Left: Without masses, retractions, discharge or axillary adenopathy.   Inguinal/mons:  Normal without inguinal adenopathy  External genitalia:  Normal  BUS/Urethra/Skene's glands:  Normal  Bladder:  Normal  Vagina:  Normal, wet prep negative  Cervix: Absent  Uterus: Absent  Adnexa/parametria:     Rt: Without masses or tenderness.   Lt: Without masses or tenderness.  Anus and perineum: Normal  Digital rectal exam: Normal sphincter tone without palpated masses or tenderness  Assessment/Plan:   48 y.o. SBF G1P1 for annual exam with complaint of external vaginal itching.    TVH/fibroids Hypertension-primary care manages labs and meds Smoker Morbid obesity History of benign hematuria/ evaluated by urologist.  Plan: SBE's, continue annual mammogram, calcium rich diet, vitamin D 2000 daily encouraged. Aware of hazards of smoking is cutting down and has joined a fitness group for exercise and diet at work. Condoms encouraged. Terazol 7 vaginal cream apply externally when necessary for itching. Instructed to call if no relief of symptoms. UA moderate blood, 7-10 WBCs urine culture pending.    [place], 4:25 PM 05/04/2013

## 2013-05-04 NOTE — Patient Instructions (Signed)

## 2013-05-05 ENCOUNTER — Encounter: Payer: Self-pay | Admitting: Women's Health

## 2013-05-05 LAB — URINE CULTURE
COLONY COUNT: NO GROWTH
Organism ID, Bacteria: NO GROWTH

## 2013-06-15 ENCOUNTER — Ambulatory Visit (INDEPENDENT_AMBULATORY_CARE_PROVIDER_SITE_OTHER): Payer: BC Managed Care – PPO | Admitting: Women's Health

## 2013-06-15 ENCOUNTER — Encounter: Payer: Self-pay | Admitting: Women's Health

## 2013-06-15 DIAGNOSIS — N898 Other specified noninflammatory disorders of vagina: Secondary | ICD-10-CM

## 2013-06-15 DIAGNOSIS — Z113 Encounter for screening for infections with a predominantly sexual mode of transmission: Secondary | ICD-10-CM

## 2013-06-15 LAB — HEPATITIS B SURFACE ANTIGEN: Hepatitis B Surface Ag: NEGATIVE

## 2013-06-15 LAB — WET PREP FOR TRICH, YEAST, CLUE
Clue Cells Wet Prep HPF POC: NONE SEEN
TRICH WET PREP: NONE SEEN
Yeast Wet Prep HPF POC: NONE SEEN

## 2013-06-15 LAB — RPR

## 2013-06-15 LAB — HEPATITIS C ANTIBODY: HCV AB: NEGATIVE

## 2013-06-15 LAB — HIV ANTIBODY (ROUTINE TESTING W REFLEX): HIV: NONREACTIVE

## 2013-06-15 MED ORDER — NYSTATIN-TRIAMCINOLONE 100000-0.1 UNIT/GM-% EX OINT: 1.0000 "application " | TOPICAL_OINTMENT | Freq: Two times a day (BID) | CUTANEOUS | Status: DC

## 2013-06-15 NOTE — Progress Notes (Signed)
Patient ID: Tiffany Gallagher, female   DOB: [birth year], 48 y.o.   MRN: [id number] Presents with complaint of vaginal itching and questionable bumps near  rectum. States has been tried to exercise more and has noticed irritation in the vaginal and rectal area. Unprotected intercourse x1. States is worried that it may be herpes. TVH. Had been treated for external itching with Terazol in January. Hypertension and morbid obesity. Denies abdominal pain, fever and and you and a or urinary symptoms.  Exam: Appears well but worried. External genitalia right labia 1 cm folliculitis, no visible lesions, slight irritation at rectum, no visible blisters. Speculum exam, scant white discharge with no odor or erythema noted. Wet prep negative. Bimanual no adnexal tenderness or fullness noted.  Vaginal/perineal irritation STD screen Morbid obesity  Plan: HSV 1 and 2 blood, HIV, hep B, C., RPR. Reviewed importance of condoms. Mycolog ointment to irritation, small amount twice daily, instructed to call if no relief. Loose clothes, change after exercise but to continue exercise and decrease calories for weight loss.

## 2013-06-15 NOTE — Patient Instructions (Signed)
Herpes Simplex Herpes simplex is generally classified as Type 1 or Type 2. Type 1 is generally the type that is responsible for cold sores. Type 2 is generally associated with sexually transmitted diseases. We now know that most of the thoughts on these viruses are inaccurate. We find that HSV1 is also present genitally and HSV2 can be present orally, but this will vary in different locations of the world. Herpes simplex is usually detected by doing a culture. Blood tests are also available for this virus; however, the accuracy is often not as good.  PREPARATION FOR TEST No preparation or fasting is necessary. NORMAL FINDINGS  No virus present  No HSV antigens or antibodies present Ranges for normal findings may vary among different laboratories and hospitals. You should always check with your doctor after having lab work or other tests done to discuss the meaning of your test results and whether your values are considered within normal limits. MEANING OF TEST  Your caregiver will go over the test results with you and discuss the importance and meaning of your results, as well as treatment options and the need for additional tests if necessary. OBTAINING THE TEST RESULTS  It is your responsibility to obtain your test results. Ask the lab or department performing the test when and how you will get your results. Document Released: 05/10/2004 Document Revised: 06/30/2011 Document Reviewed: 03/18/2008 [employer] Patient Information 2014 [employer].  

## 2013-06-17 LAB — HSV(HERPES SIMPLEX VRS) I + II AB-IGG
HSV 1 GLYCOPROTEIN G AB, IGG: 9.66 IV — AB
HSV 2 Glycoprotein G Ab, IgG: 0.13 IV

## 2013-06-20 ENCOUNTER — Telehealth: Payer: Self-pay

## 2013-06-20 ENCOUNTER — Encounter: Payer: Self-pay | Admitting: Women's Health

## 2013-06-20 DIAGNOSIS — B009 Herpesviral infection, unspecified: Secondary | ICD-10-CM | POA: Insufficient documentation

## 2013-06-20 NOTE — Telephone Encounter (Signed)
TC  Reviewed HSV, questions answered.

## 2013-06-20 NOTE — Telephone Encounter (Signed)
Patient had questions I could not answer. She is still experiencing vulvar/rectal discomfort. She asked if she could use the Mycolog cream on the vulvar area and I told her that was fine.  The other questions were about the diagnosis, need for medication, etc. I told her I would have you call because she was assuming this HSV 1 was diagnosis for what is going on now.  (She has never had a fever blister.)  Some confusion on both our parts that I am sure you can clear up for her.

## 2013-06-20 NOTE — Telephone Encounter (Signed)
Message copied by Ramond Craver on Mon Jun 20, 2013 11:22 AM ------      Message from: [place], [place] J      Created: Mon Jun 20, 2013  7:27 AM       Please call and inform positive for HSV 1, type most commonly on lips/fever blisters, and neg for type 2 most commonly found genitally. The rest of the std screen was negative. If she would need medication for fever blisters to let us know, their is medication for that. ------

## 2013-07-30 ENCOUNTER — Ambulatory Visit: Payer: BC Managed Care – PPO

## 2013-07-30 ENCOUNTER — Ambulatory Visit: Payer: BC Managed Care – PPO | Admitting: Family Medicine

## 2013-07-30 VITALS — BP 140/78 | HR 63 | Temp 98.7°F | Ht 64.0 in | Wt 297.0 lb

## 2013-07-30 DIAGNOSIS — M25511 Pain in right shoulder: Secondary | ICD-10-CM

## 2013-07-30 DIAGNOSIS — M25519 Pain in unspecified shoulder: Secondary | ICD-10-CM

## 2013-07-30 DIAGNOSIS — IMO0002 Reserved for concepts with insufficient information to code with codable children: Secondary | ICD-10-CM

## 2013-07-30 DIAGNOSIS — S46911A Strain of unspecified muscle, fascia and tendon at shoulder and upper arm level, right arm, initial encounter: Secondary | ICD-10-CM

## 2013-07-30 MED ORDER — TRAMADOL HCL 50 MG PO TABS: ORAL_TABLET | ORAL | Status: DC

## 2013-07-30 MED ORDER — MELOXICAM 15 MG PO TABS: 15.0000 mg | ORAL_TABLET | Freq: Every day | ORAL | Status: DC

## 2013-07-30 NOTE — Progress Notes (Signed)
[facility] [address], [place] [address] [phone]  Date:  07/30/2013   Name:  Tiffany Gallagher   DOB:  1966/01/28   MRN:  [id number]  PCP:  Garnet Koyanagi, DO    Chief Complaint: Shoulder Pain   History of Present Illness:  Tiffany Gallagher is a 48 y.o. very pleasant female patient who presents with the following:  She is here today with a problem with her right shoulder.  She noted this Thursday- today is Saturday. She has tried some ibuprofen which helped a little bit.  Last night she tried a muscle relaxer- this did help her to sleep a little bit.   NKI- she just noted onset of pain. She is not aware of any new activities that could injure her- she has not been working over head.  Never had this in the past.  No falls.  otherwise she feels well  Patient Active Problem List   Diagnosis Date Noted  . 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 Obesity (BMI 30-39.9) 01/18/2013  . Migraines   . Hypertension   . Smoker   . Sinusitis 04/11/2011  . Leg cramps 09/18/2010  . Edema 09/18/2010  . VAGINAL DISCHARGE 09/20/2009  . KNEE PAIN, BILATERAL 09/20/2009  . FLATULENCE ERUCTATION AND GAS PAIN 08/03/2009  . HEMATURIA, HX OF 07/04/2009  . HYPERTENSION 02/07/2008  . OBESITY NOS 11/30/2006  . GERD 11/30/2006    Past Medical History  Diagnosis Date  . GERD (gastroesophageal reflux disease)   . Hyperlipidemia   . Migraines   . Hypertension   . Smoker     HALF PPD  . Fibroid   . Anemia     Past Surgical History  Procedure Laterality Date  . Cesarean section  1995  . Vaginal hysterectomy  1998    partial    History  Substance Use Topics  . Smoking status: Current Every Day Smoker -- 0.50 packs/day for 30 years    Types: Cigarettes  . Smokeless tobacco: Never Used     Comment: cutting back  . Alcohol Use: 0.0 oz/week     Comment: rarely    Family History  Problem Relation Age of Onset  . Coronary artery disease Mother   . Hypertension Mother   .  Kidney disease Mother 47    dialysis  . Aneurysm Mother     brain  . Stroke Mother   . Heart disease Mother     MI  . Cancer Father 59    brain cancer  . Diabetes Sister   . Arthritis Brother   . Hypertension Brother   . Diabetes Brother   . Hypertension Brother     Allergies  Allergen Reactions  . Lipitor [Atorvastatin Calcium]     Nerves, muscle spasms, light stool    Medication list has been reviewed and updated.  Current Outpatient Prescriptions on File Prior to Visit  Medication Sig Dispense Refill  . baclofen (LIORESAL) 10 MG tablet Take 1 tablet (10 mg total) by mouth at bedtime as needed (muscle spasms).  30 each  1  . benazepril (LOTENSIN) 10 MG tablet Take 1 tablet (10 mg total) by mouth daily.  90 tablet  1  . hydrochlorothiazide (HYDRODIURIL) 25 MG tablet Take 1 tablet (25 mg total) by mouth daily.  90 tablet  3  . meloxicam (MOBIC) 15 MG tablet Take 1 tablet (15 mg total) by mouth daily.  30 tablet  0  . Multiple Vitamin (MULTIVITAMIN WITH MINERALS)  TABS Take 1 tablet by mouth daily.      Marland Kitchen nystatin-triamcinolone ointment (MYCOLOG) Apply 1 application topically 2 (two) times daily.  30 g  1  . omeprazole (PRILOSEC) 20 MG capsule Take 1 capsule (20 mg total) by mouth daily.  90 capsule  3  . simethicone (MYLICON) 80 MG chewable tablet Chew 80 mg by mouth 3 (three) times daily as needed for flatulence.        No current facility-administered medications on file prior to visit.    Review of Systems:  As per HPI- otherwise negative.   Physical Examination: Filed Vitals:   07/30/13 0902  BP: 140/78  Pulse: 63  Temp: 98.7 F (37.1 C)   Filed Vitals:   07/30/13 0902  Height: 5\' 4"  (1.626 m)  Weight: 297 lb (134.718 kg)   Body mass index is 50.95 kg/(m^2). Ideal Body Weight: Weight in (lb) to have BMI = 25: 145.3  GEN: WDWN, NAD, Non-toxic, A & O x 3, morbid obesity, looks well HEENT: Atraumatic, Normocephalic. Neck supple. No masses, No LAD. Ears and  Nose: No external deformity. CV: RRR, No M/G/R. No JVD. No thrill. No extra heart sounds. PULM: CTA B, no wheezes, crackles, rhonchi. No retractions. No resp. distress. No accessory muscle use. EXTR: No c/c/e NEURO Normal gait.  PSYCH: Normally interactive. Conversant. Not depressed or anxious appearing.  Calm demeanor.  Right shoulder: she is very tender over the anterior and posterior shoulder.  Limited ROM due to pain.  No dislocation.  Normal strength and sensation of both arms, normal biceps DTR bilaterally.  Able to abduct to about 90 degrees and flex to 120 degrees. Pain with internal and external rotation as well  [facility] reading (PRIMARY) by  Dr. Lorelei Pont. Right shoulder; no fracture or dislocation.  Small ?tendon calcification adjacent to prox humerus.    RIGHT SHOULDER - 2+ VIEW  COMPARISON: None.  FINDINGS: Visualized ribs appear normal. No fracture or dislocation is noted. Calcification is seen in the region the greater tuberosity of the proximal right humerus consistent with calcific tendinitis.  IMPRESSION: Probable calcific tendonitis. No fracture or dislocation is noted.  Assessment and Plan: Right shoulder pain - Plan: DG Shoulder Right, traMADol (ULTRAM) 50 MG tablet, meloxicam (MOBIC) 15 MG tablet  Right shoulder strain  Placed in sling for comfort and went over ROM to keep her shoulder movement.  Tramadol as needed for more severe pain, mobic once a day.  If not better soon will refer to ortho  See patient instructions for more details.    Signed Lamar Blinks, MD

## 2013-07-30 NOTE — Patient Instructions (Signed)
You have a shoulder strain/ tendonitis. Use ice and the sling as needed but remember to maintain your range of motion.  You can use the mobic once a day as needed for pain and inflammation Use the tramadol as needed for more severe pain but remember this can cause you to feel sleepy.   Let me know if you are not feeling better soon- call or send a mychart message and I will refer you to orthopedics if needed.

## 2013-08-25 ENCOUNTER — Telehealth: Payer: Self-pay | Admitting: Family Medicine

## 2013-08-25 NOTE — Telephone Encounter (Signed)
Caller name: Kyeshia Relation to pt: Call back number: [phone] Pharmacy: [facility] [address] [place], [place] - [address]  Reason for call:   Pt states that the had a hand written script for RX Diethylpropion HCl CR 75 MG TB24 [[id number]] DISCONTINUED, but lost it.  They recently found the script and tried to get it filled, but the pharmacy said it had expired.  Pt still has the paper script and wants to know if they can get it re-written.   Please call pt to advise

## 2013-08-25 NOTE — Telephone Encounter (Signed)
Pt states that since she is a self pay, she doesn't want to come in for an office visit and have to pay out of pocket just to have the script re-written.  Pt ended call.

## 2013-08-25 NOTE — Telephone Encounter (Signed)
Needs ov

## 2013-08-25 NOTE — Telephone Encounter (Signed)
Rx is no longer on the medication list and the patient has not been seen here since 01/18/13. Please advise if refill appropriate.     KP

## 2014-01-16 ENCOUNTER — Other Ambulatory Visit: Payer: Self-pay

## 2014-01-16 DIAGNOSIS — Z1231 Encounter for screening mammogram for malignant neoplasm of breast: Secondary | ICD-10-CM

## 2014-01-20 ENCOUNTER — Ambulatory Visit (INDEPENDENT_AMBULATORY_CARE_PROVIDER_SITE_OTHER): Payer: BC Managed Care – PPO | Admitting: Medical

## 2014-01-20 ENCOUNTER — Encounter: Payer: Self-pay | Admitting: Medical

## 2014-01-20 VITALS — BP 118/79 | HR 79 | Temp 97.9°F | Ht 66.5 in | Wt 300.2 lb

## 2014-01-20 DIAGNOSIS — Z0000 Encounter for general adult medical examination without abnormal findings: Secondary | ICD-10-CM

## 2014-01-20 LAB — COMPREHENSIVE METABOLIC PANEL WITH GFR
ALT: 36 U/L — ABNORMAL HIGH (ref 0–35)
AST: 31 U/L (ref 0–37)
Albumin: 3.7 g/dL (ref 3.5–5.2)
Alkaline Phosphatase: 48 U/L (ref 39–117)
BUN: 10 mg/dL (ref 6–23)
CO2: 25 meq/L (ref 19–32)
Calcium: 9.1 mg/dL (ref 8.4–10.5)
Chloride: 104 meq/L (ref 96–112)
Creatinine, Ser: 0.5 mg/dL (ref 0.4–1.2)
GFR: 154.89 mL/min (ref >60.00)
Glucose, Bld: 98 mg/dL (ref 70–99)
Potassium: 3.7 meq/L (ref 3.5–5.1)
Sodium: 135 meq/L (ref 135–145)
Total Bilirubin: 0.6 mg/dL (ref 0.2–1.2)
Total Protein: 7.6 g/dL (ref 6.0–8.3)

## 2014-01-20 LAB — CBC WITH DIFFERENTIAL/PLATELET
BASOS ABS: 0 10*3/uL (ref 0.0–0.1)
Basophils Relative: 0.5 % (ref 0.0–3.0)
EOS ABS: 0.2 10*3/uL (ref 0.0–0.7)
EOS PCT: 2.2 % (ref 0.0–5.0)
HEMATOCRIT: 37.2 % (ref 36.0–46.0)
Hemoglobin: 12.5 g/dL (ref 12.0–15.0)
LYMPHS ABS: 3 10*3/uL (ref 0.7–4.0)
Lymphocytes Relative: 39.3 % (ref 12.0–46.0)
MCHC: 33.5 g/dL (ref 30.0–36.0)
MCV: 93.6 fl (ref 78.0–100.0)
MONO ABS: 0.5 10*3/uL (ref 0.1–1.0)
Monocytes Relative: 6.9 % (ref 3.0–12.0)
NEUTROS PCT: 51.1 % (ref 43.0–77.0)
Neutro Abs: 3.9 10*3/uL (ref 1.4–7.7)
Platelets: 309 10*3/uL (ref 150.0–400.0)
RBC: 3.98 Mil/uL (ref 3.87–5.11)
RDW: 14.7 % (ref 11.5–15.5)
WBC: 7.6 10*3/uL (ref 4.0–10.5)

## 2014-01-20 LAB — POCT URINALYSIS DIPSTICK
Bilirubin, UA: NEGATIVE
Glucose, UA: NEGATIVE
KETONES UA: NEGATIVE
Leukocytes, UA: NEGATIVE
Nitrite, UA: NEGATIVE
PH UA: 7.5
Urobilinogen, UA: 0.2

## 2014-01-20 LAB — LIPID PANEL
Cholesterol: 205 mg/dL — ABNORMAL HIGH (ref 0–200)
HDL: 37.2 mg/dL — ABNORMAL LOW (ref >39.00)
LDL Cholesterol: 142 mg/dL — ABNORMAL HIGH (ref 0–99)
NonHDL: 167.8
Total CHOL/HDL Ratio: 6
Triglycerides: 128 mg/dL (ref 0.0–149.0)
VLDL: 25.6 mg/dL (ref 0.0–40.0)

## 2014-01-20 LAB — TSH: TSH: 1.19 u[IU]/mL (ref 0.35–4.50)

## 2014-01-20 NOTE — Progress Notes (Signed)
Subjective:    Patient ID: Tiffany Gallagher, female    DOB: [birth year], 48 y.o.   MRN: [id number]  HPI  Here of cpe. No acute severe problems. Pt has not seen gynocelogist. She sees her every year. She will do Papsmear and mammogram with gyn.   Pt employed [employer]. Pt tries to eat healthy. She does enjoy ice cream.  Not exercising. Enjoys watching movies. Does attend church. Pt smokes about 1/2 pack day. Wine occasional max 1-2 glasses.  Pt does not want flu vaccines.   Past Medical History  Diagnosis Date  . GERD (gastroesophageal reflux disease)   . Hyperlipidemia   . Migraines   . Hypertension   . Smoker     HALF PPD  . Fibroid   . Anemia     History   Social History  . Marital Status: Single    Spouse Name: N/A    Number of Children: 1  . Years of Education: 17   Occupational History  . customer service     Social History Main Topics  . Smoking status: Current Every Day Smoker -- 0.50 packs/day for 30 years    Types: Cigarettes  . Smokeless tobacco: Never Used     Comment: cutting back  . Alcohol Use: 0.0 oz/week     Comment: rarely  . Drug Use: No  . Sexual Activity: Yes    Partners: Male    Birth Control/ Protection: Surgical, Condom   Other Topics Concern  . Not on file   Social History Narrative   Exercise-- no    Past Surgical History  Procedure Laterality Date  . Cesarean section  1995  . Vaginal hysterectomy  1998    partial    Family History  Problem Relation Age of Onset  . Coronary artery disease Mother   . Hypertension Mother   . Kidney disease Mother 33    dialysis  . Aneurysm Mother     brain  . Stroke Mother   . Heart disease Mother     MI  . Cancer Father 7    brain cancer  . Diabetes Sister   . Arthritis Brother   . Hypertension Brother   . Diabetes Brother   . Hypertension Brother     Allergies  Allergen Reactions  . Lipitor [Atorvastatin Calcium]     Nerves, muscle spasms, light stool    Current  Outpatient Prescriptions on File Prior to Visit  Medication Sig Dispense Refill  . baclofen (LIORESAL) 10 MG tablet Take 1 tablet (10 mg total) by mouth at bedtime as needed (muscle spasms).  30 each  1  . benazepril (LOTENSIN) 10 MG tablet Take 1 tablet (10 mg total) by mouth daily.  90 tablet  1  . hydrochlorothiazide (HYDRODIURIL) 25 MG tablet Take 1 tablet (25 mg total) by mouth daily.  90 tablet  3  . meloxicam (MOBIC) 15 MG tablet Take 1 tablet (15 mg total) by mouth daily.  30 tablet  0  . Multiple Vitamin (MULTIVITAMIN WITH MINERALS) TABS Take 1 tablet by mouth daily.      Marland Kitchen nystatin-triamcinolone ointment (MYCOLOG) Apply 1 application topically 2 (two) times daily.  30 g  1  . omeprazole (PRILOSEC) 20 MG capsule Take 1 capsule (20 mg total) by mouth daily.  90 capsule  3  . simethicone (MYLICON) 80 MG chewable tablet Chew 80 mg by mouth 3 (three) times daily as needed for flatulence.       Marland Kitchen  traMADol (ULTRAM) 50 MG tablet Take 1 or 2 every 8 hours as needed for pain  40 tablet  0   No current facility-administered medications on file prior to visit.    BP 118/79  Pulse 79  Temp(Src) 97.9 F (36.6 C) (Oral)  Ht 5' 6.5" (1.689 m)  Wt 300 lb 3.2 oz (136.17 kg)  BMI 47.73 kg/m2  SpO2 98%      Review of Systems  Constitutional: Negative for chills, fatigue and unexpected weight change.  HENT: Positive for congestion. Negative for ear pain, facial swelling, hearing loss, mouth sores, nosebleeds, postnasal drip, rhinorrhea, sneezing, sore throat and trouble swallowing.   Respiratory: Positive for cough.        Mild cough and little nasal congestion. Taking robitussin otc and dong better.  Cardiovascular: Negative for chest pain and palpitations.  Gastrointestinal: Negative.   Genitourinary: Negative.   Musculoskeletal: Negative.        Occasional she will get small cramps in different muscles. No constant or severe but random.  Hematological: Negative for adenopathy. Does not  bruise/bleed easily.  Psychiatric/Behavioral: Negative.        Objective:   Physical Exam  Constitutional: She is oriented to person, place, and time. She appears well-developed and well-nourished. No distress.  Pleasant pt. She is obese.  HENT:  Head: Normocephalic and atraumatic.  Right Ear: External ear normal.  Left Ear: External ear normal.  Nose: Nose normal.  Mouth/Throat: Oropharynx is clear and moist. No oropharyngeal exudate.  Nasal congested.  Eyes: Conjunctivae and EOM are normal. Pupils are equal, round, and reactive to light.  Neck: Normal range of motion. Neck supple. No JVD present. No tracheal deviation present. No thyromegaly present.  Cardiovascular: Normal rate, regular rhythm and normal heart sounds.  Exam reveals no gallop and no friction rub.   No murmur heard. Pulmonary/Chest: Effort normal and breath sounds normal. No stridor. No respiratory distress. She has no wheezes. She has no rales. She exhibits no tenderness.  Abdominal: Soft. Bowel sounds are normal. She exhibits no distension and no mass. There is no tenderness. There is no rebound and no guarding.  Musculoskeletal:  No cva tenderness.  Rt foot- normal appearance. Faint minimal pain on palpation of rt great toe joint. No redness, no warmth or swelling.  Lymphadenopathy:    She has no cervical adenopathy.  Neurological: She is alert and oriented to person, place, and time. No cranial nerve deficit. Coordination normal.  Skin: Skin is warm and dry. She is not diaphoretic.  Psychiatric: She has a normal mood and affect. Her behavior is normal. Judgment and thought content normal.            Assessment & Plan:  Note no pressing issues today but did document her mid congestion, foot pain, and weight concerns. Can address on future visit or after we contact her on labs if areas worsen. Note also counseled on smoking cessation today.

## 2014-01-20 NOTE — Patient Instructions (Addendum)
Preventive Care for Adults A healthy lifestyle and preventive care can promote health and wellness. Preventive health guidelines for women include the following key practices.  A routine yearly physical is a good way to check with your health care provider about your health and preventive screening. It is a chance to share any concerns and updates on your health and to receive a thorough exam.  Visit your dentist for a routine exam and preventive care every 6 months. Brush your teeth twice a day and floss once a day. Good oral hygiene prevents tooth decay and gum disease.  The frequency of eye exams is based on your age, health, family medical history, use of contact lenses, and other factors. Follow your health care provider's recommendations for frequency of eye exams.  Eat a healthy diet. Foods like vegetables, fruits, whole grains, low-fat dairy products, and lean protein foods contain the nutrients you need without too many calories. Decrease your intake of foods high in solid fats, added sugars, and salt. Eat the right amount of calories for you.Get information about a proper diet from your health care provider, if necessary.  Regular physical exercise is one of the most important things you can do for your health. Most adults should get at least 150 minutes of moderate-intensity exercise (any activity that increases your heart rate and causes you to sweat) each week. In addition, most adults need muscle-strengthening exercises on 2 or more days a week.  Maintain a healthy weight. The body mass index (BMI) is a screening tool to identify possible weight problems. It provides an estimate of body fat based on height and weight. Your health care provider can find your BMI and can help you achieve or maintain a healthy weight.For adults 20 years and older:  A BMI below 18.5 is considered underweight.  A BMI of 18.5 to 24.9 is normal.  A BMI of 25 to 29.9 is considered overweight.  A BMI of  30 and above is considered obese.  Maintain normal blood lipids and cholesterol levels by exercising and minimizing your intake of saturated fat. Eat a balanced diet with plenty of fruit and vegetables. Blood tests for lipids and cholesterol should begin at age 76 and be repeated every 5 years. If your lipid or cholesterol levels are high, you are over 50, or you are at high risk for heart disease, you may need your cholesterol levels checked more frequently.Ongoing high lipid and cholesterol levels should be treated with medicines if diet and exercise are not working.  If you smoke, find out from your health care provider how to quit. If you do not use tobacco, do not start.  Lung cancer screening is recommended for adults aged 22-80 years who are at high risk for developing lung cancer because of a history of smoking. A yearly low-dose CT scan of the lungs is recommended for people who have at least a 30-pack-year history of smoking and are a current smoker or have quit within the past 15 years. A pack year of smoking is smoking an average of 1 pack of cigarettes a day for 1 year (for example: 1 pack a day for 30 years or 2 packs a day for 15 years). Yearly screening should continue until the smoker has stopped smoking for at least 15 years. Yearly screening should be stopped for people who develop a health problem that would prevent them from having lung cancer treatment.  If you are pregnant, do not drink alcohol. If you are breastfeeding,  be very cautious about drinking alcohol. If you are not pregnant and choose to drink alcohol, do not have more than 1 drink per day. One drink is considered to be 12 ounces (355 mL) of beer, 5 ounces (148 mL) of wine, or 1.5 ounces (44 mL) of liquor.  Avoid use of street drugs. Do not share needles with anyone. Ask for help if you need support or instructions about stopping the use of drugs.  High blood pressure causes heart disease and increases the risk of  stroke. Your blood pressure should be checked at least every 1 to 2 years. Ongoing high blood pressure should be treated with medicines if weight loss and exercise do not work.  If you are 75-52 years old, ask your health care provider if you should take aspirin to prevent strokes.  Diabetes screening involves taking a blood sample to check your fasting blood sugar level. This should be done once every 3 years, after age 15, if you are within normal weight and without risk factors for diabetes. Testing should be considered at a younger age or be carried out more frequently if you are overweight and have at least 1 risk factor for diabetes.  Breast cancer screening is essential preventive care for women. You should practice "breast self-awareness." This means understanding the normal appearance and feel of your breasts and may include breast self-examination. Any changes detected, no matter how small, should be reported to a health care provider. Women in their 58s and 30s should have a clinical breast exam (CBE) by a health care provider as part of a regular health exam every 1 to 3 years. After age 16, women should have a CBE every year. Starting at age 53, women should consider having a mammogram (breast X-ray test) every year. Women who have a family history of breast cancer should talk to their health care provider about genetic screening. Women at a high risk of breast cancer should talk to their health care providers about having an MRI and a mammogram every year.  Breast cancer gene (BRCA)-related cancer risk assessment is recommended for women who have family members with BRCA-related cancers. BRCA-related cancers include breast, ovarian, tubal, and peritoneal cancers. Having family members with these cancers may be associated with an increased risk for harmful changes (mutations) in the breast cancer genes BRCA1 and BRCA2. Results of the assessment will determine the need for genetic counseling and  BRCA1 and BRCA2 testing.  Routine pelvic exams to screen for cancer are no longer recommended for nonpregnant women who are considered low risk for cancer of the pelvic organs (ovaries, uterus, and vagina) and who do not have symptoms. Ask your health care provider if a screening pelvic exam is right for you.  If you have had past treatment for cervical cancer or a condition that could lead to cancer, you need Pap tests and screening for cancer for at least 20 years after your treatment. If Pap tests have been discontinued, your risk factors (such as having a new sexual partner) need to be reassessed to determine if screening should be resumed. Some women have medical problems that increase the chance of getting cervical cancer. In these cases, your health care provider may recommend more frequent screening and Pap tests.  The HPV test is an additional test that may be used for cervical cancer screening. The HPV test looks for the virus that can cause the cell changes on the cervix. The cells collected during the Pap test can be  tested for HPV. The HPV test could be used to screen women aged 30 years and older, and should be used in women of any age who have unclear Pap test results. After the age of 30, women should have HPV testing at the same frequency as a Pap test.  Colorectal cancer can be detected and often prevented. Most routine colorectal cancer screening begins at the age of 50 years and continues through age 75 years. However, your health care provider may recommend screening at an earlier age if you have risk factors for colon cancer. On a yearly basis, your health care provider may provide home test kits to check for hidden blood in the stool. Use of a small camera at the end of a tube, to directly examine the colon (sigmoidoscopy or colonoscopy), can detect the earliest forms of colorectal cancer. Talk to your health care provider about this at age 50, when routine screening begins. Direct  exam of the colon should be repeated every 5-10 years through age 75 years, unless early forms of pre-cancerous polyps or small growths are found.  People who are at an increased risk for hepatitis B should be screened for this virus. You are considered at high risk for hepatitis B if:  You were born in a country where hepatitis B occurs often. Talk with your health care provider about which countries are considered high risk.  Your parents were born in a high-risk country and you have not received a shot to protect against hepatitis B (hepatitis B vaccine).  You have HIV or AIDS.  You use needles to inject street drugs.  You live with, or have sex with, someone who has hepatitis B.  You get hemodialysis treatment.  You take certain medicines for conditions like cancer, organ transplantation, and autoimmune conditions.  Hepatitis C blood testing is recommended for all people born from 1945 through 1965 and any individual with known risks for hepatitis C.  Practice safe sex. Use condoms and avoid high-risk sexual practices to reduce the spread of sexually transmitted infections (STIs). STIs include gonorrhea, chlamydia, syphilis, trichomonas, herpes, HPV, and human immunodeficiency virus (HIV). Herpes, HIV, and HPV are viral illnesses that have no cure. They can result in disability, cancer, and death.  You should be screened for sexually transmitted illnesses (STIs) including gonorrhea and chlamydia if:  You are sexually active and are younger than 24 years.  You are older than 24 years and your health care provider tells you that you are at risk for this type of infection.  Your sexual activity has changed since you were last screened and you are at an increased risk for chlamydia or gonorrhea. Ask your health care provider if you are at risk.  If you are at risk of being infected with HIV, it is recommended that you take a prescription medicine daily to prevent HIV infection. This is  called preexposure prophylaxis (PrEP). You are considered at risk if:  You are a heterosexual woman, are sexually active, and are at increased risk for HIV infection.  You take drugs by injection.  You are sexually active with a partner who has HIV.  Talk with your health care provider about whether you are at high risk of being infected with HIV. If you choose to begin PrEP, you should first be tested for HIV. You should then be tested every 3 months for as long as you are taking PrEP.  Osteoporosis is a disease in which the bones lose minerals and strength   with aging. This can result in serious bone fractures or breaks. The risk of osteoporosis can be identified using a bone density scan. Women ages 65 years and over and women at risk for fractures or osteoporosis should discuss screening with their health care providers. Ask your health care provider whether you should take a calcium supplement or vitamin D to reduce the rate of osteoporosis.  Menopause can be associated with physical symptoms and risks. Hormone replacement therapy is available to decrease symptoms and risks. You should talk to your health care provider about whether hormone replacement therapy is right for you.  Use sunscreen. Apply sunscreen liberally and repeatedly throughout the day. You should seek shade when your shadow is shorter than you. Protect yourself by wearing long sleeves, pants, a wide-brimmed hat, and sunglasses year round, whenever you are outdoors.  Once a month, do a whole body skin exam, using a mirror to look at the skin on your back. Tell your health care provider of new moles, moles that have irregular borders, moles that are larger than a pencil eraser, or moles that have changed in shape or color.  Stay current with required vaccines (immunizations).  Influenza vaccine. All adults should be immunized every year.  Tetanus, diphtheria, and acellular pertussis (Td, Tdap) vaccine. Pregnant women should  receive 1 dose of Tdap vaccine during each pregnancy. The dose should be obtained regardless of the length of time since the last dose. Immunization is preferred during the 27th-36th week of gestation. An adult who has not previously received Tdap or who does not know her vaccine status should receive 1 dose of Tdap. This initial dose should be followed by tetanus and diphtheria toxoids (Td) booster doses every 10 years. Adults with an unknown or incomplete history of completing a 3-dose immunization series with Td-containing vaccines should begin or complete a primary immunization series including a Tdap dose. Adults should receive a Td booster every 10 years.  Varicella vaccine. An adult without evidence of immunity to varicella should receive 2 doses or a second dose if she has previously received 1 dose. Pregnant females who do not have evidence of immunity should receive the first dose after pregnancy. This first dose should be obtained before leaving the health care facility. The second dose should be obtained 4-8 weeks after the first dose.  Human papillomavirus (HPV) vaccine. Females aged 13-26 years who have not received the vaccine previously should obtain the 3-dose series. The vaccine is not recommended for use in pregnant females. However, pregnancy testing is not needed before receiving a dose. If a female is found to be pregnant after receiving a dose, no treatment is needed. In that case, the remaining doses should be delayed until after the pregnancy. Immunization is recommended for any person with an immunocompromised condition through the age of 26 years if she did not get any or all doses earlier. During the 3-dose series, the second dose should be obtained 4-8 weeks after the first dose. The third dose should be obtained 24 weeks after the first dose and 16 weeks after the second dose.  Zoster vaccine. One dose is recommended for adults aged 60 years or older unless certain conditions are  present.  Measles, mumps, and rubella (MMR) vaccine. Adults born before 1957 generally are considered immune to measles and mumps. Adults born in 1957 or later should have 1 or more doses of MMR vaccine unless there is a contraindication to the vaccine or there is laboratory evidence of immunity to   each of the three diseases. A routine second dose of MMR vaccine should be obtained at least 28 days after the first dose for students attending postsecondary schools, health care workers, or international travelers. People who received inactivated measles vaccine or an unknown type of measles vaccine during 1963-1967 should receive 2 doses of MMR vaccine. People who received inactivated mumps vaccine or an unknown type of mumps vaccine before 1979 and are at high risk for mumps infection should consider immunization with 2 doses of MMR vaccine. For females of childbearing age, rubella immunity should be determined. If there is no evidence of immunity, females who are not pregnant should be vaccinated. If there is no evidence of immunity, females who are pregnant should delay immunization until after pregnancy. Unvaccinated health care workers born before 1957 who lack laboratory evidence of measles, mumps, or rubella immunity or laboratory confirmation of disease should consider measles and mumps immunization with 2 doses of MMR vaccine or rubella immunization with 1 dose of MMR vaccine.  Pneumococcal 13-valent conjugate (PCV13) vaccine. When indicated, a person who is uncertain of her immunization history and has no record of immunization should receive the PCV13 vaccine. An adult aged 19 years or older who has certain medical conditions and has not been previously immunized should receive 1 dose of PCV13 vaccine. This PCV13 should be followed with a dose of pneumococcal polysaccharide (PPSV23) vaccine. The PPSV23 vaccine dose should be obtained at least 8 weeks after the dose of PCV13 vaccine. An adult aged 19  years or older who has certain medical conditions and previously received 1 or more doses of PPSV23 vaccine should receive 1 dose of PCV13. The PCV13 vaccine dose should be obtained 1 or more years after the last PPSV23 vaccine dose.  Pneumococcal polysaccharide (PPSV23) vaccine. When PCV13 is also indicated, PCV13 should be obtained first. All adults aged 65 years and older should be immunized. An adult younger than age 65 years who has certain medical conditions should be immunized. Any person who resides in a nursing home or long-term care facility should be immunized. An adult smoker should be immunized. People with an immunocompromised condition and certain other conditions should receive both PCV13 and PPSV23 vaccines. People with human immunodeficiency virus (HIV) infection should be immunized as soon as possible after diagnosis. Immunization during chemotherapy or radiation therapy should be avoided. Routine use of PPSV23 vaccine is not recommended for American Indians, [place] Natives, or people younger than 65 years unless there are medical conditions that require PPSV23 vaccine. When indicated, people who have unknown immunization and have no record of immunization should receive PPSV23 vaccine. One-time revaccination 5 years after the first dose of PPSV23 is recommended for people aged 19-64 years who have chronic kidney failure, nephrotic syndrome, asplenia, or immunocompromised conditions. People who received 1-2 doses of PPSV23 before age 65 years should receive another dose of PPSV23 vaccine at age 65 years or later if at least 5 years have passed since the previous dose. Doses of PPSV23 are not needed for people immunized with PPSV23 at or after age 65 years.  Meningococcal vaccine. Adults with asplenia or persistent complement component deficiencies should receive 2 doses of quadrivalent meningococcal conjugate (MenACWY-D) vaccine. The doses should be obtained at least 2 months apart.  Microbiologists working with certain meningococcal bacteria, military recruits, people at risk during an outbreak, and people who travel to or live in countries with a high rate of meningitis should be immunized. A first-year college student up through age   21 years who is living in a residence hall should receive a dose if she did not receive a dose on or after her 16th birthday. Adults who have certain high-risk conditions should receive one or more doses of vaccine.  Hepatitis A vaccine. Adults who wish to be protected from this disease, have certain high-risk conditions, work with hepatitis A-infected animals, work in hepatitis A research labs, or travel to or work in countries with a high rate of hepatitis A should be immunized. Adults who were previously unvaccinated and who anticipate close contact with an international adoptee during the first 60 days after arrival in the [place] from a country with a high rate of hepatitis A should be immunized.  Hepatitis B vaccine. Adults who wish to be protected from this disease, have certain high-risk conditions, may be exposed to blood or other infectious body fluids, are household contacts or sex partners of hepatitis B positive people, are clients or workers in certain care facilities, or travel to or work in countries with a high rate of hepatitis B should be immunized.  Haemophilus influenzae type b (Hib) vaccine. A previously unvaccinated person with asplenia or sickle cell disease or having a scheduled splenectomy should receive 1 dose of Hib vaccine. Regardless of previous immunization, a recipient of a hematopoietic stem cell transplant should receive a 3-dose series 6-12 months after her successful transplant. Hib vaccine is not recommended for adults with HIV infection. Preventive Services / Frequency Ages 64 to 68 years  Blood pressure check.** / Every 1 to 2 years.  Lipid and cholesterol check.** / Every 5 years beginning at age  22.  Clinical breast exam.** / Every 3 years for women in their 88s and 53s.  BRCA-related cancer risk assessment.** / For women who have family members with a BRCA-related cancer (breast, ovarian, tubal, or peritoneal cancers).  Pap test.** / Every 2 years from ages 90 through 51. Every 3 years starting at age 21 through age 56 or 3 with a history of 3 consecutive normal Pap tests.  HPV screening.** / Every 3 years from ages 24 through ages 1 to 46 with a history of 3 consecutive normal Pap tests.  Hepatitis C blood test.** / For any individual with known risks for hepatitis C.  Skin self-exam. / Monthly.  Influenza vaccine. / Every year.  Tetanus, diphtheria, and acellular pertussis (Tdap, Td) vaccine.** / Consult your health care provider. Pregnant women should receive 1 dose of Tdap vaccine during each pregnancy. 1 dose of Td every 10 years.  Varicella vaccine.** / Consult your health care provider. Pregnant females who do not have evidence of immunity should receive the first dose after pregnancy.  HPV vaccine. / 3 doses over 6 months, if 72 and younger. The vaccine is not recommended for use in pregnant females. However, pregnancy testing is not needed before receiving a dose.  Measles, mumps, rubella (MMR) vaccine.** / You need at least 1 dose of MMR if you were born in 1957 or later. You may also need a 2nd dose. For females of childbearing age, rubella immunity should be determined. If there is no evidence of immunity, females who are not pregnant should be vaccinated. If there is no evidence of immunity, females who are pregnant should delay immunization until after pregnancy.  Pneumococcal 13-valent conjugate (PCV13) vaccine.** / Consult your health care provider.  Pneumococcal polysaccharide (PPSV23) vaccine.** / 1 to 2 doses if you smoke cigarettes or if you have certain conditions.  Meningococcal vaccine.** /  1 dose if you are age 19 to 21 years and a first-year college  student living in a residence hall, or have one of several medical conditions, you need to get vaccinated against meningococcal disease. You may also need additional booster doses.  Hepatitis A vaccine.** / Consult your health care provider.  Hepatitis B vaccine.** / Consult your health care provider.  Haemophilus influenzae type b (Hib) vaccine.** / Consult your health care provider. Ages 40 to 64 years  Blood pressure check.** / Every 1 to 2 years.  Lipid and cholesterol check.** / Every 5 years beginning at age 20 years.  Lung cancer screening. / Every year if you are aged 55-80 years and have a 30-pack-year history of smoking and currently smoke or have quit within the past 15 years. Yearly screening is stopped once you have quit smoking for at least 15 years or develop a health problem that would prevent you from having lung cancer treatment.  Clinical breast exam.** / Every year after age 40 years.  BRCA-related cancer risk assessment.** / For women who have family members with a BRCA-related cancer (breast, ovarian, tubal, or peritoneal cancers).  Mammogram.** / Every year beginning at age 40 years and continuing for as long as you are in good health. Consult with your health care provider.  Pap test.** / Every 3 years starting at age 30 years through age 65 or 70 years with a history of 3 consecutive normal Pap tests.  HPV screening.** / Every 3 years from ages 30 years through ages 65 to 70 years with a history of 3 consecutive normal Pap tests.  Fecal occult blood test (FOBT) of stool. / Every year beginning at age 50 years and continuing until age 75 years. You may not need to do this test if you get a colonoscopy every 10 years.  Flexible sigmoidoscopy or colonoscopy.** / Every 5 years for a flexible sigmoidoscopy or every 10 years for a colonoscopy beginning at age 50 years and continuing until age 75 years.  Hepatitis C blood test.** / For all people born from 1945 through  1965 and any individual with known risks for hepatitis C.  Skin self-exam. / Monthly.  Influenza vaccine. / Every year.  Tetanus, diphtheria, and acellular pertussis (Tdap/Td) vaccine.** / Consult your health care provider. Pregnant women should receive 1 dose of Tdap vaccine during each pregnancy. 1 dose of Td every 10 years.  Varicella vaccine.** / Consult your health care provider. Pregnant females who do not have evidence of immunity should receive the first dose after pregnancy.  Zoster vaccine.** / 1 dose for adults aged 60 years or older.  Measles, mumps, rubella (MMR) vaccine.** / You need at least 1 dose of MMR if you were born in 1957 or later. You may also need a 2nd dose. For females of childbearing age, rubella immunity should be determined. If there is no evidence of immunity, females who are not pregnant should be vaccinated. If there is no evidence of immunity, females who are pregnant should delay immunization until after pregnancy.  Pneumococcal 13-valent conjugate (PCV13) vaccine.** / Consult your health care provider.  Pneumococcal polysaccharide (PPSV23) vaccine.** / 1 to 2 doses if you smoke cigarettes or if you have certain conditions.  Meningococcal vaccine.** / Consult your health care provider.  Hepatitis A vaccine.** / Consult your health care provider.  Hepatitis B vaccine.** / Consult your health care provider.  Haemophilus influenzae type b (Hib) vaccine.** / Consult your health care provider. Ages 65   years and over  Blood pressure check.** / Every 1 to 2 years.  Lipid and cholesterol check.** / Every 5 years beginning at age 31 years.  Lung cancer screening. / Every year if you are aged 30-80 years and have a 30-pack-year history of smoking and currently smoke or have quit within the past 15 years. Yearly screening is stopped once you have quit smoking for at least 15 years or develop a health problem that would prevent you from having lung cancer  treatment.  Clinical breast exam.** / Every year after age 1 years.  BRCA-related cancer risk assessment.** / For women who have family members with a BRCA-related cancer (breast, ovarian, tubal, or peritoneal cancers).  Mammogram.** / Every year beginning at age 9 years and continuing for as long as you are in good health. Consult with your health care provider.  Pap test.** / Every 3 years starting at age 56 years through age 40 or 64 years with 3 consecutive normal Pap tests. Testing can be stopped between 65 and 70 years with 3 consecutive normal Pap tests and no abnormal Pap or HPV tests in the past 10 years.  HPV screening.** / Every 3 years from ages 52 years through ages 22 or 96 years with a history of 3 consecutive normal Pap tests. Testing can be stopped between 65 and 70 years with 3 consecutive normal Pap tests and no abnormal Pap or HPV tests in the past 10 years.  Fecal occult blood test (FOBT) of stool. / Every year beginning at age 72 years and continuing until age 49 years. You may not need to do this test if you get a colonoscopy every 10 years.  Flexible sigmoidoscopy or colonoscopy.** / Every 5 years for a flexible sigmoidoscopy or every 10 years for a colonoscopy beginning at age 65 years and continuing until age 61 years.  Hepatitis C blood test.** / For all people born from [birth year] through 1965 and any individual with known risks for hepatitis C.  Osteoporosis screening.** / A one-time screening for women ages 81 years and over and women at risk for fractures or osteoporosis.  Skin self-exam. / Monthly.  Influenza vaccine. / Every year.  Tetanus, diphtheria, and acellular pertussis (Tdap/Td) vaccine.** / 1 dose of Td every 10 years.  Varicella vaccine.** / Consult your health care provider.  Zoster vaccine.** / 1 dose for adults aged 65 years or older.  Pneumococcal 13-valent conjugate (PCV13) vaccine.** / Consult your health care provider.  Pneumococcal  polysaccharide (PPSV23) vaccine.** / 1 dose for all adults aged 40 years and older.  Meningococcal vaccine.** / Consult your health care provider.  Hepatitis A vaccine.** / Consult your health care provider.  Hepatitis B vaccine.** / Consult your health care provider.  Haemophilus influenzae type b (Hib) vaccine.** / Consult your health care provider. ** Family history and personal history of risk and conditions may change your health care provider's recommendations. Document Released: 06/03/2001 Document Revised: 08/22/2013 Document Reviewed: 09/02/2010 [facility] Patient Information 2015 [place], [place]. This information is not intended to replace advice given to you by your health care provider. Make sure you discuss any questions you have with your health care provider. Smoking Cessation Quitting smoking is important to your health and has many advantages. However, it is not always easy to quit since nicotine is a very addictive drug. Oftentimes, people try 3 times or more before being able to quit. This document explains the best ways for you to prepare to quit smoking. Quitting  takes hard work and a lot of effort, but you can do it. ADVANTAGES OF QUITTING SMOKING  You will live longer, feel better, and live better.  Your body will feel the impact of quitting smoking almost immediately.  Within 20 minutes, blood pressure decreases. Your pulse returns to its normal level.  After 8 hours, carbon monoxide levels in the blood return to normal. Your oxygen level increases.  After 24 hours, the chance of having a heart attack starts to decrease. Your breath, hair, and body stop smelling like smoke.  After 48 hours, damaged nerve endings begin to recover. Your sense of taste and smell improve.  After 72 hours, the body is virtually free of nicotine. Your bronchial tubes relax and breathing becomes easier.  After 2 to 12 weeks, lungs can hold more air. Exercise becomes easier and circulation  improves.  The risk of having a heart attack, stroke, cancer, or lung disease is greatly reduced.  After 1 year, the risk of coronary heart disease is cut in half.  After 5 years, the risk of stroke falls to the same as a nonsmoker.  After 10 years, the risk of lung cancer is cut in half and the risk of other cancers decreases significantly.  After 15 years, the risk of coronary heart disease drops, usually to the level of a nonsmoker.  If you are pregnant, quitting smoking will improve your chances of having a healthy baby.  The people you live with, especially any children, will be healthier.  You will have extra money to spend on things other than cigarettes. QUESTIONS TO THINK ABOUT BEFORE ATTEMPTING TO QUIT You may want to talk about your answers with your health care provider.  Why do you want to quit?  If you tried to quit in the past, what helped and what did not?  What will be the most difficult situations for you after you quit? How will you plan to handle them?  Who can help you through the tough times? Your family? Friends? A health care provider?  What pleasures do you get from smoking? What ways can you still get pleasure if you quit? Here are some questions to ask your health care provider:  How can you help me to be successful at quitting?  What medicine do you think would be best for me and how should I take it?  What should I do if I need more help?  What is smoking withdrawal like? How can I get information on withdrawal? GET READY  Set a quit date.  Change your environment by getting rid of all cigarettes, ashtrays, matches, and lighters in your home, car, or work. Do not let people smoke in your home.  Review your past attempts to quit. Think about what worked and what did not. GET SUPPORT AND ENCOURAGEMENT You have a better chance of being successful if you have help. You can get support in many ways.  Tell your family, friends, and coworkers that  you are going to quit and need their support. Ask them not to smoke around you.  Get individual, group, or telephone counseling and support. Programs are available at [employer] and health centers. Call your local health department for information about programs in your area.  Spiritual beliefs and practices may help some smokers quit.  Download a "quit meter" on your computer to keep track of quit statistics, such as how long you have gone without smoking, cigarettes not smoked, and money saved.  Get a  self-help book about quitting smoking and staying off tobacco. [place] yourself from urges to smoke. Talk to someone, go for a walk, or occupy your time with a task.  Change your normal routine. Take a different route to work. Drink tea instead of coffee. Eat breakfast in a different place.  Reduce your stress. Take a hot bath, exercise, or read a book.  Plan something enjoyable to do every day. Reward yourself for not smoking.  Explore interactive web-based programs that specialize in helping you quit. GET MEDICINE AND USE IT CORRECTLY Medicines can help you stop smoking and decrease the urge to smoke. Combining medicine with the above behavioral methods and support can greatly increase your chances of successfully quitting smoking.  Nicotine replacement therapy helps deliver nicotine to your body without the negative effects and risks of smoking. Nicotine replacement therapy includes nicotine gum, lozenges, inhalers, nasal sprays, and skin patches. Some may be available over-the-counter and others require a prescription.  Antidepressant medicine helps people abstain from smoking, but how this works is unknown. This medicine is available by prescription.  Nicotinic receptor partial agonist medicine simulates the effect of nicotine in your brain. This medicine is available by prescription. Ask your health care provider for advice about which medicines  to use and how to use them based on your health history. Your health care provider will tell you what side effects to look out for if you choose to be on a medicine or therapy. Carefully read the information on the package. Do not use any other product containing nicotine while using a nicotine replacement product.  RELAPSE OR DIFFICULT SITUATIONS Most relapses occur within the first 3 months after quitting. Do not be discouraged if you start smoking again. Remember, most people try several times before finally quitting. You may have symptoms of withdrawal because your body is used to nicotine. You may crave cigarettes, be irritable, feel very hungry, cough often, get headaches, or have difficulty concentrating. The withdrawal symptoms are only temporary. They are strongest when you first quit, but they will go away within 10-14 days. To reduce the chances of relapse, try to:  Avoid drinking alcohol. Drinking lowers your chances of successfully quitting.  Reduce the amount of caffeine you consume. Once you quit smoking, the amount of caffeine in your body increases and can give you symptoms, such as a rapid heartbeat, sweating, and anxiety.  Avoid smokers because they can make you want to smoke.  Do not let weight gain distract you. Many smokers will gain weight when they quit, usually less than 10 pounds. Eat a healthy diet and stay active. You can always lose the weight gained after you quit.  Find ways to improve your mood other than smoking. FOR MORE INFORMATION  www.smokefree.gov  Document Released: 04/01/2001 Document Revised: 08/22/2013 Document Reviewed: 07/17/2011 [facility] Patient Information 2015 [place], [place]. This information is not intended to replace advice given to you by your health care provider. Make sure you discuss any questions you have with your health care provider. Preventive Dental Care Preventative dental care is an important part of overall health for children and  adults. Regular care of the teeth and gums can prevent tooth decay and gum disease. Substances are created in your mouth every day that need to be removed, including:  Plaque. This is a sticky substance around the teeth and gums.  Tartar. This is a hardened form of plaque. You can remove plaque and tartar by brushing and  flossing. Brushing and flossing will also help prevent cavities, gingivitis, and tooth loss. Your dentist can remove the plaque and tartar not removed with regular daily care. A dentist can also find other dental problems. Problems that are detected early can be treated conservatively and with less expense. BABIES, TODDLERS, AND PRESCHOOL AGED CHILDREN  Clean your baby's gums after feedings with a wet washcloth (water only).  Do not give your baby a bottle with milk, formula, or juice for sipping before they go to sleep.  Brush your baby's emerging teeth with a small toothbrush and water 2 times per day.  Begin flossing your baby's teeth when they are in contact with one another.  Many dental problems are preventable with early assessment. Take your child to a dentist when the first tooth erupts or by age 3. Discuss risks for tooth decay, your child's growth and development, fluoride needs, oral habits, proper diet, and oral hygiene.  Bring your child to the dentist every 6 months.  Even when your child is over age 93, brush your child's teeth for him or her. Use a small pea-sized amount of fluoride toothpaste. Make sure your child does not swallow the toothpaste.  Contact your dentist if your child has pain or other problems in the mouth. SCHOOL AGED CHILDREN AND ADOLESCENTS  Continue to brush your child's teeth 2 times per day with a child toothbrush and pea-sized amount of toothpaste until your child is 71 to 78 years old.  Help your child floss until he or she can do it properly.  Bring your child to the dentist every 6 months for professional cleanings and  exams.  If your child is involved in contact sports, make sure he or she wears a properly fitted mouth guard.  Ask your dentist about dental sealants. This is a coating painted onto the molars to prevent decay.   Encourage a healthy diet. Help your child limit sugary drinks and foods.  Make sure your child has an early orthodontic evaluation. Your child should have an evaluation by about age 40. Many orthodontic problems can be treated early, preventing the need for full fixed braces in the future.  Talk to your adolescent about the risks of oral piercings and smoking. Help your child avoid these risks.  Contact your dental caregiver if your child has pain or other problems in the mouth. ADULTS  Brush at least 2 times per day and after meals if possible. Brush for at least 2 minutes.  Use a fluoride toothpaste or drink fluoridated water.  Floss daily.  Keep retainers, dentures, or other mouth devices clean and sanitized. Soak or brush them as directed.  Eat a healthy diet. Limit sugary drinks and foods.  See your dentist every 6 months.  Follow up with your dentist as directed.  Always see your dentist at the first sign of tooth or gum pain.  Avoid smoking.  Limit alcohol. ELDERLY OR THOSE WITH A CHRONIC HEALTH CONDITION Follow the adult guidelines above, and in addition:  Manage chronic conditions, such as diabetes. Certain conditions can increase your risk of gum disease.  If you experience dry mouth from medicines, ask your caregiver about treatment options. Try drinking more water and chewing sugarless gum. Avoid alcohol.  Visit your dentist prior to cancer treatment to take care of any problems. SEEK IMMEDIATE DENTAL CARE IF:  You develop pain, bleeding, or soreness in the gum, tooth, jaw, or mouth area.  A permanent tooth becomes loose or separated from the  gum socket.  You experience a blow or injury to the mouth or jaw area. Document Released: 08/02/2010  Document Revised: 06/30/2011 Document Reviewed: 08/02/2010 [facility] Patient Information 2015 [place], [place]. This information is not intended to replace advice given to you by your health care provider. Make sure you discuss any questions you have with your health care provider.  Please schedule appointment in 1 month for recheck of her urine to check for blood and consider smoking cessation. Discuss weight loss as well. Pt declined fluvaccine today.

## 2014-01-20 NOTE — Assessment & Plan Note (Signed)
Pt declined flu vaccine. Up to date on tetanus. Did order cmp , cbc, tsh, and lipid panel today. Pt gets pap and mammo thru her gynecologist office. She will go ahead and set up gynecologist appointment. She prefers this.

## 2014-01-23 ENCOUNTER — Telehealth: Payer: Self-pay | Admitting: Family Medicine

## 2014-01-23 ENCOUNTER — Ambulatory Visit
Admission: RE | Admit: 2014-01-23 | Discharge: 2014-01-23 | Disposition: A | Discharge status: Home / Self Care | Payer: BC Managed Care – PPO | Source: Ambulatory Visit

## 2014-01-23 DIAGNOSIS — Z1231 Encounter for screening mammogram for malignant neoplasm of breast: Secondary | ICD-10-CM

## 2014-01-23 NOTE — Telephone Encounter (Signed)
emmi emailed °

## 2014-02-07 ENCOUNTER — Telehealth: Payer: Self-pay | Admitting: Family Medicine

## 2014-02-07 DIAGNOSIS — M25579 Pain in unspecified ankle and joints of unspecified foot: Secondary | ICD-10-CM

## 2014-02-07 NOTE — Telephone Encounter (Signed)
Caller name: Iola K Relation to pt: Call back [phone] Pharmacy:  Reason for call: Pt requesting for lab results on 10.02.15 has not receive anything on labs result. Pt states that she wants to have uric acid lab test per convo with Percell Miller. Pt is also requesting antibiotic that also was spoken with Percell Miller still not feeling well. Please advise.

## 2014-02-10 ENCOUNTER — Other Ambulatory Visit: Payer: Self-pay

## 2014-02-10 DIAGNOSIS — I10 Essential (primary) hypertension: Secondary | ICD-10-CM

## 2014-02-10 DIAGNOSIS — K2100 Gastro-esophageal reflux disease with esophagitis, without bleeding: Secondary | ICD-10-CM

## 2014-02-10 MED ORDER — BENAZEPRIL HCL 10 MG PO TABS: 10.0000 mg | ORAL_TABLET | Freq: Every day | ORAL | Status: DC

## 2014-02-10 MED ORDER — FLUCONAZOLE 150 MG PO TABS: 150.0000 mg | ORAL_TABLET | Freq: Once | ORAL | Status: DC

## 2014-02-10 MED ORDER — OMEPRAZOLE 20 MG PO CPDR: 20.0000 mg | DELAYED_RELEASE_CAPSULE | Freq: Every day | ORAL | Status: DC

## 2014-02-10 MED ORDER — AZITHROMYCIN 250 MG PO TABS: ORAL_TABLET | ORAL | Status: DC

## 2014-02-10 MED ORDER — HYDROCHLOROTHIAZIDE 25 MG PO TABS: 25.0000 mg | ORAL_TABLET | Freq: Every day | ORAL | Status: DC

## 2014-02-10 NOTE — Telephone Encounter (Signed)
Pt labs look good. She states she was not notified of results. Will get Santiago Glad to look at lab/note section and advise pt of the results. Also ask pt to notify pt that I am placing uric acid order for her to do. Call in advance and be put on lab schedule. Also will call in azithromycin to her pharmacy. Ask Santiago Glad to notify pt that if after antibiotic if not better by early next week then needs re-evaluation/office visit.

## 2014-02-10 NOTE — Telephone Encounter (Signed)
Called patient regarding labs. Advised to come in to have Uric acid drawn. States she will call and be placed on schedule. Requested 3 medications be refilled. Refilled per patient request.

## 2014-02-20 ENCOUNTER — Encounter: Payer: Self-pay | Admitting: Medical

## 2014-03-21 ENCOUNTER — Telehealth: Payer: Self-pay | Admitting: Family Medicine

## 2014-03-21 NOTE — Telephone Encounter (Signed)
Caller name: Tracia, [place] Relation to pt: self  Call back number: [phone]   Reason for call:  Pt checking on the status of her dietary medication

## 2014-03-24 ENCOUNTER — Other Ambulatory Visit (INDEPENDENT_AMBULATORY_CARE_PROVIDER_SITE_OTHER): Payer: BC Managed Care – PPO

## 2014-03-24 DIAGNOSIS — M25579 Pain in unspecified ankle and joints of unspecified foot: Secondary | ICD-10-CM

## 2014-03-24 NOTE — Telephone Encounter (Signed)
Pt is following up on previous message, states she has not received a call back yet

## 2014-03-26 LAB — URIC ACID: Uric Acid, Serum: 5 mg/dL (ref 2.4–7.0)

## 2014-05-05 ENCOUNTER — Encounter: Payer: BC Managed Care – PPO | Admitting: Women's Health

## 2014-05-10 ENCOUNTER — Encounter: Payer: Self-pay | Admitting: Medical

## 2014-05-10 ENCOUNTER — Ambulatory Visit (INDEPENDENT_AMBULATORY_CARE_PROVIDER_SITE_OTHER): Payer: BLUE CROSS/BLUE SHIELD | Admitting: Medical

## 2014-05-10 MED ORDER — LORCASERIN HCL 10 MG PO TABS: 10.0000 mg | ORAL_TABLET | Freq: Two times a day (BID) | ORAL | Status: DC

## 2014-05-10 NOTE — Progress Notes (Signed)
Subjective:    Patient ID: Tiffany Gallagher, female    DOB: 1965-05-23, 49 y.o.   MRN: [id number]  HPI    Pt in for follow up. Pt blood pressure is better today.   Pt to discuss weight loss medication. Pt has high bmi. Pt tried various diets in the past. Pt tried phentermine in the past and her blood pressure did increase. Her weight is increased by 2 pounds since last visit.  Pt does not count calories. Sometimes overeats after only eating only eating one meal a day.   Pt does take tramadol but she needs very rarely.   Past Medical History  Diagnosis Date  . GERD (gastroesophageal reflux disease)   . Hyperlipidemia   . Migraines   . Hypertension   . Smoker     HALF PPD  . Fibroid   . Anemia     History   Social History  . Marital Status: Single    Spouse Name: N/A    Number of Children: 1  . Years of Education: 17   Occupational History  . customer service     Social History Main Topics  . Smoking status: Current Every Day Smoker -- 0.50 packs/day for 30 years    Types: Cigarettes  . Smokeless tobacco: Never Used     Comment: cutting back  . Alcohol Use: 0.0 oz/week     Comment: rarely  . Drug Use: No  . Sexual Activity:    Partners: Male    Birth Control/ Protection: Surgical, Condom   Other Topics Concern  . Not on file   Social History Narrative   Exercise-- no    Past Surgical History  Procedure Laterality Date  . Cesarean section  1995  . Vaginal hysterectomy  1998    partial    Family History  Problem Relation Age of Onset  . Coronary artery disease Mother   . Hypertension Mother   . Kidney disease Mother 1    dialysis  . Aneurysm Mother     brain  . Stroke Mother   . Heart disease Mother     MI  . Cancer Father 39    brain cancer  . Diabetes Sister   . Arthritis Brother   . Hypertension Brother   . Diabetes Brother   . Hypertension Brother     Allergies  Allergen Reactions  . Lipitor [Atorvastatin Calcium]     Nerves,  muscle spasms, light stool    Current Outpatient Prescriptions on File Prior to Visit  Medication Sig Dispense Refill  . baclofen (LIORESAL) 10 MG tablet Take 1 tablet (10 mg total) by mouth at bedtime as needed (muscle spasms). 30 each 1  . benazepril (LOTENSIN) 10 MG tablet Take 1 tablet (10 mg total) by mouth daily. 90 tablet 1  . hydrochlorothiazide (HYDRODIURIL) 25 MG tablet Take 1 tablet (25 mg total) by mouth daily. 90 tablet 3  . meloxicam (MOBIC) 15 MG tablet Take 1 tablet (15 mg total) by mouth daily. 30 tablet 0  . Multiple Vitamin (MULTIVITAMIN WITH MINERALS) TABS Take 1 tablet by mouth daily.    Marland Kitchen omeprazole (PRILOSEC) 20 MG capsule Take 1 capsule (20 mg total) by mouth daily. 90 capsule 3  . simethicone (MYLICON) 80 MG chewable tablet Chew 80 mg by mouth 3 (three) times daily as needed for flatulence.     . traMADol (ULTRAM) 50 MG tablet Take 1 or 2 every 8 hours as needed for pain 40 tablet  0   No current facility-administered medications on file prior to visit.    BP 127/76 mmHg  Pulse 82  Temp(Src) 98.3 F (36.8 C) (Oral)  Ht 5' 6.5" (1.689 m)  Wt 302 lb 6.4 oz (137.168 kg)  BMI 48.08 kg/m2  SpO2 96%       Review of Systems  Constitutional: Negative for fever, chills and fatigue.  HENT: Negative for congestion, dental problem, ear discharge, ear pain, nosebleeds, postnasal drip, rhinorrhea, sinus pressure, sore throat and trouble swallowing.   Respiratory: Negative for cough, chest tightness, shortness of breath and wheezing.   Cardiovascular: Negative for chest pain and palpitations.  Gastrointestinal: Negative for nausea, vomiting, abdominal pain, diarrhea and constipation.  Genitourinary: Negative for dysuria and flank pain.  Musculoskeletal: Negative for back pain.  Neurological: Negative for dizziness, tremors, seizures, syncope, weakness, light-headedness, numbness and headaches.  Hematological: Negative for adenopathy. Does not bruise/bleed easily.    Psychiatric/Behavioral: Negative for suicidal ideas, behavioral problems and dysphoric mood. The patient is not nervous/anxious.        Objective:   Physical Exam   General Mental Status- Alert. General Appearance- Not in acute distress.   Skin General: Color- Normal Color. Moisture- Normal Moisture.  Neck Carotid Arteries- Normal color. Moisture- Normal Moisture. No carotid bruits. No JVD.  Chest and Lung Exam Auscultation: Breath Sounds:-Normal.  Cardiovascular Auscultation:Rythm- Regular. Murmurs & Other Heart Sounds:Auscultation of the heart reveals- No Murmurs.  Abdomen Inspection:-Inspeection Normal. Palpation/Percussion:Note:No mass.(But obese abdomen) Palpation and Percussion of the abdomen reveal- Non Tender, Non Distended + BS, no rebound or guarding.    Neurologic Cranial Nerve exam:- CN III-XII intact(No nystagmus), symmetric smile. Drift Test:- No drift. Romberg Exam:- Negative.  Heal to Toe Gait exam:-Normal. Finger to Nose:- Normal/Intact Strength:- 5/5 equal and symmetric strength both upper and lower extremities.         Assessment & Plan:

## 2014-05-10 NOTE — Assessment & Plan Note (Signed)
Bp controlled today. Continue current management.

## 2014-05-10 NOTE — Assessment & Plan Note (Signed)
Benefits and risk of belviq  medication discussed with pt. Advised no tramadol use while on belviq. Follow up in one month check up or as needed.  Pt signed controlled med agreement and advised to go to lab for uds.

## 2014-05-10 NOTE — Progress Notes (Signed)
Pre visit review using our clinic review tool, if applicable. No additional management support is needed unless otherwise documented below in the visit note. 

## 2014-05-10 NOTE — Patient Instructions (Signed)
We will try belviq for weight loss. Also diet and exercise. Follow up in one month for check on weight loss. If any side effects before then stop and let us know.  No tramadol use while on. Use meloxicam. May have to use norco if meloxicam does not control your pain.  Continue your same bp medication.

## 2014-05-17 ENCOUNTER — Encounter: Payer: Self-pay | Admitting: Women's Health

## 2014-05-26 ENCOUNTER — Other Ambulatory Visit: Payer: Self-pay | Admitting: Family Medicine

## 2014-05-31 ENCOUNTER — Encounter: Payer: Self-pay | Admitting: Women's Health

## 2014-06-12 ENCOUNTER — Telehealth: Payer: Self-pay | Admitting: Family Medicine

## 2014-06-12 NOTE — Telephone Encounter (Signed)
Caller name: Amyiah Relation to pt: self Call back number: [phone] Pharmacy:  Reason for call:   Patient states that she was prescribed belviq and this needs a prior approval. Patient states that this needs to be approved through discount card, P: [phone] member id# [id number] and Lorella Nimrod [phone], [facility]

## 2014-06-14 NOTE — Telephone Encounter (Signed)
PA initiated. Awaiting determination. JG//CMA 

## 2014-06-15 ENCOUNTER — Ambulatory Visit (INDEPENDENT_AMBULATORY_CARE_PROVIDER_SITE_OTHER): Payer: BLUE CROSS/BLUE SHIELD | Admitting: Women's Health

## 2014-06-15 ENCOUNTER — Encounter: Payer: Self-pay | Admitting: Women's Health

## 2014-06-15 VITALS — BP 126/80 | Ht 66.0 in | Wt 304.0 lb

## 2014-06-15 DIAGNOSIS — Z113 Encounter for screening for infections with a predominantly sexual mode of transmission: Secondary | ICD-10-CM

## 2014-06-15 DIAGNOSIS — Z01419 Encounter for gynecological examination (general) (routine) without abnormal findings: Secondary | ICD-10-CM

## 2014-06-15 DIAGNOSIS — B009 Herpesviral infection, unspecified: Secondary | ICD-10-CM

## 2014-06-15 MED ORDER — VALACYCLOVIR HCL 500 MG PO TABS: ORAL_TABLET | ORAL | Status: DC

## 2014-06-15 NOTE — Patient Instructions (Signed)
Health Maintenance Adopting a healthy lifestyle and getting preventive care can go a long way to promote health and wellness. Talk with your health care provider about what schedule of regular examinations is right for you. This is a good chance for you to check in with your provider about disease prevention and staying healthy. In between checkups, there are plenty of things you can do on your own. Experts have done a lot of research about which lifestyle changes and preventive measures are most likely to keep you healthy. Ask your health care provider for more information. WEIGHT AND DIET  Eat a healthy diet  Be sure to include plenty of vegetables, fruits, low-fat dairy products, and lean protein.  Do not eat a lot of foods high in solid fats, added sugars, or salt.  Get regular exercise. This is one of the most important things you can do for your health.  Most adults should exercise for at least 150 minutes each week. The exercise should increase your heart rate and make you sweat (moderate-intensity exercise).  Most adults should also do strengthening exercises at least twice a week. This is in addition to the moderate-intensity exercise.  Maintain a healthy weight  Body mass index (BMI) is a measurement that can be used to identify possible weight problems. It estimates body fat based on height and weight. Your health care provider can help determine your BMI and help you achieve or maintain a healthy weight.  For females 25 years of age and older:   A BMI below 18.5 is considered underweight.  A BMI of 18.5 to 24.9 is normal.  A BMI of 25 to 29.9 is considered overweight.  A BMI of 30 and above is considered obese.  Watch levels of cholesterol and blood lipids  You should start having your blood tested for lipids and cholesterol at 49 years of age, then have this test every 5 years.  You may need to have your cholesterol levels checked more often if:  Your lipid or  cholesterol levels are high.  You are older than 49 years of age.  You are at high risk for heart disease.  CANCER SCREENING   Lung Cancer  Lung cancer screening is recommended for adults 97-92 years old who are at high risk for lung cancer because of a history of smoking.  A yearly low-dose CT scan of the lungs is recommended for people who:  Currently smoke.  Have quit within the past 15 years.  Have at least a 30-pack-year history of smoking. A pack year is smoking an average of one pack of cigarettes a day for 1 year.  Yearly screening should continue until it has been 15 years since you quit.  Yearly screening should stop if you develop a health problem that would prevent you from having lung cancer treatment.  Breast Cancer  Practice breast self-awareness. This means understanding how your breasts normally appear and feel.  It also means doing regular breast self-exams. Let your health care provider know about any changes, no matter how small.  If you are in your 20s or 30s, you should have a clinical breast exam (CBE) by a health care provider every 1-3 years as part of a regular health exam.  If you are 76 or older, have a CBE every year. Also consider having a breast X-ray (mammogram) every year.  If you have a family history of breast cancer, talk to your health care provider about genetic screening.  If you are  at high risk for breast cancer, talk to your health care provider about having an MRI and a mammogram every year.  Breast cancer gene (BRCA) assessment is recommended for women who have family members with BRCA-related cancers. BRCA-related cancers include:  Breast.  Ovarian.  Tubal.  Peritoneal cancers.  Results of the assessment will determine the need for genetic counseling and BRCA1 and BRCA2 testing. Cervical Cancer Routine pelvic examinations to screen for cervical cancer are no longer recommended for nonpregnant women who are considered low  risk for cancer of the pelvic organs (ovaries, uterus, and vagina) and who do not have symptoms. A pelvic examination may be necessary if you have symptoms including those associated with pelvic infections. Ask your health care provider if a screening pelvic exam is right for you.   The Pap test is the screening test for cervical cancer for women who are considered at risk.  If you had a hysterectomy for a problem that was not cancer or a condition that could lead to cancer, then you no longer need Pap tests.  If you are older than 65 years, and you have had normal Pap tests for the past 10 years, you no longer need to have Pap tests.  If you have had past treatment for cervical cancer or a condition that could lead to cancer, you need Pap tests and screening for cancer for at least 20 years after your treatment.  If you no longer get a Pap test, assess your risk factors if they change (such as having a new sexual partner). This can affect whether you should start being screened again.  Some women have medical problems that increase their chance of getting cervical cancer. If this is the case for you, your health care provider may recommend more frequent screening and Pap tests.  The human papillomavirus (HPV) test is another test that may be used for cervical cancer screening. The HPV test looks for the virus that can cause cell changes in the cervix. The cells collected during the Pap test can be tested for HPV.  The HPV test can be used to screen women 30 years of age and older. Getting tested for HPV can extend the interval between normal Pap tests from three to five years.  An HPV test also should be used to screen women of any age who have unclear Pap test results.  After 49 years of age, women should have HPV testing as often as Pap tests.  Colorectal Cancer  This type of cancer can be detected and often prevented.  Routine colorectal cancer screening usually begins at 50 years of  age and continues through 49 years of age.  Your health care provider may recommend screening at an earlier age if you have risk factors for colon cancer.  Your health care provider may also recommend using home test kits to check for hidden blood in the stool.  A small camera at the end of a tube can be used to examine your colon directly (sigmoidoscopy or colonoscopy). This is done to check for the earliest forms of colorectal cancer.  Routine screening usually begins at age 50.  Direct examination of the colon should be repeated every 5-10 years through 49 years of age. However, you may need to be screened more often if early forms of precancerous polyps or small growths are found. Skin Cancer  Check your skin from head to toe regularly.  Tell your health care provider about any new moles or changes in   moles, especially if there is a change in a mole's shape or color.  Also tell your health care provider if you have a mole that is larger than the size of a pencil eraser.  Always use sunscreen. Apply sunscreen liberally and repeatedly throughout the day.  Protect yourself by wearing long sleeves, pants, a wide-brimmed hat, and sunglasses whenever you are outside. HEART DISEASE, DIABETES, AND HIGH BLOOD PRESSURE   Have your blood pressure checked at least every 1-2 years. High blood pressure causes heart disease and increases the risk of stroke.  If you are between 41 years and 24 years old, ask your health care provider if you should take aspirin to prevent strokes.  Have regular diabetes screenings. This involves taking a blood sample to check your fasting blood sugar level.  If you are at a normal weight and have a low risk for diabetes, have this test once every three years after 49 years of age.  If you are overweight and have a high risk for diabetes, consider being tested at a younger age or more often. PREVENTING INFECTION  Hepatitis B  If you have a higher risk for  hepatitis B, you should be screened for this virus. You are considered at high risk for hepatitis B if:  You were born in a country where hepatitis B is common. Ask your health care provider which countries are considered high risk.  Your parents were born in a high-risk country, and you have not been immunized against hepatitis B (hepatitis B vaccine).  You have HIV or AIDS.  You use needles to inject street drugs.  You live with someone who has hepatitis B.  You have had sex with someone who has hepatitis B.  You get hemodialysis treatment.  You take certain medicines for conditions, including cancer, organ transplantation, and autoimmune conditions. Hepatitis C  Blood testing is recommended for:  Everyone born from [birth year] through 1965.  Anyone with known risk factors for hepatitis C. Sexually transmitted infections (STIs)  You should be screened for sexually transmitted infections (STIs) including gonorrhea and chlamydia if:  You are sexually active and are younger than 49 years of age.  You are older than 49 years of age and your health care provider tells you that you are at risk for this type of infection.  Your sexual activity has changed since you were last screened and you are at an increased risk for chlamydia or gonorrhea. Ask your health care provider if you are at risk.  If you do not have HIV, but are at risk, it may be recommended that you take a prescription medicine daily to prevent HIV infection. This is called pre-exposure prophylaxis (PrEP). You are considered at risk if:  You are sexually active and do not regularly use condoms or know the HIV status of your partner(s).  You take drugs by injection.  You are sexually active with a partner who has HIV. Talk with your health care provider about whether you are at high risk of being infected with HIV. If you choose to begin PrEP, you should first be tested for HIV. You should then be tested every 3 months for  as long as you are taking PrEP.  PREGNANCY   If you are premenopausal and you may become pregnant, ask your health care provider about preconception counseling.  If you may become pregnant, take 400 to 800 micrograms (mcg) of folic acid every day.  If you want to prevent pregnancy, talk to your  health care provider about birth control (contraception). OSTEOPOROSIS AND MENOPAUSE   Osteoporosis is a disease in which the bones lose minerals and strength with aging. This can result in serious bone fractures. Your risk for osteoporosis can be identified using a bone density scan.  If you are 65 years of age or older, or if you are at risk for osteoporosis and fractures, ask your health care provider if you should be screened.  Ask your health care provider whether you should take a calcium or vitamin D supplement to lower your risk for osteoporosis.  Menopause may have certain physical symptoms and risks.  Hormone replacement therapy may reduce some of these symptoms and risks. Talk to your health care provider about whether hormone replacement therapy is right for you.  HOME CARE INSTRUCTIONS   Schedule regular health, dental, and eye exams.  Stay current with your immunizations.   Do not use any tobacco products including cigarettes, chewing tobacco, or electronic cigarettes.  If you are pregnant, do not drink alcohol.  If you are breastfeeding, limit how much and how often you drink alcohol.  Limit alcohol intake to no more than 1 drink per day for nonpregnant women. One drink equals 12 ounces of beer, 5 ounces of wine, or 1 ounces of hard liquor.  Do not use street drugs.  Do not share needles.  Ask your health care provider for help if you need support or information about quitting drugs.  Tell your health care provider if you often feel depressed.  Tell your health care provider if you have ever been abused or do not feel safe at home. Document Released: 10/21/2010  Document Revised: 08/22/2013 Document Reviewed: 03/09/2013 Exit[employer] Patient Information 2015 [employer]. This information is not intended to replace advice given to you by your health care provider. Make sure you discuss any questions you have with your health care provider. Exercise to Stay Healthy Exercise helps you become and stay healthy. EXERCISE IDEAS AND TIPS Choose exercises that:  You enjoy.  Fit into your day. You do not need to exercise really hard to be healthy. You can do exercises at a slow or medium level and stay healthy. You can:  Stretch before and after working out.  Try yoga, Pilates, or tai chi.  Lift weights.  Walk fast, swim, jog, run, climb stairs, bicycle, dance, or rollerskate.  Take aerobic classes. Exercises that burn about 150 calories:  Running 1  miles in 15 minutes.  Playing volleyball for 45 to 60 minutes.  Washing and waxing a car for 45 to 60 minutes.  Playing touch football for 45 minutes.  Walking 1  miles in 35 minutes.  Pushing a stroller 1  miles in 30 minutes.  Playing basketball for 30 minutes.  Raking leaves for 30 minutes.  Bicycling 5 miles in 30 minutes.  Walking 2 miles in 30 minutes.  Dancing for 30 minutes.  Shoveling snow for 15 minutes.  Swimming laps for 20 minutes.  Walking up stairs for 15 minutes.  Bicycling 4 miles in 15 minutes.  Gardening for 30 to 45 minutes.  Jumping rope for 15 minutes.  Washing windows or floors for 45 to 60 minutes. Document Released: 05/10/2010 Document Revised: 06/30/2011 Document Reviewed: 05/10/2010 Exit[employer] Patient Information 2015 [employer]. This information is not intended to replace advice given to you by your health care provider. Make sure you discuss any questions you have with your health care provider.  

## 2014-06-15 NOTE — Progress Notes (Signed)
Tiffany Gallagher 28-Oct-1965 [id number]    History:    Presents for annual exam.  TVH for fibroids. Normal Pap and mammogram history. Smoker-half pack daily. Primary care manages hypertension. History of benign hematuria with negative workup. GERD head a benign polyp 2012. Has had problems with recurrent BV and yeast in the past.  Past medical history, past surgical history, family history and social history were all reviewed and documented in the EPIC chart. 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  ROS:  A ROS was performed and pertinent positives and negatives are included.  Exam:  Filed Vitals:   06/15/14 0800  BP: 126/80    General appearance:  Obese Thyroid:  Symmetrical, normal in size, without palpable masses or nodularity. Respiratory  Auscultation:  Clear without wheezing or rhonchi Cardiovascular  Auscultation:  Regular rate, without rubs, murmurs or gallops  Edema/varicosities:  Not grossly evident Abdominal  Soft,nontender, without masses, guarding or rebound.  Liver/spleen:  No organomegaly noted  Hernia:  None appreciated  Skin  Inspection:  Grossly normal   Breasts: Examined lying and sitting.     Right: Without masses, retractions, discharge or axillary adenopathy.     Left: Without masses, retractions, discharge or axillary adenopathy. Gentitourinary   Inguinal/mons:  Normal without inguinal adenopathy  External genitalia:  Normal  BUS/Urethra/Skene's glands:  Normal  Vagina:  Normal  Cervix:  And uterus absent  Adnexa/parametria:     Rt: Without masses or tenderness.   Lt: Without masses or tenderness.  Anus and perineum: Normal  Digital rectal exam: Normal sphincter tone without palpated masses or tenderness  Assessment/Plan:  50 y.o. SBF G1 P1 for annual exam.   TVH for fibroids with no menopausal symptoms IBS/GERD-gastroenterologist manages Smoker half pack daily Morbid obesity Hypertension-primary care manages labs and meds STD  screen HSV-1 history  Plan: Aware of hazards of smoking is trying to cut down. Reviewed importance of increased regular exercise, decreasing calories for weight loss. SBE's, continue annual screening mammogram, 3-D tomography reviewed and encouraged history of dense breasts. GC/Chlamydia urethral, HIV, hep B, C, RPR. Encouraged condoms until permanent partner. Valtrex 500 twice daily for 3-5 days as needed, prescription, proper use given and reviewed.    [place], 10:45 AM 06/15/2014

## 2014-06-16 LAB — GC/CHLAMYDIA PROBE AMP
CT Probe RNA: NEGATIVE
GC Probe RNA: NEGATIVE

## 2014-06-16 LAB — HIV ANTIBODY (ROUTINE TESTING W REFLEX): HIV 1&2 Ab, 4th Generation: NONREACTIVE

## 2014-06-16 LAB — URINALYSIS W MICROSCOPIC + REFLEX CULTURE
CASTS: NONE SEEN
CRYSTALS: NONE SEEN
Glucose, UA: NEGATIVE mg/dL
Ketones, ur: NEGATIVE mg/dL
LEUKOCYTES UA: NEGATIVE
Nitrite: NEGATIVE
PH: 5.5 (ref 5.0–8.0)
Protein, ur: 30 mg/dL — AB
UROBILINOGEN UA: 0.2 mg/dL (ref 0.0–1.0)

## 2014-06-16 LAB — HEPATITIS C ANTIBODY: HCV Ab: NEGATIVE

## 2014-06-16 LAB — HEPATITIS B SURFACE ANTIGEN: Hepatitis B Surface Ag: NEGATIVE

## 2014-06-16 LAB — RPR

## 2014-06-16 NOTE — Telephone Encounter (Signed)
PA approved 06/15/2014 through 09/07/2014. JG//CMA

## 2014-11-15 ENCOUNTER — Ambulatory Visit (INDEPENDENT_AMBULATORY_CARE_PROVIDER_SITE_OTHER): Payer: BLUE CROSS/BLUE SHIELD | Admitting: Physician Assistant

## 2014-11-15 ENCOUNTER — Encounter: Payer: Self-pay | Admitting: Physician Assistant

## 2014-11-15 VITALS — BP 132/78 | HR 88 | Temp 98.3°F | Ht 66.0 in | Wt 306.6 lb

## 2014-11-15 DIAGNOSIS — J00 Acute nasopharyngitis [common cold]: Secondary | ICD-10-CM

## 2014-11-15 DIAGNOSIS — J208 Acute bronchitis due to other specified organisms: Principal | ICD-10-CM

## 2014-11-15 DIAGNOSIS — B9689 Other specified bacterial agents as the cause of diseases classified elsewhere: Secondary | ICD-10-CM

## 2014-11-15 MED ORDER — BENZONATATE 200 MG PO CAPS: 200.0000 mg | ORAL_CAPSULE | Freq: Two times a day (BID) | ORAL | Status: DC | PRN

## 2014-11-15 MED ORDER — DOXYCYCLINE HYCLATE 100 MG PO CAPS: 100.0000 mg | ORAL_CAPSULE | Freq: Two times a day (BID) | ORAL | Status: DC

## 2014-11-15 MED ORDER — FLUCONAZOLE 150 MG PO TABS
150.0000 mg | ORAL_TABLET | Freq: Once | ORAL | Status: DC
Start: 2014-11-15 — End: 2015-02-12

## 2014-11-15 NOTE — Assessment & Plan Note (Signed)
Rx Doxycycline. Rx Tessalon. Increase fluids. Continue Mucinex and Claritin. Rest. Diflucan given in case of yeast infection from ABX. Follow-up if symptoms are not improving.

## 2014-11-15 NOTE — Progress Notes (Signed)
Pre visit review using our clinic review tool, if applicable. No additional management support is needed unless otherwise documented below in the visit note. 

## 2014-11-15 NOTE — Progress Notes (Signed)
Patient presents to clinic today c/o > 1 week of worsening chest/sinus congestion, cough that is now productive of mostly clear-sputum but now with color.  Is unsure of fever. Denies chest pain or SOB. Endorses chills/sweats. Denies recent travel or sick contact.  Past Medical History  Diagnosis Date  . GERD (gastroesophageal reflux disease)   . Hyperlipidemia   . Migraines   . Hypertension   . Smoker     HALF PPD  . Fibroid   . Anemia     Current Outpatient Prescriptions on File Prior to Visit  Medication Sig Dispense Refill  . baclofen (LIORESAL) 10 MG tablet Take 1 tablet (10 mg total) by mouth at bedtime as needed (muscle spasms). 30 each 1  . benazepril (LOTENSIN) 10 MG tablet TAKE 1 TABLET BY MOUTH DAILY 90 tablet 1  . hydrochlorothiazide (HYDRODIURIL) 25 MG tablet TAKE 1 TABLET BY MOUTH DAILY 90 tablet 1  . meloxicam (MOBIC) 15 MG tablet Take 1 tablet (15 mg total) by mouth daily. 30 tablet 0  . Multiple Vitamin (MULTIVITAMIN WITH MINERALS) TABS Take 1 tablet by mouth daily.    Marland Kitchen omeprazole (PRILOSEC) 20 MG capsule Take 1 capsule (20 mg total) by mouth daily. 90 capsule 3  . simethicone (MYLICON) 80 MG chewable tablet Chew 80 mg by mouth 3 (three) times daily as needed for flatulence.     . traMADol (ULTRAM) 50 MG tablet Take 1 or 2 every 8 hours as needed for pain 40 tablet 0  . valACYclovir (VALTREX) 500 MG tablet Take twice daily for 3-5 days 30 tablet 12   No current facility-administered medications on file prior to visit.    Allergies  Allergen Reactions  . Lipitor [Atorvastatin Calcium]     Nerves, muscle spasms, light stool    Family History  Problem Relation Age of Onset  . Coronary artery disease Mother   . Hypertension Mother   . Kidney disease Mother 26    dialysis  . Aneurysm Mother     brain  . Stroke Mother   . Heart disease Mother     MI  . Cancer Father 76    brain cancer  . Diabetes Sister   . Arthritis Brother   . Hypertension Brother     . Diabetes Brother   . Hypertension Brother     History   Social History  . Marital Status: Single    Spouse Name: N/A  . Number of Children: 1  . Years of Education: 17   Occupational History  . customer service     Social History Main Topics  . Smoking status: Current Every Day Smoker -- 0.50 packs/day for 30 years    Types: Cigarettes  . Smokeless tobacco: Never Used     Comment: cutting back  . Alcohol Use: 0.0 oz/week    0 Standard drinks or equivalent per week     Comment: rarely  . Drug Use: No  . Sexual Activity:    Partners: Male    Birth Control/ Protection: Surgical, Condom     Comment: declined insurance questions   Other Topics Concern  . None   Social History Narrative   Exercise-- no   Review of Systems - See HPI.  All other ROS are negative.  BP 132/78 mmHg  Pulse 88  Temp(Src) 98.3 F (36.8 C) (Oral)  Ht 5\' 6"  (1.676 m)  Wt 306 lb 9.6 oz (139.073 kg)  BMI 49.51 kg/m2  SpO2 98%  Physical Exam  Constitutional: She is oriented to person, place, and time and well-developed, well-nourished, and in no distress.  HENT:  Head: Normocephalic and atraumatic.  Cardiovascular: Normal rate, regular rhythm, normal heart sounds and intact distal pulses.   Pulmonary/Chest: Effort normal and breath sounds normal. No respiratory distress. She has no wheezes. She has no rales. She exhibits no tenderness.  Neurological: She is alert and oriented to person, place, and time.  Vitals reviewed.  Assessment/Plan: Acute bacterial bronchitis Rx Doxycycline. Rx Tessalon. Increase fluids. Continue Mucinex and Claritin. Rest. Diflucan given in case of yeast infection from ABX. Follow-up if symptoms are not improving.

## 2014-11-15 NOTE — Patient Instructions (Signed)
Please take antibiotic as directed. Stay well hydrated. Continue Claritin and Mucinex as directed. Take the Tessalon for cough. Place a humidifier in the bedroom.  I have sent in a diflucan to your pharmacy in case a vaginal yeast infection develops.  Call or return to clinic if symptoms are not resolving.

## 2015-02-02 ENCOUNTER — Other Ambulatory Visit: Payer: Self-pay

## 2015-02-02 DIAGNOSIS — Z1231 Encounter for screening mammogram for malignant neoplasm of breast: Secondary | ICD-10-CM

## 2015-02-09 ENCOUNTER — Telehealth: Payer: Self-pay | Admitting: Family Medicine

## 2015-02-09 ENCOUNTER — Ambulatory Visit (INDEPENDENT_AMBULATORY_CARE_PROVIDER_SITE_OTHER): Payer: BLUE CROSS/BLUE SHIELD | Admitting: Medical

## 2015-02-09 ENCOUNTER — Encounter (INDEPENDENT_AMBULATORY_CARE_PROVIDER_SITE_OTHER): Payer: Self-pay

## 2015-02-09 ENCOUNTER — Encounter: Payer: Self-pay | Admitting: Family Medicine

## 2015-02-09 ENCOUNTER — Encounter: Payer: Self-pay | Admitting: Medical

## 2015-02-09 VITALS — BP 126/82 | HR 77 | Temp 97.4°F | Ht 66.0 in | Wt 308.0 lb

## 2015-02-09 DIAGNOSIS — R319 Hematuria, unspecified: Secondary | ICD-10-CM

## 2015-02-09 DIAGNOSIS — Z0000 Encounter for general adult medical examination without abnormal findings: Secondary | ICD-10-CM

## 2015-02-09 DIAGNOSIS — Z113 Encounter for screening for infections with a predominantly sexual mode of transmission: Secondary | ICD-10-CM

## 2015-02-09 DIAGNOSIS — R0683 Snoring: Secondary | ICD-10-CM

## 2015-02-09 DIAGNOSIS — L309 Dermatitis, unspecified: Secondary | ICD-10-CM

## 2015-02-09 LAB — CBC WITH DIFFERENTIAL/PLATELET
BASOS ABS: 0 10*3/uL (ref 0.0–0.1)
Basophils Relative: 0.6 % (ref 0.0–3.0)
Eosinophils Absolute: 0.2 10*3/uL (ref 0.0–0.7)
Eosinophils Relative: 2.3 % (ref 0.0–5.0)
HCT: 39.8 % (ref 36.0–46.0)
Hemoglobin: 13 g/dL (ref 12.0–15.0)
LYMPHS ABS: 3 10*3/uL (ref 0.7–4.0)
Lymphocytes Relative: 41.1 % (ref 12.0–46.0)
MCHC: 32.7 g/dL (ref 30.0–36.0)
MCV: 94.2 fl (ref 78.0–100.0)
MONO ABS: 0.4 10*3/uL (ref 0.1–1.0)
Monocytes Relative: 6 % (ref 3.0–12.0)
NEUTROS ABS: 3.7 10*3/uL (ref 1.4–7.7)
NEUTROS PCT: 50 % (ref 43.0–77.0)
PLATELETS: 320 10*3/uL (ref 150.0–400.0)
RBC: 4.22 Mil/uL (ref 3.87–5.11)
RDW: 15.5 % (ref 11.5–15.5)
WBC: 7.4 10*3/uL (ref 4.0–10.5)

## 2015-02-09 LAB — COMPREHENSIVE METABOLIC PANEL
ALT: 16 U/L (ref 0–35)
AST: 16 U/L (ref 0–37)
Albumin: 3.9 g/dL (ref 3.5–5.2)
Alkaline Phosphatase: 56 U/L (ref 39–117)
BILIRUBIN TOTAL: 0.5 mg/dL (ref 0.2–1.2)
BUN: 11 mg/dL (ref 6–23)
CO2: 29 meq/L (ref 19–32)
CREATININE: 0.56 mg/dL (ref 0.40–1.20)
Calcium: 9.5 mg/dL (ref 8.4–10.5)
Chloride: 101 mEq/L (ref 96–112)
GFR: 147.88 mL/min (ref >60.00)
GLUCOSE: 91 mg/dL (ref 70–99)
Potassium: 3.6 mEq/L (ref 3.5–5.1)
Sodium: 138 mEq/L (ref 135–145)
TOTAL PROTEIN: 7.6 g/dL (ref 6.0–8.3)

## 2015-02-09 LAB — POCT URINALYSIS DIPSTICK
BILIRUBIN UA: NEGATIVE
GLUCOSE UA: NEGATIVE
KETONES UA: NEGATIVE
Leukocytes, UA: NEGATIVE
Nitrite, UA: NEGATIVE
Protein, UA: NEGATIVE
SPEC GRAV UA: 1.01
Urobilinogen, UA: 0.2
pH, UA: 6.5

## 2015-02-09 LAB — LIPID PANEL
CHOL/HDL RATIO: 4
Cholesterol: 229 mg/dL — ABNORMAL HIGH (ref 0–200)
HDL: 53.6 mg/dL (ref >39.00)
LDL Cholesterol: 155 mg/dL — ABNORMAL HIGH (ref 0–99)
NONHDL: 174.95
TRIGLYCERIDES: 102 mg/dL (ref 0.0–149.0)
VLDL: 20.4 mg/dL (ref 0.0–40.0)

## 2015-02-09 LAB — TSH: TSH: 2.23 u[IU]/mL (ref 0.35–4.50)

## 2015-02-09 NOTE — Progress Notes (Signed)
Subjective:    Patient ID: Tiffany Gallagher, female    DOB: Sep 25, 1965, 49 y.o.   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    Pt here for physical. She is fasting.  Pt declines flu vaccine. But offered if changes mind let us know.  Pt has mammogram already scheduled for March 05, 2015.  Pt states she has partial hysterectomy. Pt seen gyn every year told does not need pap smear.  Pt has weight concerns. Pt states one med caused her blood pressure to increase. Belvique was too expensive. Almost $600 dollars. Pt was advised to take orlistat in past. Never did use as instructed.(used briefly).    Review of Systems  Constitutional: Negative for fever, chills, diaphoresis, activity change and fatigue.  Respiratory: Negative for cough, chest tightness and shortness of breath.   Cardiovascular: Negative for chest pain, palpitations and leg swelling.  Gastrointestinal: Negative for nausea, vomiting and abdominal pain.  Genitourinary: Negative for dysuria, urgency, frequency, hematuria, flank pain and pelvic pain.  Musculoskeletal: Negative for back pain, neck pain and neck stiffness.  Skin: Negative for rash.  Neurological: Negative for dizziness, tremors, seizures, syncope, facial asymmetry, speech difficulty, weakness, light-headedness, numbness and headaches.  Psychiatric/Behavioral: Positive for sleep disturbance. Negative for behavioral problems, confusion and agitation. The patient is not nervous/anxious.        Pt thinks she snores and daytime somnolence. Waking up a lot at night over past month.     Past Medical History  Diagnosis Date  . GERD (gastroesophageal reflux disease)   . Hyperlipidemia   . Migraines   . Hypertension   . Smoker     HALF PPD  . Fibroid   . Anemia     Social History    Social History  . Marital Status: Single    Spouse Name: N/A  . Number of Children: 1  . Years of Education: 17   Occupational History  . customer service     Social History Main Topics  . Smoking status: Current Every Day Smoker -- 0.50 packs/day for 30 years    Types: Cigarettes  . Smokeless tobacco: Never Used     Comment: cutting back  . Alcohol Use: 0.0 oz/week    0 Standard drinks or equivalent per week     Comment: rarely  . Drug Use: No  . Sexual Activity:    Partners: Male    Birth Control/ Protection: Surgical, Condom     Comment: declined insurance questions   Other Topics Concern  . Not on file   Social History Narrative   Exercise-- no    Past Surgical History  Procedure Laterality Date  . Cesarean section  1995  . Vaginal hysterectomy  1998    partial    Family History  Problem Relation Age of Onset  . Coronary artery disease Mother   . Hypertension Mother   . Kidney disease Mother 63    dialysis  . Aneurysm Mother     brain  . Stroke Mother   . Heart disease Mother     MI  . Cancer Father 62    brain cancer  . Diabetes Sister   . Arthritis Brother   . Hypertension Brother   . Diabetes Brother   . Hypertension Brother  Allergies  Allergen Reactions  . Lipitor [Atorvastatin Calcium]     Nerves, muscle spasms, light stool    Current Outpatient Prescriptions on File Prior to Visit  Medication Sig Dispense Refill  . baclofen (LIORESAL) 10 MG tablet Take 1 tablet (10 mg total) by mouth at bedtime as needed (muscle spasms). 30 each 1  . benazepril (LOTENSIN) 10 MG tablet TAKE 1 TABLET BY MOUTH DAILY 90 tablet 1  . benzonatate (TESSALON) 200 MG capsule Take 1 capsule (200 mg total) by mouth 2 (two) times daily as needed for cough. 20 capsule 0  . doxycycline (VIBRAMYCIN) 100 MG capsule Take 1 capsule (100 mg total) by mouth 2 (two) times daily. 20 capsule 0  . fluconazole (DIFLUCAN) 150 MG tablet Take 1 tablet (150 mg total) by mouth  once. 1 tablet 0  . hydrochlorothiazide (HYDRODIURIL) 25 MG tablet TAKE 1 TABLET BY MOUTH DAILY 90 tablet 1  . meloxicam (MOBIC) 15 MG tablet Take 1 tablet (15 mg total) by mouth daily. 30 tablet 0  . Multiple Vitamin (MULTIVITAMIN WITH MINERALS) TABS Take 1 tablet by mouth daily.    Marland Kitchen omeprazole (PRILOSEC) 20 MG capsule Take 1 capsule (20 mg total) by mouth daily. 90 capsule 3  . simethicone (MYLICON) 80 MG chewable tablet Chew 80 mg by mouth 3 (three) times daily as needed for flatulence.     . traMADol (ULTRAM) 50 MG tablet Take 1 or 2 every 8 hours as needed for pain 40 tablet 0  . valACYclovir (VALTREX) 500 MG tablet Take twice daily for 3-5 days 30 tablet 12   No current facility-administered medications on file prior to visit.    BP 126/82 mmHg  Pulse 77  Temp(Src) 97.4 F (36.3 C) (Oral)  Ht 5\' 6"  (1.676 m)  Wt 308 lb (139.708 kg)  BMI 49.74 kg/m2  SpO2 98%       Objective:   Physical Exam   General   Mental Status- Alert.  Orientation-Oriented x3. Build and Nutrition Well Nourished and Well Developed.  Skin General: Normal.  Color- Normal color. Moisture- Dry.Temperature warm. Lesions: No suspicious lesions(mild hyrpigmented area base do neck).  Head, Eyes, Ears, Nose, Thoat Ears-Normal. Auditory Canal-Bilateral-Normal. Tympanic Membrane- Bilateral-Normal. Eyes Fundi- Bilateral-Normal. Pupil- Bilateral- Direct reaction to light normal. Nose & Sinuses- Normal. Nostril- Bilateral-Normal. Pharynx- pt has very large tonsils. Not much space in between. Uvula moderate sized.  Neck Neck- No Bruits or Masses. Thyroid- Normal. No thyromegaly or nodules.   Chest and Lung Exam  Percussion: Quality and Intensity:-Percussion normal. Percussion of chest reveals- No Dullness. Palpation of the chest reveals- Non-tender. Auscultation: Breath sounds-Normal. Adventitious  Sounds:No adventitious   Cardiovascular Inspection: No Heaves. Auscultation: Heart Sounds-  Normal sinus rhythm without murmur or gallop, S1 WNL and S2 WNL.  Abdomen Inspection:- Inspection Normal. Inspection of abdomen reveals- No Hernias. Palpation/Percussion: Palpation and Percussion of the abdomen reveal- Non Tender and No Palpable masses. Liver: Other Characteristics- No Hepatmegaly Spleen:Other Characteristics- No Splenomegaly. Auscultation: Auscultation of the abdomen reveals-Bowel sounds normal and No Abdominal bruits.    Neurologic Mental Status- Normal Cranial Nerves- Normal Bilaterally, Motor- Normal. Strength:5/5 normal muscle strength- All Muscles. . Coordination- Normal. Gait- Normal. Meningeal Signs- None.  Musculoskeletal Global Assessment General- Joints show full range of motion without obvious deformity and Normal muscle mass. Strength 5/5 in upper and lower extremities.  Lymphatic General lymphatics Description-No Generalized lymphadenopathy.          Assessment & Plan:  At end of exam pt  requested referral to dermatologist for eczema.

## 2015-02-09 NOTE — Patient Instructions (Addendum)
Wellness examination Cbc, cmp, tsh, lipid panel and ua.  Severe obesity (BMI >= 40) Can try orlistat otc and use three times daily You could use dose of 120 mg.(otc dose tabs should be 60 mg) Rx advisement on decreasing calories by 30%. Try weight watchers.   Advised on side effects fatty foods.   For your snoring will refer you for sleep study with pulmonologist. ENT referral may be needed as well based on physical exam pharynx findings.  Will refer you for sleep study.  Follow up 3 months or as needed  Preventive Care for Adults, Female A healthy lifestyle and preventive care can promote health and wellness. Preventive health guidelines for women include the following key practices.  A routine yearly physical is a good way to check with your health care provider about your health and preventive screening. It is a chance to share any concerns and updates on your health and to receive a thorough exam.  Visit your dentist for a routine exam and preventive care every 6 months. Brush your teeth twice a day and floss once a day. Good oral hygiene prevents tooth decay and gum disease.  The frequency of eye exams is based on your age, health, family medical history, use of contact lenses, and other factors. Follow your health care provider's recommendations for frequency of eye exams.  Eat a healthy diet. Foods like vegetables, fruits, whole grains, low-fat dairy products, and lean protein foods contain the nutrients you need without too many calories. Decrease your intake of foods high in solid fats, added sugars, and salt. Eat the right amount of calories for you.Get information about a proper diet from your health care provider, if necessary.  Regular physical exercise is one of the most important things you can do for your health. Most adults should get at least 150 minutes of moderate-intensity exercise (any activity that increases your heart rate and causes you to sweat) each week. In  addition, most adults need muscle-strengthening exercises on 2 or more days a week.  Maintain a healthy weight. The body mass index (BMI) is a screening tool to identify possible weight problems. It provides an estimate of body fat based on height and weight. Your health care provider can find your BMI and can help you achieve or maintain a healthy weight.For adults 20 years and older:  A BMI below 18.5 is considered underweight.  A BMI of 18.5 to 24.9 is normal.  A BMI of 25 to 29.9 is considered overweight.  A BMI of 30 and above is considered obese.  Maintain normal blood lipids and cholesterol levels by exercising and minimizing your intake of saturated fat. Eat a balanced diet with plenty of fruit and vegetables. Blood tests for lipids and cholesterol should begin at age 72 and be repeated every 5 years. If your lipid or cholesterol levels are high, you are over 50, or you are at high risk for heart disease, you may need your cholesterol levels checked more frequently.Ongoing high lipid and cholesterol levels should be treated with medicines if diet and exercise are not working.  If you smoke, find out from your health care provider how to quit. If you do not use tobacco, do not start.  Lung cancer screening is recommended for adults aged 30-80 years who are at high risk for developing lung cancer because of a history of smoking. A yearly low-dose CT scan of the lungs is recommended for people who have at least a 30-pack-year history of smoking  and are a current smoker or have quit within the past 15 years. A pack year of smoking is smoking an average of 1 pack of cigarettes a day for 1 year (for example: 1 pack a day for 30 years or 2 packs a day for 15 years). Yearly screening should continue until the smoker has stopped smoking for at least 15 years. Yearly screening should be stopped for people who develop a health problem that would prevent them from having lung cancer treatment.  If  you are pregnant, do not drink alcohol. If you are breastfeeding, be very cautious about drinking alcohol. If you are not pregnant and choose to drink alcohol, do not have more than 1 drink per day. One drink is considered to be 12 ounces (355 mL) of beer, 5 ounces (148 mL) of wine, or 1.5 ounces (44 mL) of liquor.  Avoid use of street drugs. Do not share needles with anyone. Ask for help if you need support or instructions about stopping the use of drugs.  High blood pressure causes heart disease and increases the risk of stroke. Your blood pressure should be checked at least every 1 to 2 years. Ongoing high blood pressure should be treated with medicines if weight loss and exercise do not work.  If you are 74-75 years old, ask your health care provider if you should take aspirin to prevent strokes.  Diabetes screening is done by taking a blood sample to check your blood glucose level after you have not eaten for a certain period of time (fasting). If you are not overweight and you do not have risk factors for diabetes, you should be screened once every 3 years starting at age 103. If you are overweight or obese and you are 80-68 years of age, you should be screened for diabetes every year as part of your cardiovascular risk assessment.  Breast cancer screening is essential preventive care for women. You should practice "breast self-awareness." This means understanding the normal appearance and feel of your breasts and may include breast self-examination. Any changes detected, no matter how small, should be reported to a health care provider. Women in their 72s and 30s should have a clinical breast exam (CBE) by a health care provider as part of a regular health exam every 1 to 3 years. After age 59, women should have a CBE every year. Starting at age 79, women should consider having a mammogram (breast X-ray test) every year. Women who have a family history of breast cancer should talk to their health  care provider about genetic screening. Women at a high risk of breast cancer should talk to their health care providers about having an MRI and a mammogram every year.  Breast cancer gene (BRCA)-related cancer risk assessment is recommended for women who have family members with BRCA-related cancers. BRCA-related cancers include breast, ovarian, tubal, and peritoneal cancers. Having family members with these cancers may be associated with an increased risk for harmful changes (mutations) in the breast cancer genes BRCA1 and BRCA2. Results of the assessment will determine the need for genetic counseling and BRCA1 and BRCA2 testing.  Your health care provider may recommend that you be screened regularly for cancer of the pelvic organs (ovaries, uterus, and vagina). This screening involves a pelvic examination, including checking for microscopic changes to the surface of your cervix (Pap test). You may be encouraged to have this screening done every 3 years, beginning at age 10.  For women ages 75-65, health care providers  may recommend pelvic exams and Pap testing every 3 years, or they may recommend the Pap and pelvic exam, combined with testing for human papilloma virus (HPV), every 5 years. Some types of HPV increase your risk of cervical cancer. Testing for HPV may also be done on women of any age with unclear Pap test results.  Other health care providers may not recommend any screening for nonpregnant women who are considered low risk for pelvic cancer and who do not have symptoms. Ask your health care provider if a screening pelvic exam is right for you.  If you have had past treatment for cervical cancer or a condition that could lead to cancer, you need Pap tests and screening for cancer for at least 20 years after your treatment. If Pap tests have been discontinued, your risk factors (such as having a new sexual partner) need to be reassessed to determine if screening should resume. Some women have  medical problems that increase the chance of getting cervical cancer. In these cases, your health care provider may recommend more frequent screening and Pap tests.  Colorectal cancer can be detected and often prevented. Most routine colorectal cancer screening begins at the age of 60 years and continues through age 42 years. However, your health care provider may recommend screening at an earlier age if you have risk factors for colon cancer. On a yearly basis, your health care provider may provide home test kits to check for hidden blood in the stool. Use of a small camera at the end of a tube, to directly examine the colon (sigmoidoscopy or colonoscopy), can detect the earliest forms of colorectal cancer. Talk to your health care provider about this at age 61, when routine screening begins. Direct exam of the colon should be repeated every 5-10 years through age 1 years, unless early forms of precancerous polyps or small growths are found.  People who are at an increased risk for hepatitis B should be screened for this virus. You are considered at high risk for hepatitis B if:  You were born in a country where hepatitis B occurs often. Talk with your health care provider about which countries are considered high risk.  Your parents were born in a high-risk country and you have not received a shot to protect against hepatitis B (hepatitis B vaccine).  You have HIV or AIDS.  You use needles to inject street drugs.  You live with, or have sex with, someone who has hepatitis B.  You get hemodialysis treatment.  You take certain medicines for conditions like cancer, organ transplantation, and autoimmune conditions.  Hepatitis C blood testing is recommended for all people born from [birth year] through 1965 and any individual with known risks for hepatitis C.  Practice safe sex. Use condoms and avoid high-risk sexual practices to reduce the spread of sexually transmitted infections (STIs). STIs include  gonorrhea, chlamydia, syphilis, trichomonas, herpes, HPV, and human immunodeficiency virus (HIV). Herpes, HIV, and HPV are viral illnesses that have no cure. They can result in disability, cancer, and death.  You should be screened for sexually transmitted illnesses (STIs) including gonorrhea and chlamydia if:  You are sexually active and are younger than 24 years.  You are older than 24 years and your health care provider tells you that you are at risk for this type of infection.  Your sexual activity has changed since you were last screened and you are at an increased risk for chlamydia or gonorrhea. Ask your health care provider if  you are at risk.  If you are at risk of being infected with HIV, it is recommended that you take a prescription medicine daily to prevent HIV infection. This is called preexposure prophylaxis (PrEP). You are considered at risk if:  You are sexually active and do not regularly use condoms or know the HIV status of your partner(s).  You take drugs by injection.  You are sexually active with a partner who has HIV.  Talk with your health care provider about whether you are at high risk of being infected with HIV. If you choose to begin PrEP, you should first be tested for HIV. You should then be tested every 3 months for as long as you are taking PrEP.  Osteoporosis is a disease in which the bones lose minerals and strength with aging. This can result in serious bone fractures or breaks. The risk of osteoporosis can be identified using a bone density scan. Women ages 1 years and over and women at risk for fractures or osteoporosis should discuss screening with their health care providers. Ask your health care provider whether you should take a calcium supplement or vitamin D to reduce the rate of osteoporosis.  Menopause can be associated with physical symptoms and risks. Hormone replacement therapy is available to decrease symptoms and risks. You should talk to your  health care provider about whether hormone replacement therapy is right for you.  Use sunscreen. Apply sunscreen liberally and repeatedly throughout the day. You should seek shade when your shadow is shorter than you. Protect yourself by wearing long sleeves, pants, a wide-brimmed hat, and sunglasses year round, whenever you are outdoors.  Once a month, do a whole body skin exam, using a mirror to look at the skin on your back. Tell your health care provider of new moles, moles that have irregular borders, moles that are larger than a pencil eraser, or moles that have changed in shape or color.  Stay current with required vaccines (immunizations).  Influenza vaccine. All adults should be immunized every year.  Tetanus, diphtheria, and acellular pertussis (Td, Tdap) vaccine. Pregnant women should receive 1 dose of Tdap vaccine during each pregnancy. The dose should be obtained regardless of the length of time since the last dose. Immunization is preferred during the 27th-36th week of gestation. An adult who has not previously received Tdap or who does not know her vaccine status should receive 1 dose of Tdap. This initial dose should be followed by tetanus and diphtheria toxoids (Td) booster doses every 10 years. Adults with an unknown or incomplete history of completing a 3-dose immunization series with Td-containing vaccines should begin or complete a primary immunization series including a Tdap dose. Adults should receive a Td booster every 10 years.  Varicella vaccine. An adult without evidence of immunity to varicella should receive 2 doses or a second dose if she has previously received 1 dose. Pregnant females who do not have evidence of immunity should receive the first dose after pregnancy. This first dose should be obtained before leaving the health care facility. The second dose should be obtained 4-8 weeks after the first dose.  Human papillomavirus (HPV) vaccine. Females aged 13-26 years  who have not received the vaccine previously should obtain the 3-dose series. The vaccine is not recommended for use in pregnant females. However, pregnancy testing is not needed before receiving a dose. If a female is found to be pregnant after receiving a dose, no treatment is needed. In that case, the remaining  doses should be delayed until after the pregnancy. Immunization is recommended for any person with an immunocompromised condition through the age of 71 years if she did not get any or all doses earlier. During the 3-dose series, the second dose should be obtained 4-8 weeks after the first dose. The third dose should be obtained 24 weeks after the first dose and 16 weeks after the second dose.  Zoster vaccine. One dose is recommended for adults aged 87 years or older unless certain conditions are present.  Measles, mumps, and rubella (MMR) vaccine. Adults born before [birth year] generally are considered immune to measles and mumps. Adults born in [birth year] or later should have 1 or more doses of MMR vaccine unless there is a contraindication to the vaccine or there is laboratory evidence of immunity to each of the three diseases. A routine second dose of MMR vaccine should be obtained at least 28 days after the first dose for students attending postsecondary schools, health care workers, or international travelers. People who received inactivated measles vaccine or an unknown type of measles vaccine during 1963-1967 should receive 2 doses of MMR vaccine. People who received inactivated mumps vaccine or an unknown type of mumps vaccine before 1979 and are at high risk for mumps infection should consider immunization with 2 doses of MMR vaccine. For females of childbearing age, rubella immunity should be determined. If there is no evidence of immunity, females who are not pregnant should be vaccinated. If there is no evidence of immunity, females who are pregnant should delay immunization until after pregnancy.  Unvaccinated health care workers born before [birth year] who lack laboratory evidence of measles, mumps, or rubella immunity or laboratory confirmation of disease should consider measles and mumps immunization with 2 doses of MMR vaccine or rubella immunization with 1 dose of MMR vaccine.  Pneumococcal 13-valent conjugate (PCV13) vaccine. When indicated, a person who is uncertain of his immunization history and has no record of immunization should receive the PCV13 vaccine. All adults 49 years of age and older should receive this vaccine. An adult aged 40 years or older who has certain medical conditions and has not been previously immunized should receive 1 dose of PCV13 vaccine. This PCV13 should be followed with a dose of pneumococcal polysaccharide (PPSV23) vaccine. Adults who are at high risk for pneumococcal disease should obtain the PPSV23 vaccine at least 8 weeks after the dose of PCV13 vaccine. Adults older than 49 years of age who have normal immune system function should obtain the PPSV23 vaccine dose at least 1 year after the dose of PCV13 vaccine.  Pneumococcal polysaccharide (PPSV23) vaccine. When PCV13 is also indicated, PCV13 should be obtained first. All adults aged 31 years and older should be immunized. An adult younger than age 25 years who has certain medical conditions should be immunized. Any person who resides in a nursing home or long-term care facility should be immunized. An adult smoker should be immunized. People with an immunocompromised condition and certain other conditions should receive both PCV13 and PPSV23 vaccines. People with human immunodeficiency virus (HIV) infection should be immunized as soon as possible after diagnosis. Immunization during chemotherapy or radiation therapy should be avoided. Routine use of PPSV23 vaccine is not recommended for American Indians, [place] Natives, or people younger than 65 years unless there are medical conditions that require PPSV23 vaccine.  When indicated, people who have unknown immunization and have no record of immunization should receive PPSV23 vaccine. One-time revaccination 5 years after the first dose of  PPSV23 is recommended for people aged 19-64 years who have chronic kidney failure, nephrotic syndrome, asplenia, or immunocompromised conditions. People who received 1-2 doses of PPSV23 before age 72 years should receive another dose of PPSV23 vaccine at age 57 years or later if at least 5 years have passed since the previous dose. Doses of PPSV23 are not needed for people immunized with PPSV23 at or after age 15 years.  Meningococcal vaccine. Adults with asplenia or persistent complement component deficiencies should receive 2 doses of quadrivalent meningococcal conjugate (MenACWY-D) vaccine. The doses should be obtained at least 2 months apart. Microbiologists working with certain meningococcal bacteria, military recruits, people at risk during an outbreak, and people who travel to or live in countries with a high rate of meningitis should be immunized. A first-year college student up through age 4 years who is living in a residence hall should receive a dose if she did not receive a dose on or after her 16th birthday. Adults who have certain high-risk conditions should receive one or more doses of vaccine.  Hepatitis A vaccine. Adults who wish to be protected from this disease, have certain high-risk conditions, work with hepatitis A-infected animals, work in hepatitis A research labs, or travel to or work in countries with a high rate of hepatitis A should be immunized. Adults who were previously unvaccinated and who anticipate close contact with an international adoptee during the first 60 days after arrival in the [place] from a country with a high rate of hepatitis A should be immunized.  Hepatitis B vaccine. Adults who wish to be protected from this disease, have certain high-risk conditions, may be exposed to blood or other  infectious body fluids, are household contacts or sex partners of hepatitis B positive people, are clients or workers in certain care facilities, or travel to or work in countries with a high rate of hepatitis B should be immunized.  Haemophilus influenzae type b (Hib) vaccine. A previously unvaccinated person with asplenia or sickle cell disease or having a scheduled splenectomy should receive 1 dose of Hib vaccine. Regardless of previous immunization, a recipient of a hematopoietic stem cell transplant should receive a 3-dose series 6-12 months after her successful transplant. Hib vaccine is not recommended for adults with HIV infection. Preventive Services / Frequency Ages 37 to 39 years  Blood pressure check.** / Every 3-5 years.  Lipid and cholesterol check.** / Every 5 years beginning at age 49.  Clinical breast exam.** / Every 3 years for women in their 34s and 30s.  BRCA-related cancer risk assessment.** / For women who have family members with a BRCA-related cancer (breast, ovarian, tubal, or peritoneal cancers).  Pap test.** / Every 2 years from ages 45 through 36. Every 3 years starting at age 46 through age 29 or 65 with a history of 3 consecutive normal Pap tests.  HPV screening.** / Every 3 years from ages 42 through ages 75 to 63 with a history of 3 consecutive normal Pap tests.  Hepatitis C blood test.** / For any individual with known risks for hepatitis C.  Skin self-exam. / Monthly.  Influenza vaccine. / Every year.  Tetanus, diphtheria, and acellular pertussis (Tdap, Td) vaccine.** / Consult your health care provider. Pregnant women should receive 1 dose of Tdap vaccine during each pregnancy. 1 dose of Td every 10 years.  Varicella vaccine.** / Consult your health care provider. Pregnant females who do not have evidence of immunity should receive the first dose after pregnancy.  HPV vaccine. / 3 doses over 6 months, if 31 and younger. The vaccine is not recommended for  use in pregnant females. However, pregnancy testing is not needed before receiving a dose.  Measles, mumps, rubella (MMR) vaccine.** / You need at least 1 dose of MMR if you were born in 1957 or later. You may also need a 2nd dose. For females of childbearing age, rubella immunity should be determined. If there is no evidence of immunity, females who are not pregnant should be vaccinated. If there is no evidence of immunity, females who are pregnant should delay immunization until after pregnancy.  Pneumococcal 13-valent conjugate (PCV13) vaccine.** / Consult your health care provider.  Pneumococcal polysaccharide (PPSV23) vaccine.** / 1 to 2 doses if you smoke cigarettes or if you have certain conditions.  Meningococcal vaccine.** / 1 dose if you are age 19 to 30 years and a Market researcher living in a residence hall, or have one of several medical conditions, you need to get vaccinated against meningococcal disease. You may also need additional booster doses.  Hepatitis A vaccine.** / Consult your health care provider.  Hepatitis B vaccine.** / Consult your health care provider.  Haemophilus influenzae type b (Hib) vaccine.** / Consult your health care provider. Ages 24 to 42 years  Blood pressure check.** / Every year.  Lipid and cholesterol check.** / Every 5 years beginning at age 66 years.  Lung cancer screening. / Every year if you are aged 30-80 years and have a 30-pack-year history of smoking and currently smoke or have quit within the past 15 years. Yearly screening is stopped once you have quit smoking for at least 15 years or develop a health problem that would prevent you from having lung cancer treatment.  Clinical breast exam.** / Every year after age 67 years.  BRCA-related cancer risk assessment.** / For women who have family members with a BRCA-related cancer (breast, ovarian, tubal, or peritoneal cancers).  Mammogram.** / Every year beginning at age 52 years  and continuing for as long as you are in good health. Consult with your health care provider.  Pap test.** / Every 3 years starting at age 34 years through age 33 or 64 years with a history of 3 consecutive normal Pap tests.  HPV screening.** / Every 3 years from ages 75 years through ages 39 to 22 years with a history of 3 consecutive normal Pap tests.  Fecal occult blood test (FOBT) of stool. / Every year beginning at age 50 years and continuing until age 97 years. You may not need to do this test if you get a colonoscopy every 10 years.  Flexible sigmoidoscopy or colonoscopy.** / Every 5 years for a flexible sigmoidoscopy or every 10 years for a colonoscopy beginning at age 77 years and continuing until age 64 years.  Hepatitis C blood test.** / For all people born from [birth year] through 1965 and any individual with known risks for hepatitis C.  Skin self-exam. / Monthly.  Influenza vaccine. / Every year.  Tetanus, diphtheria, and acellular pertussis (Tdap/Td) vaccine.** / Consult your health care provider. Pregnant women should receive 1 dose of Tdap vaccine during each pregnancy. 1 dose of Td every 10 years.  Varicella vaccine.** / Consult your health care provider. Pregnant females who do not have evidence of immunity should receive the first dose after pregnancy.  Zoster vaccine.** / 1 dose for adults aged 63 years or older.  Measles, mumps, rubella (MMR) vaccine.** / You need at least  1 dose of MMR if you were born in 1957 or later. You may also need a second dose. For females of childbearing age, rubella immunity should be determined. If there is no evidence of immunity, females who are not pregnant should be vaccinated. If there is no evidence of immunity, females who are pregnant should delay immunization until after pregnancy.  Pneumococcal 13-valent conjugate (PCV13) vaccine.** / Consult your health care provider.  Pneumococcal polysaccharide (PPSV23) vaccine.** / 1 to 2 doses if  you smoke cigarettes or if you have certain conditions.  Meningococcal vaccine.** / Consult your health care provider.  Hepatitis A vaccine.** / Consult your health care provider.  Hepatitis B vaccine.** / Consult your health care provider.  Haemophilus influenzae type b (Hib) vaccine.** / Consult your health care provider. Ages 28 years and over  Blood pressure check.** / Every year.  Lipid and cholesterol check.** / Every 5 years beginning at age 51 years.  Lung cancer screening. / Every year if you are aged 17-80 years and have a 30-pack-year history of smoking and currently smoke or have quit within the past 15 years. Yearly screening is stopped once you have quit smoking for at least 15 years or develop a health problem that would prevent you from having lung cancer treatment.  Clinical breast exam.** / Every year after age 10 years.  BRCA-related cancer risk assessment.** / For women who have family members with a BRCA-related cancer (breast, ovarian, tubal, or peritoneal cancers).  Mammogram.** / Every year beginning at age 58 years and continuing for as long as you are in good health. Consult with your health care provider.  Pap test.** / Every 3 years starting at age 61 years through age 72 or 26 years with 3 consecutive normal Pap tests. Testing can be stopped between 65 and 70 years with 3 consecutive normal Pap tests and no abnormal Pap or HPV tests in the past 10 years.  HPV screening.** / Every 3 years from ages 65 years through ages 56 or 72 years with a history of 3 consecutive normal Pap tests. Testing can be stopped between 65 and 70 years with 3 consecutive normal Pap tests and no abnormal Pap or HPV tests in the past 10 years.  Fecal occult blood test (FOBT) of stool. / Every year beginning at age 36 years and continuing until age 32 years. You may not need to do this test if you get a colonoscopy every 10 years.  Flexible sigmoidoscopy or colonoscopy.** / Every 5  years for a flexible sigmoidoscopy or every 10 years for a colonoscopy beginning at age 39 years and continuing until age 32 years.  Hepatitis C blood test.** / For all people born from [birth year] through 1965 and any individual with known risks for hepatitis C.  Osteoporosis screening.** / A one-time screening for women ages 63 years and over and women at risk for fractures or osteoporosis.  Skin self-exam. / Monthly.  Influenza vaccine. / Every year.  Tetanus, diphtheria, and acellular pertussis (Tdap/Td) vaccine.** / 1 dose of Td every 10 years.  Varicella vaccine.** / Consult your health care provider.  Zoster vaccine.** / 1 dose for adults aged 75 years or older.  Pneumococcal 13-valent conjugate (PCV13) vaccine.** / Consult your health care provider.  Pneumococcal polysaccharide (PPSV23) vaccine.** / 1 dose for all adults aged 60 years and older.  Meningococcal vaccine.** / Consult your health care provider.  Hepatitis A vaccine.** / Consult your health care provider.  Hepatitis B vaccine.** /  Consult your health care provider.  Haemophilus influenzae type b (Hib) vaccine.** / Consult your health care provider. ** Family history and personal history of risk and conditions may change your health care provider's recommendations.   This information is not intended to replace advice given to you by your health care provider. Make sure you discuss any questions you have with your health care provider.   Document Released: 06/03/2001 Document Revised: 04/28/2014 Document Reviewed: 09/02/2010 Elsevier Interactive Patient Education [employer].

## 2015-02-09 NOTE — Telephone Encounter (Signed)
I will cancel those orders.

## 2015-02-09 NOTE — Telephone Encounter (Signed)
Pt does not want HIV or Hep C screening run for her. Please cancel the orders for those 2. She only wants typical annual lab work, CBC, TSH, Lipids, etc.

## 2015-02-09 NOTE — Assessment & Plan Note (Signed)
Cbc, cmp, tsh, lipid panel and ua.

## 2015-02-09 NOTE — Progress Notes (Signed)
Pre visit review using our clinic review tool, if applicable. No additional management support is needed unless otherwise documented below in the visit note. 

## 2015-02-09 NOTE — Assessment & Plan Note (Signed)
Can try orlistat otc and use three times daily You could use dose of 120 mg.(otc dose tabs should be 60 mg) Rx advisement on decreasing calories by 30%. Try weight watchers.   Advised on side effects fatty foods.

## 2015-02-09 NOTE — Addendum Note (Signed)
Addended by: Tasia Catchings on: 02/09/2015 11:45 AM   Modules accepted: Orders

## 2015-02-09 NOTE — Addendum Note (Signed)
Addended by: Tasia Catchings on: 02/09/2015 11:44 AM   Modules accepted: Orders

## 2015-02-10 LAB — URINE CULTURE
COLONY COUNT: NO GROWTH
Organism ID, Bacteria: NO GROWTH

## 2015-02-12 ENCOUNTER — Ambulatory Visit (INDEPENDENT_AMBULATORY_CARE_PROVIDER_SITE_OTHER): Payer: BLUE CROSS/BLUE SHIELD

## 2015-02-12 ENCOUNTER — Encounter: Payer: Self-pay | Admitting: Family

## 2015-02-12 ENCOUNTER — Ambulatory Visit (INDEPENDENT_AMBULATORY_CARE_PROVIDER_SITE_OTHER): Payer: BLUE CROSS/BLUE SHIELD | Admitting: Family Medicine

## 2015-02-12 VITALS — BP 148/92 | HR 89 | Temp 99.0°F | Resp 18 | Ht 66.0 in | Wt 307.0 lb

## 2015-02-12 DIAGNOSIS — S46912A Strain of unspecified muscle, fascia and tendon at shoulder and upper arm level, left arm, initial encounter: Secondary | ICD-10-CM

## 2015-02-12 DIAGNOSIS — S40012A Contusion of left shoulder, initial encounter: Secondary | ICD-10-CM | POA: Diagnosis not present

## 2015-02-12 DIAGNOSIS — M25511 Pain in right shoulder: Secondary | ICD-10-CM | POA: Diagnosis not present

## 2015-02-12 DIAGNOSIS — T148XXA Other injury of unspecified body region, initial encounter: Secondary | ICD-10-CM

## 2015-02-12 DIAGNOSIS — S161XXA Strain of muscle, fascia and tendon at neck level, initial encounter: Secondary | ICD-10-CM

## 2015-02-12 MED ORDER — CYCLOBENZAPRINE HCL 10 MG PO TABS: 10.0000 mg | ORAL_TABLET | Freq: Two times a day (BID) | ORAL | Status: DC | PRN

## 2015-02-12 MED ORDER — MELOXICAM 15 MG PO TABS: 15.0000 mg | ORAL_TABLET | Freq: Every day | ORAL | Status: DC
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
## 2015-02-12 NOTE — Patient Instructions (Signed)
Use heat and gentle range of motion to help with stiffness in your neck and shoulder Your x-rays looked ok Use the mobic as needed, the flexeril as needed (will make you sleepy) Let us know if you are not improved in the next 2-3 days; remember that tomorrow you will probably be even more sore However it does not appear that anything is broken -I think that you have a neck and shoulder strain

## 2015-03-02 ENCOUNTER — Encounter: Payer: BLUE CROSS/BLUE SHIELD | Admitting: Family Medicine

## 2015-03-05 ENCOUNTER — Ambulatory Visit
Admission: RE | Admit: 2015-03-05 | Discharge: 2015-03-05 | Disposition: A | Discharge status: Home / Self Care | Payer: BLUE CROSS/BLUE SHIELD | Source: Ambulatory Visit

## 2015-03-05 DIAGNOSIS — Z1231 Encounter for screening mammogram for malignant neoplasm of breast: Secondary | ICD-10-CM

## 2015-03-06 ENCOUNTER — Encounter: Payer: Self-pay | Admitting: Pulmonary Disease

## 2015-03-06 ENCOUNTER — Ambulatory Visit (INDEPENDENT_AMBULATORY_CARE_PROVIDER_SITE_OTHER): Payer: BLUE CROSS/BLUE SHIELD | Admitting: Pulmonary Disease

## 2015-03-06 VITALS — BP 122/88 | HR 94 | Ht 66.0 in | Wt 303.8 lb

## 2015-03-06 DIAGNOSIS — R400 Somnolence: Secondary | ICD-10-CM

## 2015-03-06 DIAGNOSIS — R0683 Snoring: Secondary | ICD-10-CM | POA: Diagnosis not present

## 2015-03-06 DIAGNOSIS — G4710 Hypersomnia, unspecified: Secondary | ICD-10-CM | POA: Diagnosis not present

## 2015-03-06 NOTE — Patient Instructions (Signed)
We will schedule you for a split-night sleep study.  Follow-up in the sleep clinic in 1-2 months.

## 2015-03-06 NOTE — Progress Notes (Addendum)
Subjective:    Patient ID: Tiffany Gallagher, female    DOB: 1966-01-25, 49 y.o.   MRN: [id number]  HPI Evaluation for sleep apnea.  Mrs. Smathers is a 49 year old referred for evaluation of OSA. She reports significant snoring at home with periods of witnessed apnea and daytime somnolence.  Epworth Sleepiness Scale [03/06/15] is 22.  She reports that she goes to bed between 10:50 PM.  It takes her just 1-2 minutes to fall asleep. She does not use any medications or supplements to help her sleep. She wakes 3-6 times in the night because of her acid reflux and the need to use bathroom.  She usually wakes up in the morning at 6:54 AM. Exline she reports feeling tired and still sleepy on with her. She does not use an alarm to wake up. She does not use any stimulants to keep her awake during the day.  She snores heavily at night. Her son has reported that she has periods of apnea. She does wake up at times feeling that she can't breathe at that her throat is closing. Mouth gets dry when she sleeps.  She talks during her sleep but does not walk. She grinds her teeth occasionally while sleeping.  She's never acted out her dreams while sleeping. She does not get frequent dreams or malaise.  She does not have sleep paralysis, hypogogic/hypnopomi hallucinations or cataplexy.  She reports that she gets funny feelings in her legs at night which get better when she moves. These feelings prevent her from falling asleep or staying asleep.  Past Medical History  Diagnosis Date  . GERD (gastroesophageal reflux disease)   . Hyperlipidemia   . Migraines   . Hypertension   . Smoker     HALF PPD  . Fibroid   . Anemia   . Allergy     Current outpatient prescriptions:  .  benazepril (LOTENSIN) 10 MG tablet, TAKE 1 TABLET BY MOUTH DAILY, Disp: 90 tablet, Rfl: 1 .  cyclobenzaprine (FLEXERIL) 10 MG tablet, Take 1 tablet (10 mg total) by mouth 2 (two) times daily as needed for muscle spasms.,  Disp: 30 tablet, Rfl: 0 .  hydrochlorothiazide (HYDRODIURIL) 25 MG tablet, TAKE 1 TABLET BY MOUTH DAILY, Disp: 90 tablet, Rfl: 1 .  meloxicam (MOBIC) 15 MG tablet, Take 1 tablet (15 mg total) by mouth daily., Disp: 30 tablet, Rfl: 0 .  Multiple Vitamin (MULTIVITAMIN WITH MINERALS) TABS, Take 1 tablet by mouth daily., Disp: , Rfl:  .  omeprazole (PRILOSEC) 20 MG capsule, Take 1 capsule (20 mg total) by mouth daily., Disp: 90 capsule, Rfl: 3 .  simethicone (MYLICON) 80 MG chewable tablet, Chew 80 mg by mouth 3 (three) times daily as needed for flatulence. , Disp: , Rfl:  .  valACYclovir (VALTREX) 500 MG tablet, Take twice daily for 3-5 days, Disp: 30 tablet, Rfl: 12   Review of Systems Reports daytime somnolence, snoring at night. Denies any cough, sputum production, dyspnea, wheezing, hemoptysis. Denies any chest pain, palpitations. Denies any nausea, vomiting, diarrhea, constipation. All other review of systems negative.    Objective:   Physical Exam Blood pressure 122/88, pulse 94, height 5\' 6"  (1.676 m), weight 303 lb 12.8 oz (137.803 kg), SpO2 97 %.  Gen.: Obese, No apparent distress Neuro: No gross focal deficits. Neck: No JVD, lymphadenopathy, thyromegaly. RS: Clear, No wheeze or crackles. Nonlabored breathing. CVS: S1-S2 heard, no murmurs rubs gallops. Abdomen: Soft, positive bowel sounds. Extremities: No edema.    Assessment &  Plan:  Evaluation for OSA.  From her symptoms it appears that she has severe apnea. She may have restless leg syndrome as well.  I will order a split night study. Return to clinic in 1-2 months after initiation of CPAP to evaluate response to therapy. Due to the complexity of her presentation and severe symptoms she will need to follow in the sleep clinic soon after her sleep study  Marshell Garfinkel MD [facility] Pager [phone] If no answer or after 3pm call: [phone] 03/06/2015, 3:57 PM

## 2015-03-07 ENCOUNTER — Other Ambulatory Visit: Payer: Self-pay | Admitting: Women's Health

## 2015-03-07 DIAGNOSIS — R928 Other abnormal and inconclusive findings on diagnostic imaging of breast: Secondary | ICD-10-CM

## 2015-03-14 ENCOUNTER — Ambulatory Visit
Admission: RE | Admit: 2015-03-14 | Discharge: 2015-03-14 | Disposition: A | Discharge status: Home / Self Care | Payer: BLUE CROSS/BLUE SHIELD | Source: Ambulatory Visit | Attending: Women's Health | Admitting: Women's Health

## 2015-03-14 ENCOUNTER — Encounter: Payer: Self-pay | Admitting: Women's Health

## 2015-03-14 DIAGNOSIS — R928 Other abnormal and inconclusive findings on diagnostic imaging of breast: Secondary | ICD-10-CM

## 2015-03-19 ENCOUNTER — Encounter (HOSPITAL_BASED_OUTPATIENT_CLINIC_OR_DEPARTMENT_OTHER): Payer: Self-pay

## 2015-03-19 ENCOUNTER — Emergency Department (HOSPITAL_BASED_OUTPATIENT_CLINIC_OR_DEPARTMENT_OTHER): Payer: BLUE CROSS/BLUE SHIELD

## 2015-03-19 ENCOUNTER — Emergency Department (HOSPITAL_BASED_OUTPATIENT_CLINIC_OR_DEPARTMENT_OTHER)
Admission: EM | Admit: 2015-03-19 | Discharge: 2015-03-19 | Disposition: A | Discharge status: Home / Self Care | Payer: BLUE CROSS/BLUE SHIELD | Attending: Emergency Medicine | Admitting: Emergency Medicine

## 2015-03-19 DIAGNOSIS — F17210 Nicotine dependence, cigarettes, uncomplicated: Secondary | ICD-10-CM | POA: Insufficient documentation

## 2015-03-19 DIAGNOSIS — Z79899 Other long term (current) drug therapy: Secondary | ICD-10-CM | POA: Insufficient documentation

## 2015-03-19 DIAGNOSIS — M791 Myalgia: Secondary | ICD-10-CM | POA: Diagnosis not present

## 2015-03-19 DIAGNOSIS — J069 Acute upper respiratory infection, unspecified: Secondary | ICD-10-CM | POA: Diagnosis not present

## 2015-03-19 DIAGNOSIS — I10 Essential (primary) hypertension: Secondary | ICD-10-CM | POA: Insufficient documentation

## 2015-03-19 DIAGNOSIS — Z862 Personal history of diseases of the blood and blood-forming organs and certain disorders involving the immune mechanism: Secondary | ICD-10-CM | POA: Diagnosis not present

## 2015-03-19 DIAGNOSIS — G43909 Migraine, unspecified, not intractable, without status migrainosus: Secondary | ICD-10-CM | POA: Insufficient documentation

## 2015-03-19 DIAGNOSIS — Z86018 Personal history of other benign neoplasm: Secondary | ICD-10-CM | POA: Insufficient documentation

## 2015-03-19 DIAGNOSIS — K219 Gastro-esophageal reflux disease without esophagitis: Secondary | ICD-10-CM | POA: Diagnosis not present

## 2015-03-19 DIAGNOSIS — Z8639 Personal history of other endocrine, nutritional and metabolic disease: Secondary | ICD-10-CM | POA: Insufficient documentation

## 2015-03-19 DIAGNOSIS — R05 Cough: Secondary | ICD-10-CM | POA: Diagnosis present

## 2015-03-19 MED ORDER — IBUPROFEN 800 MG PO TABS
800.0000 mg | ORAL_TABLET | Freq: Once | ORAL | Status: AC
  Administered 2015-03-19: 800 mg via ORAL
  Filled 2015-03-19: qty 1

## 2015-03-19 MED ORDER — BENZONATATE 100 MG PO CAPS: 100.0000 mg | ORAL_CAPSULE | Freq: Three times a day (TID) | ORAL | Status: DC

## 2015-03-19 MED ORDER — BENZONATATE 100 MG PO CAPS
100.0000 mg | ORAL_CAPSULE | Freq: Once | ORAL | Status: AC
  Administered 2015-03-19: 100 mg via ORAL
  Filled 2015-03-19: qty 1

## 2015-03-19 MED ORDER — ACETAMINOPHEN 500 MG PO TABS
1000.0000 mg | ORAL_TABLET | Freq: Once | ORAL | Status: AC
  Administered 2015-03-19: 1000 mg via ORAL
  Filled 2015-03-19: qty 2

## 2015-03-19 NOTE — Discharge Instructions (Signed)

## 2015-03-19 NOTE — ED Provider Notes (Signed)
CSN: [id number]     Arrival date & time 03/19/15  1149 History   First MD Initiated Contact with Patient 03/19/15 1211     Chief Complaint  Patient presents with  . Cough     (Consider location/radiation/quality/duration/timing/severity/associated sxs/prior Treatment) Patient is a 49 y.o. female presenting with general illness. The history is provided by the patient.  Illness Severity:  Moderate Onset quality:  Gradual Duration:  2 days Timing:  Constant Progression:  Worsening Chronicity:  New Associated symptoms: congestion, cough, fever and myalgias   Associated symptoms: no chest pain, no headaches, no nausea, no rhinorrhea, no shortness of breath, no vomiting and no wheezing     49 yo F with a chief complaint of cough congestion fevers and body aches. This been going on since yesterday. Patient denies any sick contacts. Patient has not gotten the flu shot this year. Patient denies vomiting denies diarrhea. Has tried some over-the-counter medicines without relief. Complaining of some chest pain with cough.  Past Medical History  Diagnosis Date  . GERD (gastroesophageal reflux disease)   . Hyperlipidemia   . Migraines   . Hypertension   . Smoker     HALF PPD  . Fibroid   . Anemia   . Allergy    Past Surgical History  Procedure Laterality Date  . Cesarean section  1995  . Vaginal hysterectomy  1998    partial   Family History  Problem Relation Age of Onset  . Coronary artery disease Mother   . Hypertension Mother   . Kidney disease Mother 53    dialysis  . Aneurysm Mother     brain  . Stroke Mother   . Heart disease Mother     MI  . Cancer Father 33    brain cancer  . Diabetes Sister   . Hyperlipidemia Sister   . Hypertension Sister   . Arthritis Brother   . Hypertension Brother   . Diabetes Brother   . Hypertension Brother   . Hypertension Sister    Social History  Substance Use Topics  . Smoking status: Current Every Day Smoker -- 0.50 packs/day  for 30 years    Types: Cigarettes  . Smokeless tobacco: Never Used     Comment: cutting back  . Alcohol Use: 0.0 oz/week    0 Standard drinks or equivalent per week     Comment: rarely   OB History    Gravida Para Term Preterm AB TAB SAB Ectopic Multiple Living   2 1 1  1     1      Review of Systems  Constitutional: Positive for fever. Negative for chills.  HENT: Positive for congestion. Negative for rhinorrhea.   Eyes: Negative for redness and visual disturbance.  Respiratory: Positive for cough. Negative for shortness of breath and wheezing.   Cardiovascular: Negative for chest pain and palpitations.  Gastrointestinal: Negative for nausea and vomiting.  Genitourinary: Negative for dysuria and urgency.  Musculoskeletal: Positive for myalgias. Negative for arthralgias.  Skin: Negative for pallor and wound.  Neurological: Negative for dizziness and headaches.      Allergies  Lipitor  Home Medications   Prior to Admission medications   Medication Sig Start Date End Date Taking? Authorizing Provider  benazepril (LOTENSIN) 10 MG tablet TAKE 1 TABLET BY MOUTH DAILY 05/26/14   Rosalita Chessman, DO  benzonatate (TESSALON) 100 MG capsule Take 1 capsule (100 mg total) by mouth every 8 (eight) hours. 03/19/15   Deno Etienne, DO  cyclobenzaprine (FLEXERIL) 10 MG tablet Take 1 tablet (10 mg total) by mouth 2 (two) times daily as needed for muscle spasms. 02/12/15   Gay Filler Copland, MD  hydrochlorothiazide (HYDRODIURIL) 25 MG tablet TAKE 1 TABLET BY MOUTH DAILY 05/26/14   Rosalita Chessman, DO  meloxicam (MOBIC) 15 MG tablet Take 1 tablet (15 mg total) by mouth daily. 02/12/15   Darreld Mclean, MD  Multiple Vitamin (MULTIVITAMIN WITH MINERALS) TABS Take 1 tablet by mouth daily.    Historical Provider, MD  omeprazole (PRILOSEC) 20 MG capsule Take 1 capsule (20 mg total) by mouth daily. 02/10/14 06/11/15  Percell Miller Saguier, PA-C  simethicone (MYLICON) 80 MG chewable tablet Chew 80 mg by mouth 3  (three) times daily as needed for flatulence.     Historical Provider, MD  valACYclovir (VALTREX) 500 MG tablet Take twice daily for 3-5 days 06/15/14   Huel Cote, NP   BP 156/113 mmHg  Pulse 93  Temp(Src) 99.3 F (37.4 C) (Oral)  Resp 22  Ht 5\' 4"  (1.626 m)  Wt 300 lb (136.079 kg)  BMI 51.47 kg/m2  SpO2 97% Physical Exam  Constitutional: She is oriented to person, place, and time. She appears well-developed and well-nourished. No distress.  HENT:  Head: Normocephalic and atraumatic.  Swollen turbinates  Eyes: EOM are normal. Pupils are equal, round, and reactive to light.  Neck: Normal range of motion. Neck supple.  Cardiovascular: Normal rate and regular rhythm.  Exam reveals no gallop and no friction rub.   No murmur heard. Pulmonary/Chest: Effort normal. She has no wheezes. She has no rales.  Abdominal: Soft. She exhibits no distension. There is no tenderness. There is no rebound and no guarding.  Musculoskeletal: She exhibits no edema or tenderness.  Neurological: She is alert and oriented to person, place, and time.  Skin: Skin is warm and dry. She is not diaphoretic.  Psychiatric: She has a normal mood and affect. Her behavior is normal.  Nursing note and vitals reviewed.   ED Course  Procedures (including critical care time) Labs Review Labs Reviewed - No data to display  Imaging Review Dg Chest 2 View  03/19/2015  CLINICAL DATA:  Cough, congestion, fatigue for 1 day. EXAM: CHEST  2 VIEW COMPARISON:  08/22/2012. FINDINGS: Trachea is midline. Heart size normal. Lungs are clear. No pleural fluid. IMPRESSION: No acute findings. Electronically Signed   By: Lorin Picket M.D.   On: 03/19/2015 13:05   I have personally reviewed and evaluated these images and lab results as part of my medical decision-making.   EKG Interpretation None      MDM   Final diagnoses:  URI (upper respiratory infection)    49 yo F with a chief complaint of cough fever or myalgias.  Flulike illness patient with no significant risk factors requiring Tamiflu. Chest x-ray negative for pneumonia. Given Tessalon with improvement on the ED. We'll have her take Tylenol and Motrin Tessalon prescription.   I have discussed the diagnosis/risks/treatment options with the patient and family and believe the pt to be eligible for discharge home to follow-up with PCP. We also discussed returning to the ED immediately if new or worsening sx occur. We discussed the sx which are most concerning (e.g., sudden worsening sob) that necessitate immediate return. Medications administered to the patient during their visit and any new prescriptions provided to the patient are listed below.  Medications given during this visit Medications  acetaminophen (TYLENOL) tablet 1,000 mg (1,000 mg Oral Given  03/19/15 1217)  ibuprofen (ADVIL,MOTRIN) tablet 800 mg (800 mg Oral Given 03/19/15 1217)  benzonatate (TESSALON) capsule 100 mg (100 mg Oral Given 03/19/15 1311)    Discharge Medication List as of 03/19/2015  1:10 PM    START taking these medications   Details  benzonatate (TESSALON) 100 MG capsule Take 1 capsule (100 mg total) by mouth every 8 (eight) hours., Starting 03/19/2015, Until Discontinued, Print        The patient appears reasonably screen and/or stabilized for discharge and I doubt any other medical condition or other [facility] requiring further screening, evaluation, or treatment in the ED at this time prior to discharge.      Deno Etienne, DO 03/19/15 [phone]

## 2015-03-19 NOTE — ED Notes (Signed)
Prod cough, body aches x 2 days

## 2015-03-28 ENCOUNTER — Ambulatory Visit (HOSPITAL_BASED_OUTPATIENT_CLINIC_OR_DEPARTMENT_OTHER): Payer: BLUE CROSS/BLUE SHIELD | Attending: Pulmonary Disease | Admitting: Radiology

## 2015-03-28 DIAGNOSIS — G4733 Obstructive sleep apnea (adult) (pediatric): Secondary | ICD-10-CM | POA: Diagnosis not present

## 2015-03-28 DIAGNOSIS — R400 Somnolence: Secondary | ICD-10-CM | POA: Diagnosis present

## 2015-03-28 DIAGNOSIS — R0683 Snoring: Secondary | ICD-10-CM | POA: Insufficient documentation

## 2015-03-28 DIAGNOSIS — E669 Obesity, unspecified: Secondary | ICD-10-CM | POA: Diagnosis not present

## 2015-03-28 DIAGNOSIS — Z6841 Body Mass Index (BMI) 40.0 and over, adult: Secondary | ICD-10-CM | POA: Diagnosis not present

## 2015-03-30 DIAGNOSIS — G4733 Obstructive sleep apnea (adult) (pediatric): Secondary | ICD-10-CM | POA: Diagnosis not present

## 2015-03-30 NOTE — Progress Notes (Signed)
Patient Name: Tiffany Gallagher, Tiffany Gallagher Date: 03/28/2015 Gender: Female D.O.B: 1965/08/09 Age (years): 2 Referring Provider: Marshell Garfinkel Height (inches): 64 Interpreting Physician: Kara Mead MD, ABSM Weight (lbs): 300 RPSGT: Laren Everts BMI: 51 MRN: [id number] Neck Size: 17.00   CLINICAL INFORMATION Sleep Study Type: Split Night CPAP Indication for sleep study: Snoring, obesity, non refreshing sleep Epworth Sleepiness Score:11   SLEEP STUDY TECHNIQUE As per the AASM Manual for the Scoring of Sleep and Associated Events v2.3 (April 2016) with a hypopnea requiring 4% desaturations. The channels recorded and monitored were frontal, central and occipital EEG, electrooculogram (EOG), submentalis EMG (chin), nasal and oral airflow, thoracic and abdominal wall motion, anterior tibialis EMG, snore microphone, electrocardiogram, and pulse oximetry. Continuous positive airway pressure (CPAP) was initiated when the patient met split night criteria and was titrated according to treat sleep-disordered breathing.   RESPIRATORY PARAMETERS Diagnostic Total AHI (/hr): 151.4 RDI (/hr): 151.4 OA Index (/hr): 147.1 CA Index (/hr): 0.0 REM AHI (/hr): 145.7 NREM AHI (/hr): 152.1 Supine AHI (/hr): 160.0 Non-supine AHI (/hr): 151.22 Min O2 Sat (%): 69.00 Mean O2 (%): 89.61 Time below 88% (min): 48.7   Titration Optimal Pressure (cm): 17 AHI at Optimal Pressure (/hr): 5.6 Min O2 at Optimal Pressure (%): 93.0 Supine % at Optimal (%): 0 Sleep % at Optimal (%): 99     SLEEP ARCHITECTURE The recording time for the entire night was 463.5 minutes. During a baseline period of 192.9 minutes, the patient slept for 126.0 minutes in REM and nonREM, yielding a sleep efficiency of 65.3%. Sleep onset after lights out was 6.4 minutes with a REM latency of 159.5 minutes. The patient spent 55.95% of the night in stage N1 sleep, 32.94% in stage N2 sleep, 0.00% in stage N3 and 11.11% in REM. During the titration  period of 262.1 minutes, the patient slept for 256.0 minutes in REM and nonREM, yielding a sleep efficiency of 97.7%. Sleep onset after CPAP initiation was 0.9 minutes with a REM latency of 65.0 minutes. The patient spent 3.91% of the night in stage N1 sleep, 48.83% in stage N2 sleep, 0.00% in stage N3 and 47.27% in REM.   CARDIAC DATA The 2 lead EKG demonstrated sinus rhythm. The mean heart rate was 81.90 beats per minute. Other EKG findings include: None.   LEG MOVEMENT DATA The total Periodic Limb Movements of Sleep (PLMS) were 0. The PLMS index was 0.00 .   IMPRESSIONS - Severe obstructive sleep apnea occurred during the diagnostic portion of the study (AHI = 151.4/hour). An optimal PAP pressure was selected for this patient ( 17 cm of water) - No significant central sleep apnea occurred during the diagnostic portion of the study (CAI = 0.0/hour). - Moderate oxygen desaturation was noted during the diagnostic portion of the study (Min O2 =69.00%). - The patient snored with Moderate snoring volume during the diagnostic portion of the study. - No cardiac abnormalities were noted during this study. - Clinically significant periodic limb movements did not occur during sleep.   DIAGNOSIS - Obstructive Sleep Apnea (327.23 [G47.33 ICD-10])   RECOMMENDATIONS - Trial of CPAP therapy on 17 cm H2O with a Small size Fisher&Paykel Full Face Mask Simplus mask and heated humidification. - Alternatively auto CPAP 12- 18 cm can be used - Avoid alcohol, sedatives and other CNS depressants that may worsen sleep apnea and disrupt normal sleep architecture. - Sleep hygiene should be reviewed to assess factors that may improve sleep quality. - Weight management and regular exercise should be  initiated or continued. - Return  for re-evaluation after 4 weeks of therapy  Kara Mead MD. FCCP. [facility]

## 2015-04-03 ENCOUNTER — Telehealth: Payer: Self-pay | Admitting: Pulmonary Disease

## 2015-04-03 DIAGNOSIS — G4733 Obstructive sleep apnea (adult) (pediatric): Secondary | ICD-10-CM

## 2015-04-03 NOTE — Telephone Encounter (Signed)
Called and spoke with pt Pt requesting results of sleep study that was done 04/02/2015 Informed pt that PM has not resulted the study yet, but once he does the office will call back with results Pt stated that she would like this done urgently in case CPAP needs to be ordered Pt is wishing for CPAP to be ordered by the end of the year  Dr Vaughan Browner, please advise. Thanks

## 2015-04-04 ENCOUNTER — Other Ambulatory Visit: Payer: Self-pay | Admitting: Family Medicine

## 2015-04-04 NOTE — Telephone Encounter (Signed)
CPAP of 17 ordered. We will call and let her know.

## 2015-04-05 ENCOUNTER — Telehealth: Payer: Self-pay | Admitting: Pulmonary Disease

## 2015-04-05 DIAGNOSIS — G4730 Sleep apnea, unspecified: Secondary | ICD-10-CM

## 2015-04-05 NOTE — Addendum Note (Signed)
Addended by: Parke Poisson E on: 04/05/2015 05:32 PM   Modules accepted: Orders

## 2015-04-05 NOTE — Telephone Encounter (Signed)
Tiffany Garfinkel, MD at 04/04/2015 3:02 PM     Status: Signed       Expand All Collapse All   CPAP of 17 ordered. We will call and let her know.              Levander Campion, LPN at [phone] [phone] PM     Status: Signed       Expand All Collapse All   Called and spoke with pt Pt requesting results of sleep study that was done 04/02/2015 Informed pt that PM has not resulted the study yet, but once he does the office will call back with results Pt stated that she would like this done urgently in case CPAP needs to be ordered Pt is wishing for CPAP to be ordered by the end of the year  Dr Vaughan Browner, please advise. Thanks          Called and spoke to pt. Pt requesting the results of the sleep study. Informed her of the recs per PM. Order placed. Pt is requesting she get the CPAP prior to the end of the year due to her already meeting her deductible.   PCC's please advise. Thanks.

## 2015-04-05 NOTE — Telephone Encounter (Signed)
Per PM: Message     Please order the CPAP of 17 as reccomended.   Order placed, will wait for pt to return call to let her know

## 2015-04-05 NOTE — Telephone Encounter (Signed)
LMTCB x1 for pt.  

## 2015-04-06 NOTE — Telephone Encounter (Signed)
I called & spoke to pt yesterday. Order was faxed to [facility] yesterday. Gave pt their phone # so she can call them if they do not contact her within the next few days. Nothing further needed.

## 2015-04-24 ENCOUNTER — Telehealth: Payer: Self-pay | Admitting: Pulmonary Disease

## 2015-04-24 NOTE — Telephone Encounter (Signed)
Pt calling for sleep study results.  She is aware CPAP was needed/ordered and [facility] is still processing the order.  However, pt wanted to know more detailed results and requests the sleep study be mailed to her.  Home address verified Sleep study place in the mail Nothing further needed; will sign off

## 2015-05-17 ENCOUNTER — Ambulatory Visit (HOSPITAL_BASED_OUTPATIENT_CLINIC_OR_DEPARTMENT_OTHER): Payer: BLUE CROSS/BLUE SHIELD

## 2015-06-19 ENCOUNTER — Ambulatory Visit (INDEPENDENT_AMBULATORY_CARE_PROVIDER_SITE_OTHER): Payer: BLUE CROSS/BLUE SHIELD | Admitting: Women's Health

## 2015-06-19 ENCOUNTER — Encounter: Payer: Self-pay | Admitting: Women's Health

## 2015-06-19 VITALS — BP 132/78 | Ht 66.0 in | Wt 311.0 lb

## 2015-06-19 DIAGNOSIS — Z720 Tobacco use: Secondary | ICD-10-CM

## 2015-06-19 DIAGNOSIS — N760 Acute vaginitis: Secondary | ICD-10-CM

## 2015-06-19 DIAGNOSIS — Z01419 Encounter for gynecological examination (general) (routine) without abnormal findings: Secondary | ICD-10-CM | POA: Diagnosis not present

## 2015-06-19 DIAGNOSIS — Z113 Encounter for screening for infections with a predominantly sexual mode of transmission: Secondary | ICD-10-CM

## 2015-06-19 DIAGNOSIS — B9689 Other specified bacterial agents as the cause of diseases classified elsewhere: Secondary | ICD-10-CM

## 2015-06-19 DIAGNOSIS — A499 Bacterial infection, unspecified: Secondary | ICD-10-CM

## 2015-06-19 DIAGNOSIS — B009 Herpesviral infection, unspecified: Secondary | ICD-10-CM

## 2015-06-19 DIAGNOSIS — B3731 Acute candidiasis of vulva and vagina: Secondary | ICD-10-CM

## 2015-06-19 DIAGNOSIS — F17200 Nicotine dependence, unspecified, uncomplicated: Secondary | ICD-10-CM

## 2015-06-19 DIAGNOSIS — B373 Candidiasis of vulva and vagina: Secondary | ICD-10-CM | POA: Diagnosis not present

## 2015-06-19 LAB — URINALYSIS W MICROSCOPIC + REFLEX CULTURE
BILIRUBIN URINE: NEGATIVE
CASTS: NONE SEEN [LPF]
Glucose, UA: NEGATIVE
Ketones, ur: NEGATIVE
Leukocytes, UA: NEGATIVE
NITRITE: NEGATIVE
YEAST: NONE SEEN [HPF]
pH: 5.5 (ref 5.0–8.0)

## 2015-06-19 LAB — WET PREP FOR TRICH, YEAST, CLUE
TRICH WET PREP: NONE SEEN
YEAST WET PREP: NONE SEEN

## 2015-06-19 MED ORDER — VALACYCLOVIR HCL 500 MG PO TABS: ORAL_TABLET | ORAL | Status: DC

## 2015-06-19 MED ORDER — METRONIDAZOLE 500 MG PO TABS: 500.0000 mg | ORAL_TABLET | Freq: Two times a day (BID) | ORAL | Status: DC

## 2015-06-19 MED ORDER — FLUCONAZOLE 150 MG PO TABS: 150.0000 mg | ORAL_TABLET | Freq: Once | ORAL | Status: DC

## 2015-06-19 NOTE — Patient Instructions (Signed)
Health Maintenance, Female Adopting a healthy lifestyle and getting preventive care can go a long way to promote health and wellness. Talk with your health care provider about what schedule of regular examinations is right for you. This is a good chance for you to check in with your provider about disease prevention and staying healthy. In between checkups, there are plenty of things you can do on your own. Experts have done a lot of research about which lifestyle changes and preventive measures are most likely to keep you healthy. Ask your health care provider for more information. WEIGHT AND DIET Diabetes Mellitus and Food It is important for you to manage your blood sugar (glucose) level. Your blood glucose level can be greatly affected by what you eat. Eating healthier foods in the appropriate amounts throughout the day at about the same time each day will help you control your blood glucose level. It can also help slow or prevent worsening of your diabetes mellitus. Healthy eating may even help you improve the level of your blood pressure and reach or maintain a healthy weight.  General recommendations for healthful eating and cooking habits include:  Eating meals and snacks regularly. Avoid going long periods of time without eating to lose weight.  Eating a diet that consists mainly of plant-based foods, such as fruits, vegetables, nuts, legumes, and whole grains.  Using low-heat cooking methods, such as baking, instead of high-heat cooking methods, such as deep frying. Work with your dietitian to make sure you understand how to use the Nutrition Facts information on food labels. HOW CAN FOOD AFFECT ME? Carbohydrates Carbohydrates affect your blood glucose level more than any other type of food. Your dietitian will help you determine how many carbohydrates to eat at each meal and teach you how to count carbohydrates. Counting carbohydrates is important to keep your blood glucose at a healthy  level, especially if you are using insulin or taking certain medicines for diabetes mellitus. Alcohol Alcohol can cause sudden decreases in blood glucose (hypoglycemia), especially if you use insulin or take certain medicines for diabetes mellitus. Hypoglycemia can be a life-threatening condition. Symptoms of hypoglycemia (sleepiness, dizziness, and disorientation) are similar to symptoms of having too much alcohol.  If your health care provider has given you approval to drink alcohol, do so in moderation and use the following guidelines:  Women should not have more than one drink per day, and men should not have more than two drinks per day. One drink is equal to:  12 oz of beer.  5 oz of wine.  1 oz of hard liquor.  Do not drink on an empty stomach.  Keep yourself hydrated. Have water, diet soda, or unsweetened iced tea.  Regular soda, juice, and other mixers might contain a lot of carbohydrates and should be counted. WHAT FOODS ARE NOT RECOMMENDED? As you make food choices, it is important to remember that all foods are not the same. Some foods have fewer nutrients per serving than other foods, even though they might have the same number of calories or carbohydrates. It is difficult to get your body what it needs when you eat foods with fewer nutrients. Examples of foods that you should avoid that are high in calories and carbohydrates but low in nutrients include:  Trans fats (most processed foods list trans fats on the Nutrition Facts label).  Regular soda.  Juice.  Candy.  Sweets, such as cake, pie, doughnuts, and cookies.  Fried foods. WHAT FOODS CAN I EAT?  Eat nutrient-rich foods, which will nourish your body and keep you healthy. The food you should eat also will depend on several factors, including:  The calories you need.  The medicines you take.  Your weight.  Your blood glucose level.  Your blood pressure level.  Your cholesterol level. You should eat a  variety of foods, including:  Protein.  Lean cuts of meat.  Proteins low in saturated fats, such as fish, egg whites, and beans. Avoid processed meats.  Fruits and vegetables.  Fruits and vegetables that may help control blood glucose levels, such as apples, mangoes, and yams.  Dairy products.  Choose fat-free or low-fat dairy products, such as milk, yogurt, and cheese.  Grains, bread, pasta, and rice.  Choose whole grain products, such as multigrain bread, whole oats, and brown rice. These foods may help control blood pressure.  Fats.  Foods containing healthful fats, such as nuts, avocado, olive oil, canola oil, and fish. DOES EVERYONE WITH DIABETES MELLITUS HAVE THE SAME MEAL PLAN? Because every person with diabetes mellitus is different, there is not one meal plan that works for everyone. It is very important that you meet with a dietitian who will help you create a meal plan that is just right for you.   This information is not intended to replace advice given to you by your health care provider. Make sure you discuss any questions you have with your health care provider.   Document Released: 01/02/2005 Document Revised: 04/28/2014 Document Reviewed: 03/04/2013 Elsevier Interactive Patient Education 2016 [place] a healthy diet  Be sure to include plenty of vegetables, fruits, low-fat dairy products, and lean protein.  Do not eat a lot of foods high in solid fats, added sugars, or salt.  Get regular exercise. This is one of the most important things you can do for your health.  Most adults should exercise for at least 150 minutes each week. The exercise should increase your heart rate and make you sweat (moderate-intensity exercise).  Most adults should also do strengthening exercises at least twice a week. This is in addition to the moderate-intensity exercise.  Maintain a healthy weight  Body mass index (BMI) is a measurement that can be used to identify  possible weight problems. It estimates body fat based on height and weight. Your health care provider can help determine your BMI and help you achieve or maintain a healthy weight.  For females 37 years of age and older:   A BMI below 18.5 is considered underweight.  A BMI of 18.5 to 24.9 is normal.  A BMI of 25 to 29.9 is considered overweight.  A BMI of 30 and above is considered obese.  Watch levels of cholesterol and blood lipids  You should start having your blood tested for lipids and cholesterol at 50 years of age, then have this test every 5 years.  You may need to have your cholesterol levels checked more often if:  Your lipid or cholesterol levels are high.  You are older than 50 years of age.  You are at high risk for heart disease.  CANCER SCREENING   Lung Cancer  Lung cancer screening is recommended for adults 36-78 years old who are at high risk for lung cancer because of a history of smoking.  A yearly low-dose CT scan of the lungs is recommended for people who:  Currently smoke.  Have quit within the past 15 years.  Have at least a 30-pack-year history of smoking. A  pack year is smoking an average of one pack of cigarettes a day for 1 year.  Yearly screening should continue until it has been 15 years since you quit.  Yearly screening should stop if you develop a health problem that would prevent you from having lung cancer treatment.  Breast Cancer  Practice breast self-awareness. This means understanding how your breasts normally appear and feel.  It also means doing regular breast self-exams. Let your health care provider know about any changes, no matter how small.  If you are in your 20s or 30s, you should have a clinical breast exam (CBE) by a health care provider every 1-3 years as part of a regular health exam.  If you are 28 or older, have a CBE every year. Also consider having a breast X-ray (mammogram) every year.  If you have a family  history of breast cancer, talk to your health care provider about genetic screening.  If you are at high risk for breast cancer, talk to your health care provider about having an MRI and a mammogram every year.  Breast cancer gene (BRCA) assessment is recommended for women who have family members with BRCA-related cancers. BRCA-related cancers include:  Breast.  Ovarian.  Tubal.  Peritoneal cancers.  Results of the assessment will determine the need for genetic counseling and BRCA1 and BRCA2 testing. Cervical Cancer Your health care provider may recommend that you be screened regularly for cancer of the pelvic organs (ovaries, uterus, and vagina). This screening involves a pelvic examination, including checking for microscopic changes to the surface of your cervix (Pap test). You may be encouraged to have this screening done every 3 years, beginning at age 2.  For women ages 88-65, health care providers may recommend pelvic exams and Pap testing every 3 years, or they may recommend the Pap and pelvic exam, combined with testing for human papilloma virus (HPV), every 5 years. Some types of HPV increase your risk of cervical cancer. Testing for HPV may also be done on women of any age with unclear Pap test results.  Other health care providers may not recommend any screening for nonpregnant women who are considered low risk for pelvic cancer and who do not have symptoms. Ask your health care provider if a screening pelvic exam is right for you.  If you have had past treatment for cervical cancer or a condition that could lead to cancer, you need Pap tests and screening for cancer for at least 20 years after your treatment. If Pap tests have been discontinued, your risk factors (such as having a new sexual partner) need to be reassessed to determine if screening should resume. Some women have medical problems that increase the chance of getting cervical cancer. In these cases, your health care  provider may recommend more frequent screening and Pap tests. Colorectal Cancer  This type of cancer can be detected and often prevented.  Routine colorectal cancer screening usually begins at 50 years of age and continues through 50 years of age.  Your health care provider may recommend screening at an earlier age if you have risk factors for colon cancer.  Your health care provider may also recommend using home test kits to check for hidden blood in the stool.  A small camera at the end of a tube can be used to examine your colon directly (sigmoidoscopy or colonoscopy). This is done to check for the earliest forms of colorectal cancer.  Routine screening usually begins at age 33.  Direct  examination of the colon should be repeated every 5-10 years through 50 years of age. However, you may need to be screened more often if early forms of precancerous polyps or small growths are found. Skin Cancer  Check your skin from head to toe regularly.  Tell your health care provider about any new moles or changes in moles, especially if there is a change in a mole's shape or color.  Also tell your health care provider if you have a mole that is larger than the size of a pencil eraser.  Always use sunscreen. Apply sunscreen liberally and repeatedly throughout the day.  Protect yourself by wearing long sleeves, pants, a wide-brimmed hat, and sunglasses whenever you are outside. HEART DISEASE, DIABETES, AND HIGH BLOOD PRESSURE   High blood pressure causes heart disease and increases the risk of stroke. High blood pressure is more likely to develop in:  People who have blood pressure in the high end of the normal range (130-139/85-89 mm Hg).  People who are overweight or obese.  People who are African American.  If you are 2-50 years of age, have your blood pressure checked every 3-5 years. If you are 61 years of age or older, have your blood pressure checked every year. You should have your  blood pressure measured twice--once when you are at a hospital or clinic, and once when you are not at a hospital or clinic. Record the average of the two measurements. To check your blood pressure when you are not at a hospital or clinic, you can use:  An automated blood pressure machine at a pharmacy.  A home blood pressure monitor.  If you are between 64 years and 25 years old, ask your health care provider if you should take aspirin to prevent strokes.  Have regular diabetes screenings. This involves taking a blood sample to check your fasting blood sugar level.  If you are at a normal weight and have a low risk for diabetes, have this test once every three years after 50 years of age.  If you are overweight and have a high risk for diabetes, consider being tested at a younger age or more often. PREVENTING INFECTION  Hepatitis B  If you have a higher risk for hepatitis B, you should be screened for this virus. You are considered at high risk for hepatitis B if:  You were born in a country where hepatitis B is common. Ask your health care provider which countries are considered high risk.  Your parents were born in a high-risk country, and you have not been immunized against hepatitis B (hepatitis B vaccine).  You have HIV or AIDS.  You use needles to inject street drugs.  You live with someone who has hepatitis B.  You have had sex with someone who has hepatitis B.  You get hemodialysis treatment.  You take certain medicines for conditions, including cancer, organ transplantation, and autoimmune conditions. Hepatitis C  Blood testing is recommended for:  Everyone born from [birth year] through 1965.  Anyone with known risk factors for hepatitis C. Sexually transmitted infections (STIs)  You should be screened for sexually transmitted infections (STIs) including gonorrhea and chlamydia if:  You are sexually active and are younger than 50 years of age.  You are older than 50  years of age and your health care provider tells you that you are at risk for this type of infection.  Your sexual activity has changed since you were last screened and you are at  an increased risk for chlamydia or gonorrhea. Ask your health care provider if you are at risk.  If you do not have HIV, but are at risk, it may be recommended that you take a prescription medicine daily to prevent HIV infection. This is called pre-exposure prophylaxis (PrEP). You are considered at risk if:  You are sexually active and do not regularly use condoms or know the HIV status of your partner(s).  You take drugs by injection.  You are sexually active with a partner who has HIV. Talk with your health care provider about whether you are at high risk of being infected with HIV. If you choose to begin PrEP, you should first be tested for HIV. You should then be tested every 3 months for as long as you are taking PrEP.  PREGNANCY   If you are premenopausal and you may become pregnant, ask your health care provider about preconception counseling.  If you may become pregnant, take 400 to 800 micrograms (mcg) of folic acid every day.  If you want to prevent pregnancy, talk to your health care provider about birth control (contraception). OSTEOPOROSIS AND MENOPAUSE   Osteoporosis is a disease in which the bones lose minerals and strength with aging. This can result in serious bone fractures. Your risk for osteoporosis can be identified using a bone density scan.  If you are 94 years of age or older, or if you are at risk for osteoporosis and fractures, ask your health care provider if you should be screened.  Ask your health care provider whether you should take a calcium or vitamin D supplement to lower your risk for osteoporosis.  Menopause may have certain physical symptoms and risks.  Hormone replacement therapy may reduce some of these symptoms and risks. Talk to your health care provider about whether  hormone replacement therapy is right for you.  HOME CARE INSTRUCTIONS   Schedule regular health, dental, and eye exams.  Stay current with your immunizations.   Do not use any tobacco products including cigarettes, chewing tobacco, or electronic cigarettes.  If you are pregnant, do not drink alcohol.  If you are breastfeeding, limit how much and how often you drink alcohol.  Limit alcohol intake to no more than 1 drink per day for nonpregnant women. One drink equals 12 ounces of beer, 5 ounces of wine, or 1 ounces of hard liquor.  Do not use street drugs.  Do not share needles.  Ask your health care provider for help if you need support or information about quitting drugs.  Tell your health care provider if you often feel depressed.  Tell your health care provider if you have ever been abused or do not feel safe at home.   This information is not intended to replace advice given to you by your health care provider. Make sure you discuss any questions you have with your health care provider.   Document Released: 10/21/2010 Document Revised: 04/28/2014 Document Reviewed: 03/09/2013 Elsevier Interactive Patient Education [employer].

## 2015-06-19 NOTE — Progress Notes (Signed)
Tiffany Gallagher 1965/05/22 [id number]    History:    Presents for annual exam.  TVH for fibroids on no HRT with no menopausal symptoms other than an occasional hot flash. Sexually active new partner in the past year. Smoker less than half a pack a day. Hypertension primary care manages. History of normal Paps and mammograms last mammogram normal after ultrasound. History of GERD with normal endoscopy managed by GI. History of benign hematuria. HSV 1 occasional outbreaks feels like they are more frequent.  Past medical history, past surgical history, family history and social history were all reviewed and documented in the EPIC chart. Supervisor work increased stress. Tiffany Gallagher 21 quit college now working doing okay.  ROS:  A ROS was performed and pertinent positives and negatives are included.  Exam:  Filed Vitals:   06/19/15 0826  BP: 132/78    General appearance:  Normal Thyroid:  Symmetrical, normal in size, without palpable masses or nodularity. Respiratory  Auscultation:  Clear without wheezing or rhonchi Cardiovascular  Auscultation:  Regular rate, without rubs, murmurs or gallops  Edema/varicosities:  Not grossly evident Abdominal  Soft,nontender, without masses, guarding or rebound.  Liver/spleen:  No organomegaly noted  Hernia:  None appreciated  Skin  Inspection:  Grossly normal   Breasts: Examined lying and sitting.     Right: Without masses, retractions, discharge or axillary adenopathy.     Left: Without masses, retractions, discharge or axillary adenopathy. Gentitourinary   Inguinal/mons:  Normal without inguinal adenopathy  External genitalia:  Normal  BUS/Urethra/Skene's glands:  Normal  Vagina:  White adherent discharge, wet prep positive for moderate clues and moderate bacteria  Cervix:  And uterus absent Adnexa/parametria:     Rt: Without masses or tenderness.   Lt: Without masses or tenderness.  Anus and perineum: Normal  Digital rectal exam: Normal  sphincter tone without palpated masses or tenderness  Assessment/Plan:  50 y.o. SBF G1 P1 for annual exam with complaint of vaginal discharge with odor.  TVH for fibroids Bacteria vaginosis STD screen Hypertension-primary care manages labs and meds Smoker less than half pack daily History of benign hematuria HSV-1 Morbid obesity  Plan: Flagyl 500 twice daily for 7 days, alcohol precautions reviewed. Diflucan 150 by mouth 1 dose per request if vaginal itching occurs. SBE's, continue annual 3-D screening mammogram, calcium rich diet, vitamin D 1000 daily encouraged. Reviewed importance of decreasing calories and increasing exercise weightbearing activity. Aware of hazards of smoking reviewed ways to  decrease. Valtrex 500 by mouth daily will take daily for a month or 2 and then decrease use to see if symptoms resolve.Encouraged condoms, home safety and fall prevention discussed. Colonoscopy at age 38. UA. Menopause reviewed.      Huel Cote [facility], 9:54 AM 06/19/2015

## 2015-06-20 ENCOUNTER — Telehealth: Payer: Self-pay | Admitting: Family Medicine

## 2015-06-20 LAB — GC/CHLAMYDIA PROBE AMP
CT PROBE, AMP APTIMA: NOT DETECTED
GC Probe RNA: NOT DETECTED

## 2015-06-20 LAB — URINE CULTURE
COLONY COUNT: NO GROWTH
ORGANISM ID, BACTERIA: NO GROWTH

## 2015-06-20 NOTE — Telephone Encounter (Signed)
lvm for pt asking about flu vac. ALSO informed pt that appt (CPE) need to be rescheduled. Informed pt to call back .

## 2015-06-21 ENCOUNTER — Other Ambulatory Visit: Payer: Self-pay | Admitting: Women's Health

## 2015-06-21 DIAGNOSIS — R3129 Other microscopic hematuria: Secondary | ICD-10-CM

## 2015-06-22 ENCOUNTER — Other Ambulatory Visit: Payer: BLUE CROSS/BLUE SHIELD

## 2015-06-22 ENCOUNTER — Other Ambulatory Visit: Payer: Self-pay | Admitting: *Deleted

## 2015-06-22 DIAGNOSIS — R3129 Other microscopic hematuria: Secondary | ICD-10-CM

## 2015-06-22 DIAGNOSIS — N760 Acute vaginitis: Secondary | ICD-10-CM

## 2015-06-22 DIAGNOSIS — B9689 Other specified bacterial agents as the cause of diseases classified elsewhere: Secondary | ICD-10-CM

## 2015-06-22 LAB — HEPATITIS B SURFACE ANTIGEN: HEP B S AG: NEGATIVE

## 2015-06-22 LAB — HIV ANTIBODY (ROUTINE TESTING W REFLEX): HIV: NONREACTIVE

## 2015-06-22 LAB — HEPATITIS C ANTIBODY: HCV AB: NEGATIVE

## 2015-06-22 MED ORDER — METRONIDAZOLE 500 MG PO TABS: 500.0000 mg | ORAL_TABLET | Freq: Two times a day (BID) | ORAL | Status: DC

## 2015-06-22 NOTE — Telephone Encounter (Signed)
Pt needs Flagyl sent to local pharmacy not mail order will send in to [place]

## 2015-06-23 DIAGNOSIS — G4733 Obstructive sleep apnea (adult) (pediatric): Secondary | ICD-10-CM | POA: Diagnosis not present

## 2015-06-23 LAB — URINALYSIS W MICROSCOPIC + REFLEX CULTURE
Bacteria, UA: NONE SEEN [HPF]
Bilirubin Urine: NEGATIVE
CASTS: NONE SEEN [LPF]
Crystals: NONE SEEN [HPF]
Glucose, UA: NEGATIVE
Ketones, ur: NEGATIVE
Leukocytes, UA: NEGATIVE
NITRITE: NEGATIVE
PH: 5 (ref 5.0–8.0)
SPECIFIC GRAVITY, URINE: 1.026 (ref 1.001–1.035)
YEAST: NONE SEEN [HPF]

## 2015-06-23 LAB — RPR

## 2015-06-24 LAB — URINE CULTURE
Colony Count: NO GROWTH
ORGANISM ID, BACTERIA: NO GROWTH

## 2015-07-18 ENCOUNTER — Telehealth: Payer: Self-pay | Admitting: Family Medicine

## 2015-07-18 ENCOUNTER — Encounter: Payer: Self-pay | Admitting: Family Medicine

## 2015-07-18 NOTE — Telephone Encounter (Signed)
Attempted to contact patient on 3/23 and 3/28 via

## 2015-07-18 NOTE — Telephone Encounter (Signed)
Attempted to contact patient on 3/23 and 3/28 via phone and also via My Chart to inform of the need to reschedule appointment scheduled for February 12, 2016 @ 8:30 with Dr. Carollee Herter. Appointment cancelled and letter mailed to patient 07/18/2015.

## 2015-07-24 DIAGNOSIS — G4733 Obstructive sleep apnea (adult) (pediatric): Secondary | ICD-10-CM | POA: Diagnosis not present

## 2015-12-20 ENCOUNTER — Encounter: Payer: Self-pay | Admitting: Women's Health

## 2015-12-20 ENCOUNTER — Ambulatory Visit (INDEPENDENT_AMBULATORY_CARE_PROVIDER_SITE_OTHER): Payer: BLUE CROSS/BLUE SHIELD | Admitting: Women's Health

## 2015-12-20 VITALS — BP 130/82

## 2015-12-20 DIAGNOSIS — A499 Bacterial infection, unspecified: Secondary | ICD-10-CM

## 2015-12-20 DIAGNOSIS — R350 Frequency of micturition: Secondary | ICD-10-CM

## 2015-12-20 DIAGNOSIS — B373 Candidiasis of vulva and vagina: Secondary | ICD-10-CM

## 2015-12-20 DIAGNOSIS — N760 Acute vaginitis: Secondary | ICD-10-CM

## 2015-12-20 DIAGNOSIS — N898 Other specified noninflammatory disorders of vagina: Secondary | ICD-10-CM | POA: Diagnosis not present

## 2015-12-20 DIAGNOSIS — B3731 Acute candidiasis of vulva and vagina: Secondary | ICD-10-CM

## 2015-12-20 DIAGNOSIS — B9689 Other specified bacterial agents as the cause of diseases classified elsewhere: Secondary | ICD-10-CM

## 2015-12-20 LAB — URINALYSIS W MICROSCOPIC + REFLEX CULTURE
Bilirubin Urine: NEGATIVE
Casts: NONE SEEN [LPF]
Crystals: NONE SEEN [HPF]
GLUCOSE, UA: NEGATIVE
LEUKOCYTES UA: NEGATIVE
Nitrite: NEGATIVE
PH: 6.5 (ref 5.0–8.0)
Specific Gravity, Urine: 1.025 (ref 1.001–1.035)
Yeast: NONE SEEN [HPF]

## 2015-12-20 LAB — WET PREP FOR TRICH, YEAST, CLUE
TRICH WET PREP: NONE SEEN
WBC, Wet Prep HPF POC: NONE SEEN
Yeast Wet Prep HPF POC: NONE SEEN

## 2015-12-20 MED ORDER — FLUCONAZOLE 150 MG PO TABS: 150.0000 mg | ORAL_TABLET | Freq: Once | ORAL | 1 refills | Status: AC

## 2015-12-20 MED ORDER — METRONIDAZOLE 500 MG PO TABS: 500.0000 mg | ORAL_TABLET | Freq: Two times a day (BID) | ORAL | 0 refills | Status: DC

## 2015-12-20 NOTE — Patient Instructions (Signed)

## 2015-12-20 NOTE — Progress Notes (Signed)
Presents with complaint of vaginal discharge with odor for the past week. History of recurrent bacteria vaginosis. Has had some increase in frequency of urination but is on a diuretic for hypertension. Also questions if she has a blood clot in her right leg has had some tingly type of discomfort [place] after prolonged sitting at work. Denies pain or burning withurination. Occasional lower abdominal pain. Denies constipation, nausea or fever. Hysterectomy, vaginal dryness, same partner. Smoker, trying to quit, currently in a smoking cessation program at work. History benign hematuria.   Exam: Appears well. External genitalia mild erythema at introitus, speculum exam moderate amount of a white adherent discharge with odor noted, wet prep positive for clues, TNTC bacteria. UA: +2 blood, +2 protein, 10-20 rbc's, many bacteria Right leg good color, pedal and popliteal pulses present, no edema, capillary refill less than 3 seconds. Full mobility both extremities.  Bacterial vaginosis Morbid obesity Smoker Benign hematuria  Plan: Reviewed importance of increased mobility, exercise, encouraged to stand up and move every 2 hours at work. Follow-up with primary care if leg or foot edema, color changes, change in pain. Flagyl 500 twice daily for 7 days, alcohol precautions reviewed. After symptoms resolve boric acid gelcaps 600 mg 3 times weekly per vagina. Instructed to call if no relief aware of problems of circulation with smoking and obesity. Urine culture pending .

## 2015-12-22 LAB — URINE CULTURE: Organism ID, Bacteria: NO GROWTH

## 2016-01-17 ENCOUNTER — Other Ambulatory Visit: Payer: Self-pay | Admitting: Family Medicine

## 2016-01-24 ENCOUNTER — Encounter: Payer: Self-pay | Admitting: Family Medicine

## 2016-01-24 ENCOUNTER — Telehealth: Payer: Self-pay | Admitting: Family Medicine

## 2016-01-24 NOTE — Telephone Encounter (Signed)
Please advise      KP 

## 2016-01-24 NOTE — Telephone Encounter (Signed)
°  Relation to [id number] Call back number:[phone]   Reason for call:  Patient 02/12/2016 physical appointment was cancelled due to provider patient would like to know if she can have her physical with a PA?  Scheduled patient with PCP for 04/03/16(next available physical) and patient states due to receiving insurance incentive physical has to be conducted by 02/19/16. Informed patient she has been placed on a high priority waiting. Please advise

## 2016-01-25 NOTE — Telephone Encounter (Signed)
error:[id number] ° °

## 2016-01-28 NOTE — Telephone Encounter (Signed)
lvm informing patient of message below.  ===View-only below this line===  ----- Message ----- From: Ewing Schlein, CMA Sent: 01/25/2016   3:09 PM To: Oneta Rack    ----- Message ----- From: Ann Held, DO Sent: 01/25/2016   2:22 PM To: Ewing Schlein, CMA  Ok to see PA for cpe

## 2016-02-11 ENCOUNTER — Encounter: Payer: Self-pay | Admitting: Medical

## 2016-02-11 ENCOUNTER — Ambulatory Visit (INDEPENDENT_AMBULATORY_CARE_PROVIDER_SITE_OTHER): Payer: BLUE CROSS/BLUE SHIELD | Admitting: Medical

## 2016-02-11 VITALS — BP 126/80 | HR 75 | Temp 98.0°F | Ht 66.0 in | Wt 300.2 lb

## 2016-02-11 DIAGNOSIS — Z1211 Encounter for screening for malignant neoplasm of colon: Secondary | ICD-10-CM | POA: Diagnosis not present

## 2016-02-11 DIAGNOSIS — Z0000 Encounter for general adult medical examination without abnormal findings: Secondary | ICD-10-CM | POA: Diagnosis not present

## 2016-02-11 DIAGNOSIS — Z1231 Encounter for screening mammogram for malignant neoplasm of breast: Secondary | ICD-10-CM | POA: Diagnosis not present

## 2016-02-11 DIAGNOSIS — Z1239 Encounter for other screening for malignant neoplasm of breast: Secondary | ICD-10-CM

## 2016-02-11 DIAGNOSIS — R319 Hematuria, unspecified: Secondary | ICD-10-CM

## 2016-02-11 LAB — CBC WITH DIFFERENTIAL/PLATELET
BASOS ABS: 0 10*3/uL (ref 0.0–0.1)
Basophils Relative: 0.6 % (ref 0.0–3.0)
EOS PCT: 2.8 % (ref 0.0–5.0)
Eosinophils Absolute: 0.2 10*3/uL (ref 0.0–0.7)
HCT: 37.4 % (ref 36.0–46.0)
HEMOGLOBIN: 12.5 g/dL (ref 12.0–15.0)
Lymphocytes Relative: 39 % (ref 12.0–46.0)
Lymphs Abs: 3 10*3/uL (ref 0.7–4.0)
MCHC: 33.4 g/dL (ref 30.0–36.0)
MCV: 93.2 fl (ref 78.0–100.0)
MONOS PCT: 5.9 % (ref 3.0–12.0)
Monocytes Absolute: 0.5 10*3/uL (ref 0.1–1.0)
Neutro Abs: 4 10*3/uL (ref 1.4–7.7)
Neutrophils Relative %: 51.7 % (ref 43.0–77.0)
Platelets: 361 10*3/uL (ref 150.0–400.0)
RBC: 4.01 Mil/uL (ref 3.87–5.11)
RDW: 14.4 % (ref 11.5–15.5)
WBC: 7.7 10*3/uL (ref 4.0–10.5)

## 2016-02-11 LAB — POCT URINALYSIS DIPSTICK
BILIRUBIN UA: NEGATIVE
GLUCOSE UA: NEGATIVE
KETONES UA: NEGATIVE
Leukocytes, UA: NEGATIVE
NITRITE UA: NEGATIVE
PH UA: 6
SPEC GRAV UA: 1.025
Urobilinogen, UA: 0.2

## 2016-02-11 LAB — COMPREHENSIVE METABOLIC PANEL
ALBUMIN: 4.1 g/dL (ref 3.5–5.2)
ALK PHOS: 52 U/L (ref 39–117)
ALT: 15 U/L (ref 0–35)
AST: 16 U/L (ref 0–37)
BILIRUBIN TOTAL: 0.5 mg/dL (ref 0.2–1.2)
BUN: 11 mg/dL (ref 6–23)
CO2: 28 mEq/L (ref 19–32)
Calcium: 9.5 mg/dL (ref 8.4–10.5)
Chloride: 104 mEq/L (ref 96–112)
Creatinine, Ser: 0.58 mg/dL (ref 0.40–1.20)
GFR: 141.43 mL/min (ref >60.00)
Glucose, Bld: 98 mg/dL (ref 70–99)
POTASSIUM: 3.5 meq/L (ref 3.5–5.1)
SODIUM: 139 meq/L (ref 135–145)
TOTAL PROTEIN: 7.6 g/dL (ref 6.0–8.3)

## 2016-02-11 LAB — LIPID PANEL
CHOLESTEROL: 221 mg/dL — AB (ref 0–200)
HDL: 47.3 mg/dL (ref >39.00)
LDL Cholesterol: 148 mg/dL — ABNORMAL HIGH (ref 0–99)
NONHDL: 173.74
Total CHOL/HDL Ratio: 5
Triglycerides: 131 mg/dL (ref 0.0–149.0)
VLDL: 26.2 mg/dL (ref 0.0–40.0)

## 2016-02-11 LAB — TSH: TSH: 1.78 u[IU]/mL (ref 0.35–4.50)

## 2016-02-11 MED ORDER — ALBUTEROL SULFATE HFA 108 (90 BASE) MCG/ACT IN AERS: 2.0000 | INHALATION_SPRAY | Freq: Four times a day (QID) | RESPIRATORY_TRACT | 0 refills | Status: DC | PRN

## 2016-02-11 MED ORDER — HYDROCHLOROTHIAZIDE 25 MG PO TABS: 25.0000 mg | ORAL_TABLET | Freq: Every day | ORAL | 1 refills | Status: DC

## 2016-02-11 MED ORDER — BENZONATATE 100 MG PO CAPS: 100.0000 mg | ORAL_CAPSULE | Freq: Three times a day (TID) | ORAL | 0 refills | Status: DC | PRN

## 2016-02-11 MED ORDER — DOXYCYCLINE HYCLATE 100 MG PO TABS: 100.0000 mg | ORAL_TABLET | Freq: Two times a day (BID) | ORAL | 0 refills | Status: DC

## 2016-02-11 MED ORDER — FLUTICASONE PROPIONATE 50 MCG/ACT NA SUSP: 2.0000 | Freq: Every day | NASAL | 1 refills | Status: DC

## 2016-02-11 NOTE — Progress Notes (Signed)
Subjective:    Patient ID: Tiffany Gallagher, female    DOB: February 27, 1966, 50 y.o.   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    Pt here for physical. She is fasting.  Pt declines flu vaccine. But offered if changes mind let us know.  Pt has mammogram already scheduled due for next month. Pt just got a letter from clinic that she thinks is reminder but has not opened it.  Pt states she has partial hysterectomy. Pt seen gyn every year told does not need pap smear.  Pt willing to have colonoscopy done and will put in referral today.  Up to date on tetanus.  Pt does self breast exam. Nothing of concern found per pt.    Review of Systems  Constitutional: Negative for chills, fatigue and fever.       No fever, no chills or sweats today. But some early on.  HENT: Positive for congestion, postnasal drip and sinus pressure. Negative for facial swelling, sneezing and sore throat.        At onset of illness 3 weeks ago had some below.  Pt still has some nasal congestion. Some teeth senstive over past 3 weeks.   Still feeling these symptoms today.  Respiratory: Positive for cough and wheezing.        Coughing for 3 weeks. Has been using mucinex. No yellow or colored mucous. Does look clear.   Wheezing just little bit. A out one week ago.     Cardiovascular: Negative for chest pain and palpitations.  Gastrointestinal: Negative for abdominal pain, constipation, diarrhea and nausea.  Endocrine: Negative for polydipsia, polyphagia and polyuria.  Genitourinary: Negative for dyspareunia, dysuria, flank pain and frequency.  Musculoskeletal: Negative for back pain, joint swelling, myalgias and neck stiffness.  Skin: Negative for rash.  Neurological: Negative for dizziness, tremors, light-headedness, numbness and  headaches.  Hematological: Negative for adenopathy. Does not bruise/bleed easily.  Psychiatric/Behavioral: Negative for behavioral problems, confusion, hallucinations and sleep disturbance. The patient is not nervous/anxious.      Past Medical History:  Diagnosis Date  . Allergy   . Fibroid   . Hyperlipidemia   . Hypertension   . Migraines      Social History   Social History  . Marital status: Divorced    Spouse name: N/A  . Number of children: 1  . Years of education: 3   Occupational History  . customer service     Social History Main Topics  . Smoking status: Current Every Day Smoker    Packs/day: 0.50    Years: 30.00    Types: Cigarettes  . Smokeless tobacco: Never Used     Comment: cutting back  . Alcohol use 0.0 oz/week     Comment: rarely  . Drug use: No  . Sexual activity: Yes    Partners: Male    Birth control/ protection: Surgical, Condom   Other Topics Concern  . Not on file   Social History Narrative   Exercise-- no    Past Surgical History:  Procedure Laterality Date  . CESAREAN SECTION  1995  . VAGINAL HYSTERECTOMY  1998   partial    Family History  Problem Relation Age of Onset  . Coronary artery disease Mother   . Hypertension Mother   .  Kidney disease Mother 72    dialysis  . Aneurysm Mother     brain  . Stroke Mother   . Heart disease Mother     MI  . Cancer Father 82    brain cancer  . Diabetes Sister   . Hyperlipidemia Sister   . Hypertension Sister   . Arthritis Brother   . Hypertension Brother   . Diabetes Brother   . Hypertension Brother   . Hypertension Sister     Allergies  Allergen Reactions  . Lipitor [Atorvastatin Calcium]     Nerves, muscle spasms, light stool    Current Outpatient Prescriptions on File Prior to Visit  Medication Sig Dispense Refill  . benazepril (LOTENSIN) 10 MG tablet Take 1 tablet (10 mg total) by mouth daily. 90 tablet 0  . cyclobenzaprine (FLEXERIL) 10 MG tablet Take 1 tablet (10  mg total) by mouth 2 (two) times daily as needed for muscle spasms. 30 tablet 0  . hydrochlorothiazide (HYDRODIURIL) 25 MG tablet TAKE 1 TABLET DAILY 90 tablet 1  . meloxicam (MOBIC) 15 MG tablet Take 1 tablet (15 mg total) by mouth daily. 30 tablet 0  . Multiple Vitamin (MULTIVITAMIN WITH MINERALS) TABS Take 1 tablet by mouth daily.    . valACYclovir (VALTREX) 500 MG tablet Take daily (Patient taking differently: as needed. Take daily) 90 tablet 4  . omeprazole (PRILOSEC) 20 MG capsule Take 1 capsule (20 mg total) by mouth daily. 90 capsule 3   No current facility-administered medications on file prior to visit.     BP 126/80 (BP Location: Right Arm, Patient Position: Sitting)   Pulse 75   Temp 98 F (36.7 C) (Oral)   Ht 5\' 6"  (1.676 m)   Wt (!) 300 lb 3.2 oz (136.2 kg)   SpO2 97%   BMI 48.45 kg/m       Objective:   Physical Exam  General  Mental Status - Alert. General Appearance - Well groomed. Not in acute distress.  Skin Rashes- No Rashes.  HEENT Head- Normal. Ear Auditory Canal - Left- Normal. Right - Normal.Tympanic Membrane- Left- Normal. Right- Normal. Eye Sclera/Conjunctiva- Left- Normal. Right- Normal. Nose & Sinuses Nasal Mucosa- Left-  Boggy and Congested. Right-  Boggy and  Congested.Bilateral maxillary and frontal sinus pressure. Mouth & Throat Lips: Upper Lip- Normal: no dryness, cracking, pallor, cyanosis, or vesicular eruption. Lower Lip-Normal: no dryness, cracking, pallor, cyanosis or vesicular eruption. Buccal Mucosa- Bilateral- No Aphthous ulcers. Oropharynx- No Discharge or Erythema. Tonsils: Characteristics- Bilateral- No Erythema or Congestion. Size/Enlargement- Bilateral- No enlargement. Discharge- bilateral-None.  Neck Neck- Supple. No Masses.  Chest and Lung Exam Auscultation: Breath Sounds:-Clear even and unlabored.  Cardiovascular Auscultation:Rythm- Regular, rate and rhythm. Murmurs & Other Heart Sounds:Ausculatation of the heart  reveal- No Murmurs.  Lymphatic Head & Neck General Head & Neck Lymphatics: Bilateral: Description- No Localized lymphadenopathy.  Abdomen Inspection:-Inspeection Normal. Palpation/Percussion:Note:No mass. Palpation and Percussion of the abdomen reveal- Non Tender, Non Distended + BS, no rebound or guarding.  Neurologic Cranial Nerve exam:- CN III-XII intact(No nystagmus), symmetric smile. Strength:- 5/5 equal and symmetric strength both upper and lower extremities.  Skin- no inspection no worrisome lesions or moles on inspection. Breast- deferred.       Assessment & Plan:  For your wellness exam will get cbc, cmp, tsh, lipid, and urine poct dip.  Please setup your GI referral as they have already called you.  I have set up your screening mammogram. The mammogram/imaging center clinic has already  sent out a letter to you as you stated.  You appear to have had bronchitis, sinus infection with possible reactive airways. Will rx flonase nasal spray, benzonatate for cough, doxycycline antibiotic and albuterol for wheezing.  Follow up date to be determined after lab review.

## 2016-02-11 NOTE — Patient Instructions (Addendum)
For your wellness exam will get cbc, cmp, tsh, lipid, and urine poct dip.  Please setup your GI referral as they have already called you.  I have set up your screening mammogram. The mammogram/imaging  center has already sent out a letter to you as you stated.  You appear to have had bronchitis, sinus infection with possible reactive airways. Will rx flonase nasal spray, benzonatate for cough, doxycycline antibiotic and albuterol for wheezing. If symptoms persist then recommend cxr. But not indicated presently.  Follow up date to be determined after lab review.   Preventive Care for Adults, Female A healthy lifestyle and preventive care can promote health and wellness. Preventive health guidelines for women include the following key practices.  A routine yearly physical is a good way to check with your health care provider about your health and preventive screening. It is a chance to share any concerns and updates on your health and to receive a thorough exam.  Visit your dentist for a routine exam and preventive care every 6 months. Brush your teeth twice a day and floss once a day. Good oral hygiene prevents tooth decay and gum disease.  The frequency of eye exams is based on your age, health, family medical history, use of contact lenses, and other factors. Follow your health care provider's recommendations for frequency of eye exams.  Eat a healthy diet. Foods like vegetables, fruits, whole grains, low-fat dairy products, and lean protein foods contain the nutrients you need without too many calories. Decrease your intake of foods high in solid fats, added sugars, and salt. Eat the right amount of calories for you.Get information about a proper diet from your health care provider, if necessary.  Regular physical exercise is one of the most important things you can do for your health. Most adults should get at least 150 minutes of moderate-intensity exercise (any activity that increases  your heart rate and causes you to sweat) each week. In addition, most adults need muscle-strengthening exercises on 2 or more days a week.  Maintain a healthy weight. The body mass index (BMI) is a screening tool to identify possible weight problems. It provides an estimate of body fat based on height and weight. Your health care provider can find your BMI and can help you achieve or maintain a healthy weight.For adults 20 years and older:  A BMI below 18.5 is considered underweight.  A BMI of 18.5 to 24.9 is normal.  A BMI of 25 to 29.9 is considered overweight.  A BMI of 30 and above is considered obese.  Maintain normal blood lipids and cholesterol levels by exercising and minimizing your intake of saturated fat. Eat a balanced diet with plenty of fruit and vegetables. Blood tests for lipids and cholesterol should begin at age 87 and be repeated every 5 years. If your lipid or cholesterol levels are high, you are over 50, or you are at high risk for heart disease, you may need your cholesterol levels checked more frequently.Ongoing high lipid and cholesterol levels should be treated with medicines if diet and exercise are not working.  If you smoke, find out from your health care provider how to quit. If you do not use tobacco, do not start.  Lung cancer screening is recommended for adults aged 23-80 years who are at high risk for developing lung cancer because of a history of smoking. A yearly low-dose CT scan of the lungs is recommended for people who have at least a 30-pack-year history of  smoking and are a current smoker or have quit within the past 15 years. A pack year of smoking is smoking an average of 1 pack of cigarettes a day for 1 year (for example: 1 pack a day for 30 years or 2 packs a day for 15 years). Yearly screening should continue until the smoker has stopped smoking for at least 15 years. Yearly screening should be stopped for people who develop a health problem that would  prevent them from having lung cancer treatment.  If you are pregnant, do not drink alcohol. If you are breastfeeding, be very cautious about drinking alcohol. If you are not pregnant and choose to drink alcohol, do not have more than 1 drink per day. One drink is considered to be 12 ounces (355 mL) of beer, 5 ounces (148 mL) of wine, or 1.5 ounces (44 mL) of liquor.  Avoid use of street drugs. Do not share needles with anyone. Ask for help if you need support or instructions about stopping the use of drugs.  High blood pressure causes heart disease and increases the risk of stroke. Your blood pressure should be checked at least every 1 to 2 years. Ongoing high blood pressure should be treated with medicines if weight loss and exercise do not work.  If you are 33-61 years old, ask your health care provider if you should take aspirin to prevent strokes.  Diabetes screening is done by taking a blood sample to check your blood glucose level after you have not eaten for a certain period of time (fasting). If you are not overweight and you do not have risk factors for diabetes, you should be screened once every 3 years starting at age 33. If you are overweight or obese and you are 30-36 years of age, you should be screened for diabetes every year as part of your cardiovascular risk assessment.  Breast cancer screening is essential preventive care for women. You should practice "breast self-awareness." This means understanding the normal appearance and feel of your breasts and may include breast self-examination. Any changes detected, no matter how small, should be reported to a health care provider. Women in their 49s and 30s should have a clinical breast exam (CBE) by a health care provider as part of a regular health exam every 1 to 3 years. After age 32, women should have a CBE every year. Starting at age 69, women should consider having a mammogram (breast X-ray test) every year. Women who have a family  history of breast cancer should talk to their health care provider about genetic screening. Women at a high risk of breast cancer should talk to their health care providers about having an MRI and a mammogram every year.  Breast cancer gene (BRCA)-related cancer risk assessment is recommended for women who have family members with BRCA-related cancers. BRCA-related cancers include breast, ovarian, tubal, and peritoneal cancers. Having family members with these cancers may be associated with an increased risk for harmful changes (mutations) in the breast cancer genes BRCA1 and BRCA2. Results of the assessment will determine the need for genetic counseling and BRCA1 and BRCA2 testing.  Your health care provider may recommend that you be screened regularly for cancer of the pelvic organs (ovaries, uterus, and vagina). This screening involves a pelvic examination, including checking for microscopic changes to the surface of your cervix (Pap test). You may be encouraged to have this screening done every 3 years, beginning at age 48.  For women ages 5-65, health care  providers may recommend pelvic exams and Pap testing every 3 years, or they may recommend the Pap and pelvic exam, combined with testing for human papilloma virus (HPV), every 5 years. Some types of HPV increase your risk of cervical cancer. Testing for HPV may also be done on women of any age with unclear Pap test results.  Other health care providers may not recommend any screening for nonpregnant women who are considered low risk for pelvic cancer and who do not have symptoms. Ask your health care provider if a screening pelvic exam is right for you.  If you have had past treatment for cervical cancer or a condition that could lead to cancer, you need Pap tests and screening for cancer for at least 20 years after your treatment. If Pap tests have been discontinued, your risk factors (such as having a new sexual partner) need to be reassessed to  determine if screening should resume. Some women have medical problems that increase the chance of getting cervical cancer. In these cases, your health care provider may recommend more frequent screening and Pap tests.  Colorectal cancer can be detected and often prevented. Most routine colorectal cancer screening begins at the age of 64 years and continues through age 31 years. However, your health care provider may recommend screening at an earlier age if you have risk factors for colon cancer. On a yearly basis, your health care provider may provide home test kits to check for hidden blood in the stool. Use of a small camera at the end of a tube, to directly examine the colon (sigmoidoscopy or colonoscopy), can detect the earliest forms of colorectal cancer. Talk to your health care provider about this at age 66, when routine screening begins. Direct exam of the colon should be repeated every 5-10 years through age 69 years, unless early forms of precancerous polyps or small growths are found.  People who are at an increased risk for hepatitis B should be screened for this virus. You are considered at high risk for hepatitis B if:  You were born in a country where hepatitis B occurs often. Talk with your health care provider about which countries are considered high risk.  Your parents were born in a high-risk country and you have not received a shot to protect against hepatitis B (hepatitis B vaccine).  You have HIV or AIDS.  You use needles to inject street drugs.  You live with, or have sex with, someone who has hepatitis B.  You get hemodialysis treatment.  You take certain medicines for conditions like cancer, organ transplantation, and autoimmune conditions.  Hepatitis C blood testing is recommended for all people born from [birth year] through 1965 and any individual with known risks for hepatitis C.  Practice safe sex. Use condoms and avoid high-risk sexual practices to reduce the spread of  sexually transmitted infections (STIs). STIs include gonorrhea, chlamydia, syphilis, trichomonas, herpes, HPV, and human immunodeficiency virus (HIV). Herpes, HIV, and HPV are viral illnesses that have no cure. They can result in disability, cancer, and death.  You should be screened for sexually transmitted illnesses (STIs) including gonorrhea and chlamydia if:  You are sexually active and are younger than 24 years.  You are older than 24 years and your health care provider tells you that you are at risk for this type of infection.  Your sexual activity has changed since you were last screened and you are at an increased risk for chlamydia or gonorrhea. Ask your health care provider  if you are at risk.  If you are at risk of being infected with HIV, it is recommended that you take a prescription medicine daily to prevent HIV infection. This is called preexposure prophylaxis (PrEP). You are considered at risk if:  You are sexually active and do not regularly use condoms or know the HIV status of your partner(s).  You take drugs by injection.  You are sexually active with a partner who has HIV.  Talk with your health care provider about whether you are at high risk of being infected with HIV. If you choose to begin PrEP, you should first be tested for HIV. You should then be tested every 3 months for as long as you are taking PrEP.  Osteoporosis is a disease in which the bones lose minerals and strength with aging. This can result in serious bone fractures or breaks. The risk of osteoporosis can be identified using a bone density scan. Women ages 45 years and over and women at risk for fractures or osteoporosis should discuss screening with their health care providers. Ask your health care provider whether you should take a calcium supplement or vitamin D to reduce the rate of osteoporosis.  Menopause can be associated with physical symptoms and risks. Hormone replacement therapy is available to  decrease symptoms and risks. You should talk to your health care provider about whether hormone replacement therapy is right for you.  Use sunscreen. Apply sunscreen liberally and repeatedly throughout the day. You should seek shade when your shadow is shorter than you. Protect yourself by wearing long sleeves, pants, a wide-brimmed hat, and sunglasses year round, whenever you are outdoors.  Once a month, do a whole body skin exam, using a mirror to look at the skin on your back. Tell your health care provider of new moles, moles that have irregular borders, moles that are larger than a pencil eraser, or moles that have changed in shape or color.  Stay current with required vaccines (immunizations).  Influenza vaccine. All adults should be immunized every year.  Tetanus, diphtheria, and acellular pertussis (Td, Tdap) vaccine. Pregnant women should receive 1 dose of Tdap vaccine during each pregnancy. The dose should be obtained regardless of the length of time since the last dose. Immunization is preferred during the 27th-36th week of gestation. An adult who has not previously received Tdap or who does not know her vaccine status should receive 1 dose of Tdap. This initial dose should be followed by tetanus and diphtheria toxoids (Td) booster doses every 10 years. Adults with an unknown or incomplete history of completing a 3-dose immunization series with Td-containing vaccines should begin or complete a primary immunization series including a Tdap dose. Adults should receive a Td booster every 10 years.  Varicella vaccine. An adult without evidence of immunity to varicella should receive 2 doses or a second dose if she has previously received 1 dose. Pregnant females who do not have evidence of immunity should receive the first dose after pregnancy. This first dose should be obtained before leaving the health care facility. The second dose should be obtained 4-8 weeks after the first dose.  Human  papillomavirus (HPV) vaccine. Females aged 13-26 years who have not received the vaccine previously should obtain the 3-dose series. The vaccine is not recommended for use in pregnant females. However, pregnancy testing is not needed before receiving a dose. If a female is found to be pregnant after receiving a dose, no treatment is needed. In that case, the  remaining doses should be delayed until after the pregnancy. Immunization is recommended for any person with an immunocompromised condition through the age of 53 years if she did not get any or all doses earlier. During the 3-dose series, the second dose should be obtained 4-8 weeks after the first dose. The third dose should be obtained 24 weeks after the first dose and 16 weeks after the second dose.  Zoster vaccine. One dose is recommended for adults aged 34 years or older unless certain conditions are present.  Measles, mumps, and rubella (MMR) vaccine. Adults born before [birth year] generally are considered immune to measles and mumps. Adults born in [birth year] or later should have 1 or more doses of MMR vaccine unless there is a contraindication to the vaccine or there is laboratory evidence of immunity to each of the three diseases. A routine second dose of MMR vaccine should be obtained at least 28 days after the first dose for students attending postsecondary schools, health care workers, or international travelers. People who received inactivated measles vaccine or an unknown type of measles vaccine during 1963-1967 should receive 2 doses of MMR vaccine. People who received inactivated mumps vaccine or an unknown type of mumps vaccine before 1979 and are at high risk for mumps infection should consider immunization with 2 doses of MMR vaccine. For females of childbearing age, rubella immunity should be determined. If there is no evidence of immunity, females who are not pregnant should be vaccinated. If there is no evidence of immunity, females who are pregnant  should delay immunization until after pregnancy. Unvaccinated health care workers born before [birth year] who lack laboratory evidence of measles, mumps, or rubella immunity or laboratory confirmation of disease should consider measles and mumps immunization with 2 doses of MMR vaccine or rubella immunization with 1 dose of MMR vaccine.  Pneumococcal 13-valent conjugate (PCV13) vaccine. When indicated, a person who is uncertain of his immunization history and has no record of immunization should receive the PCV13 vaccine. All adults 28 years of age and older should receive this vaccine. An adult aged 47 years or older who has certain medical conditions and has not been previously immunized should receive 1 dose of PCV13 vaccine. This PCV13 should be followed with a dose of pneumococcal polysaccharide (PPSV23) vaccine. Adults who are at high risk for pneumococcal disease should obtain the PPSV23 vaccine at least 8 weeks after the dose of PCV13 vaccine. Adults older than 50 years of age who have normal immune system function should obtain the PPSV23 vaccine dose at least 1 year after the dose of PCV13 vaccine.  Pneumococcal polysaccharide (PPSV23) vaccine. When PCV13 is also indicated, PCV13 should be obtained first. All adults aged 48 years and older should be immunized. An adult younger than age 40 years who has certain medical conditions should be immunized. Any person who resides in a nursing home or long-term care facility should be immunized. An adult smoker should be immunized. People with an immunocompromised condition and certain other conditions should receive both PCV13 and PPSV23 vaccines. People with human immunodeficiency virus (HIV) infection should be immunized as soon as possible after diagnosis. Immunization during chemotherapy or radiation therapy should be avoided. Routine use of PPSV23 vaccine is not recommended for American Indians, [place] Natives, or people younger than 65 years unless there are  medical conditions that require PPSV23 vaccine. When indicated, people who have unknown immunization and have no record of immunization should receive PPSV23 vaccine. One-time revaccination 5 years after the first dose  of PPSV23 is recommended for people aged 19-64 years who have chronic kidney failure, nephrotic syndrome, asplenia, or immunocompromised conditions. People who received 1-2 doses of PPSV23 before age 70 years should receive another dose of PPSV23 vaccine at age 56 years or later if at least 5 years have passed since the previous dose. Doses of PPSV23 are not needed for people immunized with PPSV23 at or after age 74 years.  Meningococcal vaccine. Adults with asplenia or persistent complement component deficiencies should receive 2 doses of quadrivalent meningococcal conjugate (MenACWY-D) vaccine. The doses should be obtained at least 2 months apart. Microbiologists working with certain meningococcal bacteria, [place] recruits, people at risk during an outbreak, and people who travel to or live in countries with a high rate of meningitis should be immunized. A first-year college student up through age 52 years who is living in a residence hall should receive a dose if she did not receive a dose on or after her 16th birthday. Adults who have certain high-risk conditions should receive one or more doses of vaccine.  Hepatitis A vaccine. Adults who wish to be protected from this disease, have certain high-risk conditions, work with hepatitis A-infected animals, work in hepatitis A research labs, or travel to or work in countries with a high rate of hepatitis A should be immunized. Adults who were previously unvaccinated and who anticipate close contact with an international adoptee during the first 60 days after arrival in the [place] from a country with a high rate of hepatitis A should be immunized.  Hepatitis B vaccine. Adults who wish to be protected from this disease, have certain  high-risk conditions, may be exposed to blood or other infectious body fluids, are household contacts or sex partners of hepatitis B positive people, are clients or workers in certain care facilities, or travel to or work in countries with a high rate of hepatitis B should be immunized.  Haemophilus influenzae type b (Hib) vaccine. A previously unvaccinated person with asplenia or sickle cell disease or having a scheduled splenectomy should receive 1 dose of Hib vaccine. Regardless of previous immunization, a recipient of a hematopoietic stem cell transplant should receive a 3-dose series 6-12 months after her successful transplant. Hib vaccine is not recommended for adults with HIV infection. Preventive Services / Frequency Ages 32 to 20 years  Blood pressure check.** / Every 3-5 years.  Lipid and cholesterol check.** / Every 5 years beginning at age 45.  Clinical breast exam.** / Every 3 years for women in their 83s and 87s.  BRCA-related cancer risk assessment.** / For women who have family members with a BRCA-related cancer (breast, ovarian, tubal, or peritoneal cancers).  Pap test.** / Every 2 years from ages 4 through 52. Every 3 years starting at age 31 through age 70 or 60 with a history of 3 consecutive normal Pap tests.  HPV screening.** / Every 3 years from ages 81 through ages 24 to 41 with a history of 3 consecutive normal Pap tests.  Hepatitis C blood test.** / For any individual with known risks for hepatitis C.  Skin self-exam. / Monthly.  Influenza vaccine. / Every year.  Tetanus, diphtheria, and acellular pertussis (Tdap, Td) vaccine.** / Consult your health care provider. Pregnant women should receive 1 dose of Tdap vaccine during each pregnancy. 1 dose of Td every 10 years.  Varicella vaccine.** / Consult your health care provider. Pregnant females who do not have evidence of immunity should receive the first dose after pregnancy.  HPV vaccine. / 3 doses over 6  months, if 87 and younger. The vaccine is not recommended for use in pregnant females. However, pregnancy testing is not needed before receiving a dose.  Measles, mumps, rubella (MMR) vaccine.** / You need at least 1 dose of MMR if you were born in 1957 or later. You may also need a 2nd dose. For females of childbearing age, rubella immunity should be determined. If there is no evidence of immunity, females who are not pregnant should be vaccinated. If there is no evidence of immunity, females who are pregnant should delay immunization until after pregnancy.  Pneumococcal 13-valent conjugate (PCV13) vaccine.** / Consult your health care provider.  Pneumococcal polysaccharide (PPSV23) vaccine.** / 1 to 2 doses if you smoke cigarettes or if you have certain conditions.  Meningococcal vaccine.** / 1 dose if you are age 7 to 23 years and a Market researcher living in a residence hall, or have one of several medical conditions, you need to get vaccinated against meningococcal disease. You may also need additional booster doses.  Hepatitis A vaccine.** / Consult your health care provider.  Hepatitis B vaccine.** / Consult your health care provider.  Haemophilus influenzae type b (Hib) vaccine.** / Consult your health care provider. Ages 58 to 80 years  Blood pressure check.** / Every year.  Lipid and cholesterol check.** / Every 5 years beginning at age 50 years.  Lung cancer screening. / Every year if you are aged 14-80 years and have a 30-pack-year history of smoking and currently smoke or have quit within the past 15 years. Yearly screening is stopped once you have quit smoking for at least 15 years or develop a health problem that would prevent you from having lung cancer treatment.  Clinical breast exam.** / Every year after age 44 years.  BRCA-related cancer risk assessment.** / For women who have family members with a BRCA-related cancer (breast, ovarian, tubal, or peritoneal  cancers).  Mammogram.** / Every year beginning at age 56 years and continuing for as long as you are in good health. Consult with your health care provider.  Pap test.** / Every 3 years starting at age 7 years through age 96 or 66 years with a history of 3 consecutive normal Pap tests.  HPV screening.** / Every 3 years from ages 16 years through ages 28 to 54 years with a history of 3 consecutive normal Pap tests.  Fecal occult blood test (FOBT) of stool. / Every year beginning at age 78 years and continuing until age 55 years. You may not need to do this test if you get a colonoscopy every 10 years.  Flexible sigmoidoscopy or colonoscopy.** / Every 5 years for a flexible sigmoidoscopy or every 10 years for a colonoscopy beginning at age 83 years and continuing until age 7 years.  Hepatitis C blood test.** / For all people born from [birth year] through 1965 and any individual with known risks for hepatitis C.  Skin self-exam. / Monthly.  Influenza vaccine. / Every year.  Tetanus, diphtheria, and acellular pertussis (Tdap/Td) vaccine.** / Consult your health care provider. Pregnant women should receive 1 dose of Tdap vaccine during each pregnancy. 1 dose of Td every 10 years.  Varicella vaccine.** / Consult your health care provider. Pregnant females who do not have evidence of immunity should receive the first dose after pregnancy.  Zoster vaccine.** / 1 dose for adults aged 43 years or older.  Measles, mumps, rubella (MMR) vaccine.** / You need at least  1 dose of MMR if you were born in 1957 or later. You may also need a second dose. For females of childbearing age, rubella immunity should be determined. If there is no evidence of immunity, females who are not pregnant should be vaccinated. If there is no evidence of immunity, females who are pregnant should delay immunization until after pregnancy.  Pneumococcal 13-valent conjugate (PCV13) vaccine.** / Consult your health care  provider.  Pneumococcal polysaccharide (PPSV23) vaccine.** / 1 to 2 doses if you smoke cigarettes or if you have certain conditions.  Meningococcal vaccine.** / Consult your health care provider.  Hepatitis A vaccine.** / Consult your health care provider.  Hepatitis B vaccine.** / Consult your health care provider.  Haemophilus influenzae type b (Hib) vaccine.** / Consult your health care provider. Ages 29 years and over  Blood pressure check.** / Every year.  Lipid and cholesterol check.** / Every 5 years beginning at age 66 years.  Lung cancer screening. / Every year if you are aged 64-80 years and have a 30-pack-year history of smoking and currently smoke or have quit within the past 15 years. Yearly screening is stopped once you have quit smoking for at least 15 years or develop a health problem that would prevent you from having lung cancer treatment.  Clinical breast exam.** / Every year after age 9 years.  BRCA-related cancer risk assessment.** / For women who have family members with a BRCA-related cancer (breast, ovarian, tubal, or peritoneal cancers).  Mammogram.** / Every year beginning at age 14 years and continuing for as long as you are in good health. Consult with your health care provider.  Pap test.** / Every 3 years starting at age 62 years through age 18 or 18 years with 3 consecutive normal Pap tests. Testing can be stopped between 65 and 70 years with 3 consecutive normal Pap tests and no abnormal Pap or HPV tests in the past 10 years.  HPV screening.** / Every 3 years from ages 57 years through ages 101 or 67 years with a history of 3 consecutive normal Pap tests. Testing can be stopped between 65 and 70 years with 3 consecutive normal Pap tests and no abnormal Pap or HPV tests in the past 10 years.  Fecal occult blood test (FOBT) of stool. / Every year beginning at age 50 years and continuing until age 16 years. You may not need to do this test if you get a  colonoscopy every 10 years.  Flexible sigmoidoscopy or colonoscopy.** / Every 5 years for a flexible sigmoidoscopy or every 10 years for a colonoscopy beginning at age 49 years and continuing until age 59 years.  Hepatitis C blood test.** / For all people born from [birth year] through 1965 and any individual with known risks for hepatitis C.  Osteoporosis screening.** / A one-time screening for women ages 52 years and over and women at risk for fractures or osteoporosis.  Skin self-exam. / Monthly.  Influenza vaccine. / Every year.  Tetanus, diphtheria, and acellular pertussis (Tdap/Td) vaccine.** / 1 dose of Td every 10 years.  Varicella vaccine.** / Consult your health care provider.  Zoster vaccine.** / 1 dose for adults aged 1 years or older.  Pneumococcal 13-valent conjugate (PCV13) vaccine.** / Consult your health care provider.  Pneumococcal polysaccharide (PPSV23) vaccine.** / 1 dose for all adults aged 21 years and older.  Meningococcal vaccine.** / Consult your health care provider.  Hepatitis A vaccine.** / Consult your health care provider.  Hepatitis B vaccine.** /  Consult your health care provider.  Haemophilus influenzae type b (Hib) vaccine.** / Consult your health care provider. ** Family history and personal history of risk and conditions may change your health care provider's recommendations.   This information is not intended to replace advice given to you by your health care provider. Make sure you discuss any questions you have with your health care provider.   Document Released: 06/03/2001 Document Revised: 04/28/2014 Document Reviewed: 09/02/2010 Elsevier Interactive Patient Education [employer].

## 2016-02-11 NOTE — Progress Notes (Signed)
Pre visit review using our clinic review tool, if applicable. No additional management support is needed unless otherwise documented below in the visit note. 

## 2016-02-12 ENCOUNTER — Encounter: Payer: BLUE CROSS/BLUE SHIELD | Admitting: Family Medicine

## 2016-02-12 LAB — URINE CULTURE

## 2016-02-13 ENCOUNTER — Telehealth: Payer: Self-pay | Admitting: Family Medicine

## 2016-02-13 ENCOUNTER — Telehealth: Payer: Self-pay | Admitting: Medical

## 2016-02-13 MED ORDER — FLUCONAZOLE 150 MG PO TABS: 150.0000 mg | ORAL_TABLET | Freq: Once | ORAL | 0 refills | Status: AC

## 2016-02-13 NOTE — Telephone Encounter (Signed)
Pt says that provider was suppose to write her a Rx for Diflucan (yeast infection) she says that it was never done.   Please advise   CB: [phone]   Pharmacy: [facility] [address] [place] - [address]

## 2016-02-13 NOTE — Telephone Encounter (Signed)
rx diflucan sent to her pharmacy. Will you notify her.

## 2016-02-13 NOTE — Telephone Encounter (Signed)
Please advise 

## 2016-02-21 NOTE — Telephone Encounter (Signed)
Patient me

## 2016-03-01 IMAGING — CR DG CHEST 2V
2 series · 2 of 2 positions shown · non-contrast
Comparison: 08/22/2012.

CLINICAL DATA: Cough, congestion, fatigue for 1 day.

EXAM:
CHEST  2 VIEW

[w chest pa]
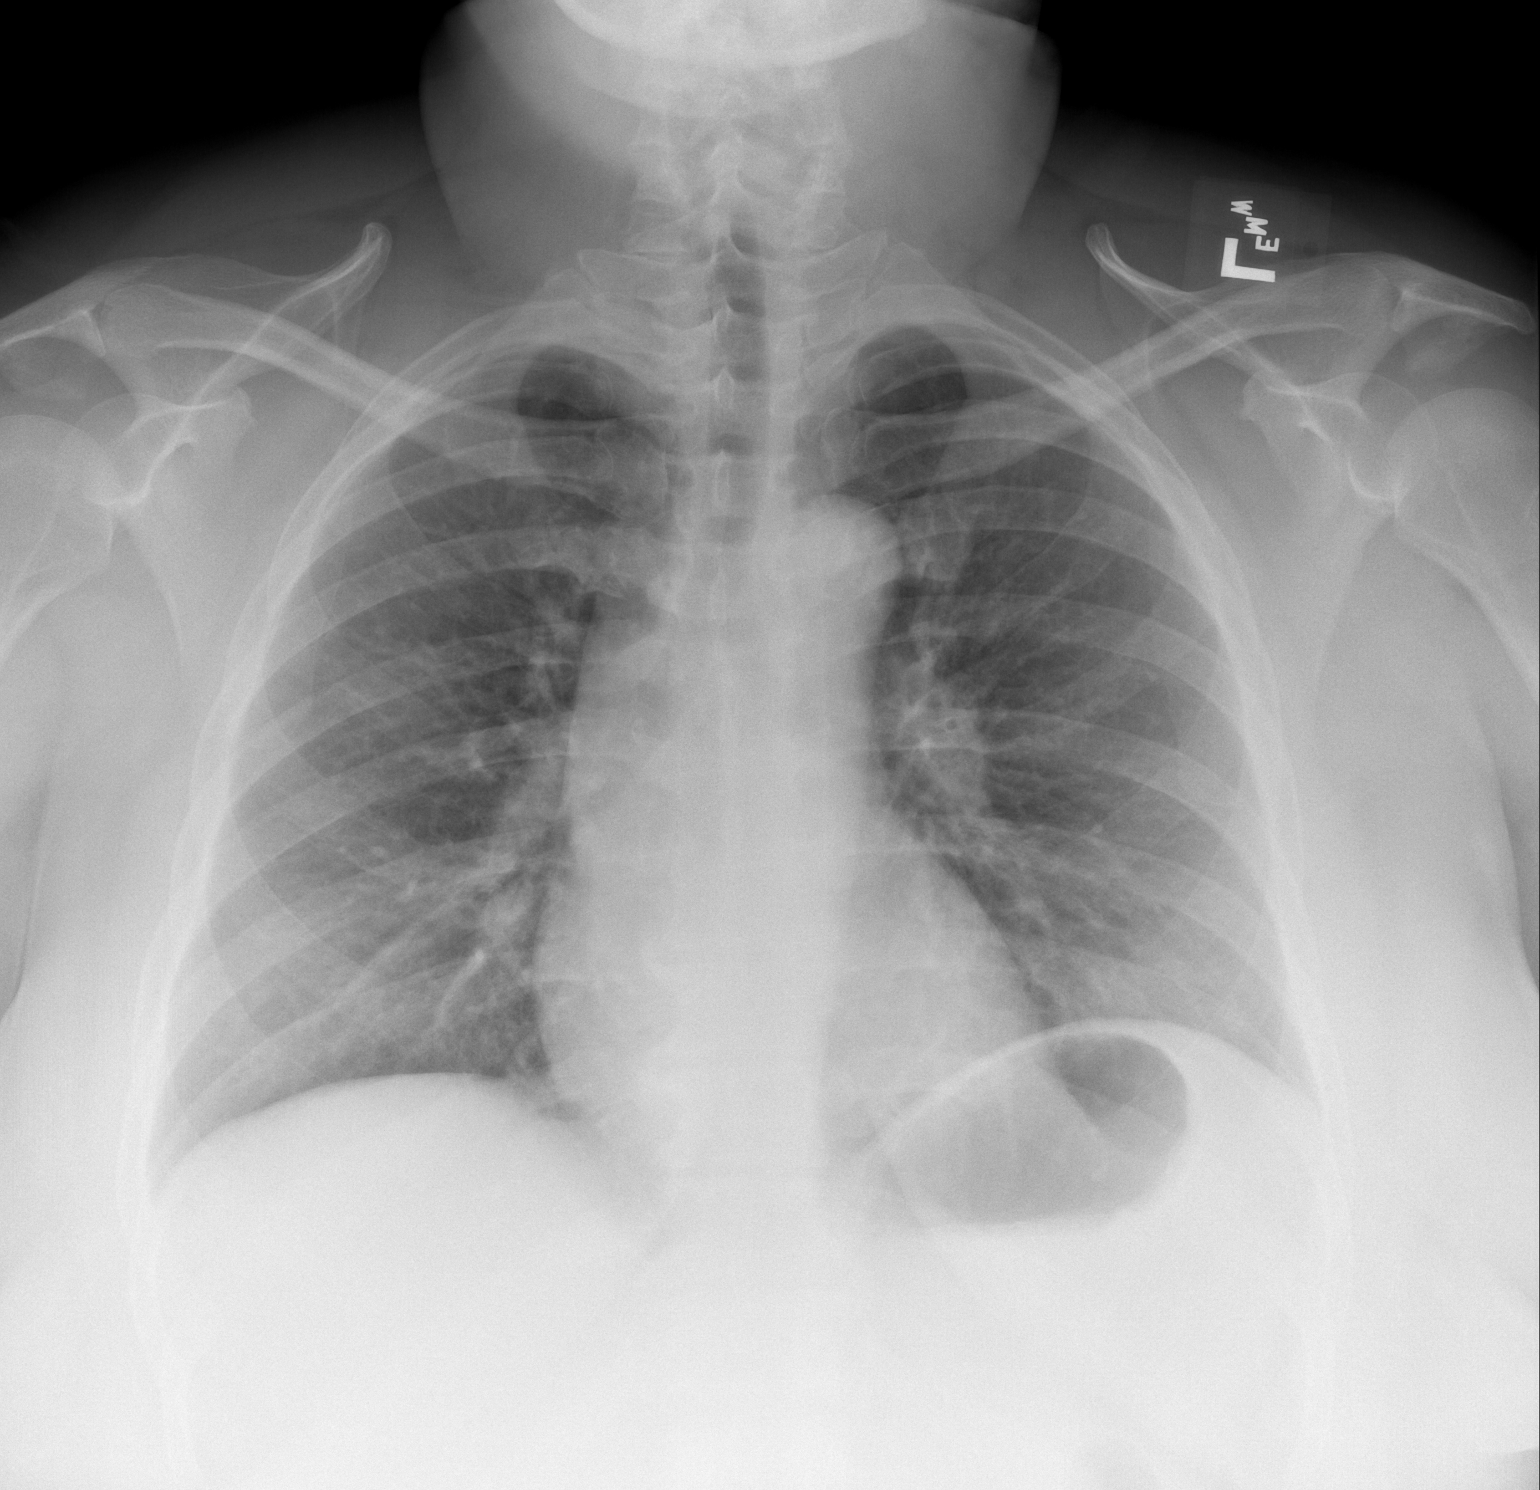

[w chest lat]
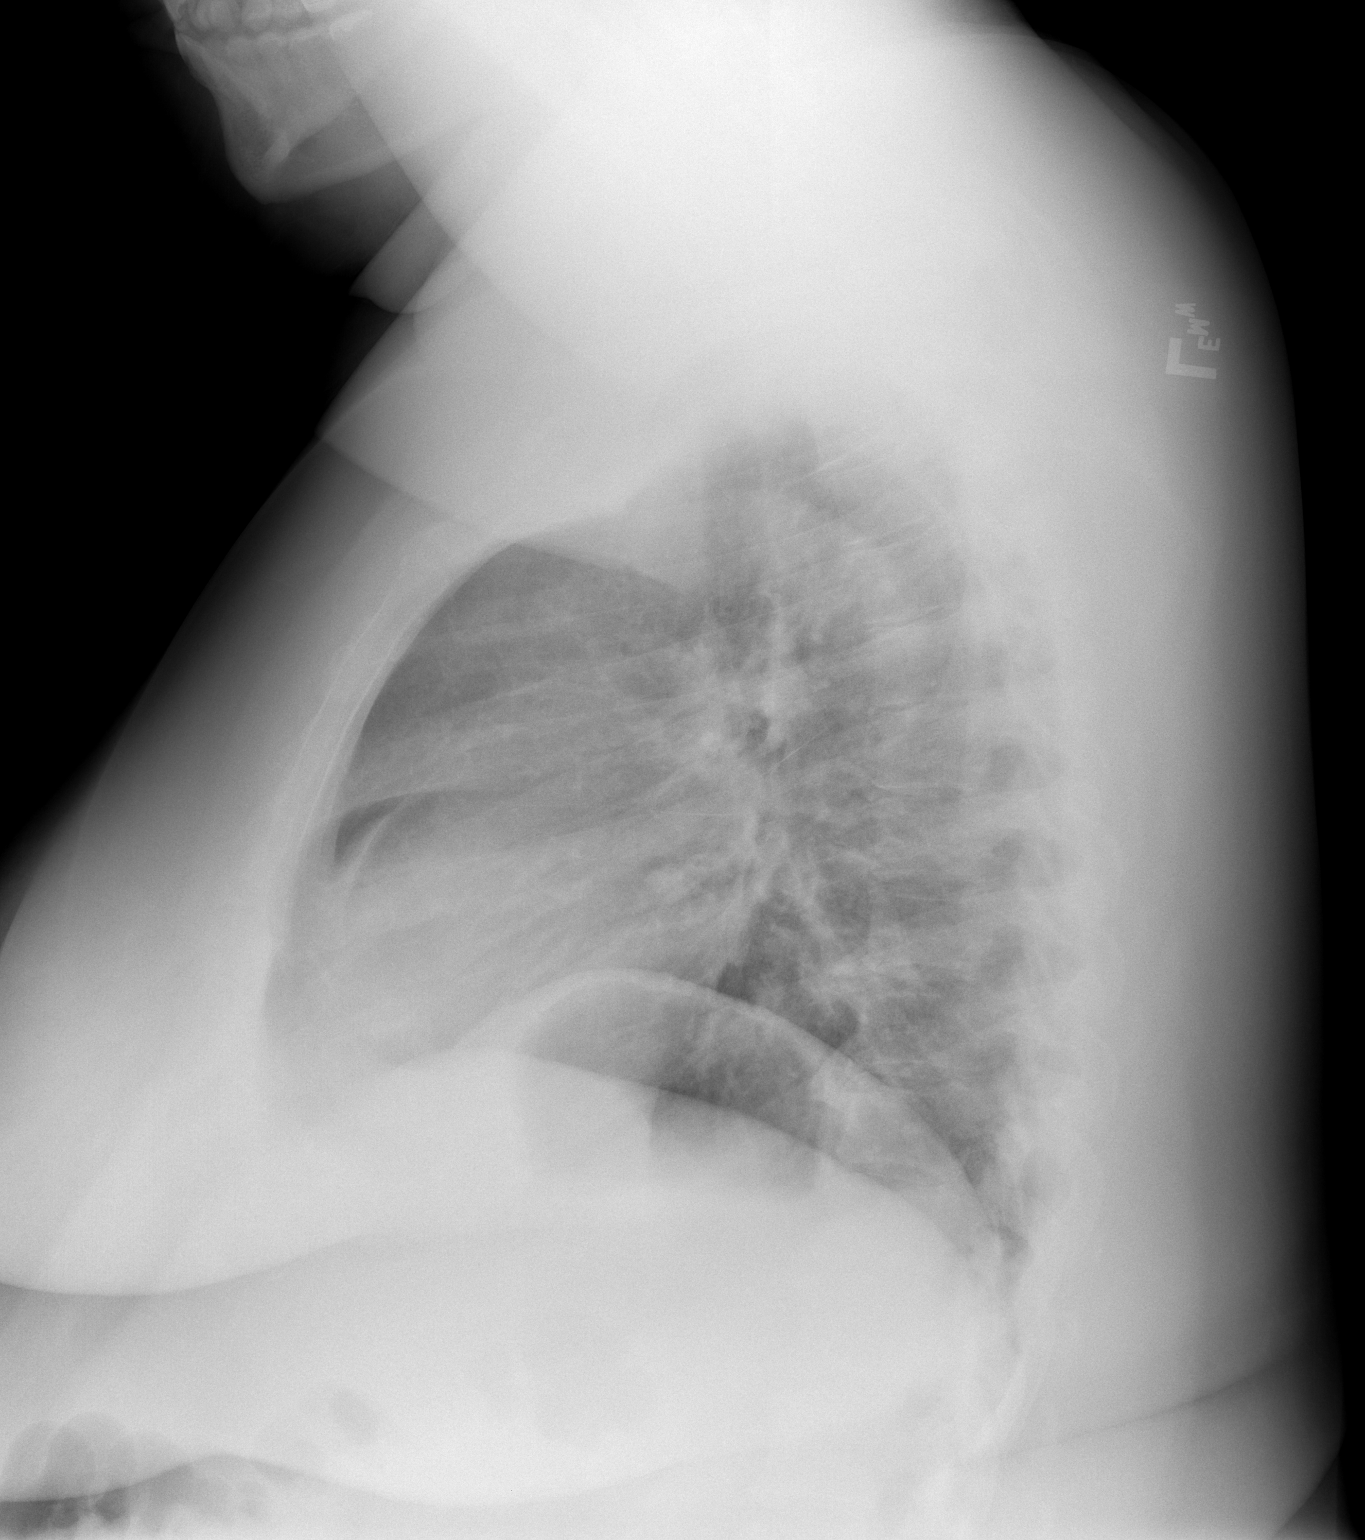

[2 of 2 positions shown; findings below may reference images not displayed]

FINDINGS: Trachea is midline. Heart size normal. Lungs are clear. No pleural
fluid.
IMPRESSION: No acute findings.

## 2016-03-11 ENCOUNTER — Ambulatory Visit (HOSPITAL_BASED_OUTPATIENT_CLINIC_OR_DEPARTMENT_OTHER): Payer: BLUE CROSS/BLUE SHIELD

## 2016-03-11 ENCOUNTER — Encounter: Payer: Self-pay | Admitting: Gastroenterology

## 2016-03-31 ENCOUNTER — Encounter: Payer: BLUE CROSS/BLUE SHIELD | Admitting: Gastroenterology

## 2016-04-03 ENCOUNTER — Encounter: Payer: BLUE CROSS/BLUE SHIELD | Admitting: Family Medicine

## 2016-04-18 ENCOUNTER — Ambulatory Visit
Admission: RE | Admit: 2016-04-18 | Discharge: 2016-04-18 | Disposition: A | Discharge status: Home / Self Care | Payer: BLUE CROSS/BLUE SHIELD | Source: Ambulatory Visit | Attending: Medical | Admitting: Medical

## 2016-04-18 ENCOUNTER — Other Ambulatory Visit: Payer: Self-pay | Admitting: Family Medicine

## 2016-04-18 DIAGNOSIS — Z1239 Encounter for other screening for malignant neoplasm of breast: Secondary | ICD-10-CM

## 2016-04-18 DIAGNOSIS — Z1231 Encounter for screening mammogram for malignant neoplasm of breast: Secondary | ICD-10-CM | POA: Diagnosis not present

## 2016-05-05 ENCOUNTER — Ambulatory Visit (AMBULATORY_SURGERY_CENTER): Payer: Self-pay

## 2016-05-05 VITALS — Ht 65.75 in | Wt 297.8 lb

## 2016-05-05 DIAGNOSIS — Z1211 Encounter for screening for malignant neoplasm of colon: Secondary | ICD-10-CM

## 2016-05-05 MED ORDER — SUPREP BOWEL PREP KIT 17.5-3.13-1.6 GM/177ML PO SOLN: 1.0000 | Freq: Once | ORAL | 0 refills | Status: AC

## 2016-05-05 NOTE — Progress Notes (Signed)
No allergies to eggs or soy No past problems with anesthesia No diet meds No home oxygen  Declined emmi 

## 2016-05-13 ENCOUNTER — Telehealth: Payer: Self-pay | Admitting: Gastroenterology

## 2016-05-14 NOTE — Telephone Encounter (Signed)
Left message on machine to call back  

## 2016-05-14 NOTE — Telephone Encounter (Signed)
Pt advised she can take OTC cold medicine and she is ok to have her procedure as long as she is not running a fever.  She will call back if she is no better by her procedure on 05/19/16

## 2016-05-19 ENCOUNTER — Ambulatory Visit (AMBULATORY_SURGERY_CENTER): Payer: BLUE CROSS/BLUE SHIELD | Admitting: Gastroenterology

## 2016-05-19 ENCOUNTER — Encounter: Payer: Self-pay | Admitting: Gastroenterology

## 2016-05-19 VITALS — BP 139/90 | HR 72 | Temp 98.9°F | Resp 12 | Ht 65.75 in | Wt 297.0 lb

## 2016-05-19 DIAGNOSIS — D123 Benign neoplasm of transverse colon: Secondary | ICD-10-CM

## 2016-05-19 DIAGNOSIS — K635 Polyp of colon: Secondary | ICD-10-CM

## 2016-05-19 DIAGNOSIS — K5730 Diverticulosis of large intestine without perforation or abscess without bleeding: Secondary | ICD-10-CM | POA: Diagnosis not present

## 2016-05-19 DIAGNOSIS — Z1211 Encounter for screening for malignant neoplasm of colon: Secondary | ICD-10-CM | POA: Diagnosis not present

## 2016-05-19 DIAGNOSIS — Z1212 Encounter for screening for malignant neoplasm of rectum: Secondary | ICD-10-CM | POA: Diagnosis not present

## 2016-05-19 MED ORDER — SODIUM CHLORIDE 0.9 % IV SOLN: 500.0000 mL | INTRAVENOUS | Status: DC

## 2016-05-19 NOTE — Patient Instructions (Signed)
Discharge instructions given. Handouts on polyps and diverticulosis. Resume previous medications. YOU HAD AN ENDOSCOPIC PROCEDURE TODAY AT THE [facility]:   Refer to the procedure report that was given to you for any specific questions about what was found during the examination.  If the procedure report does not answer your questions, please call your gastroenterologist to clarify.  If you requested that your care partner not be given the details of your procedure findings, then the procedure report has been included in a sealed envelope for you to review at your convenience later.  YOU SHOULD EXPECT: Some feelings of bloating in the abdomen. Passage of more gas than usual.  Walking can help get rid of the air that was put into your GI tract during the procedure and reduce the bloating. If you had a lower endoscopy (such as a colonoscopy or flexible sigmoidoscopy) you may notice spotting of blood in your stool or on the toilet paper. If you underwent a bowel prep for your procedure, you may not have a normal bowel movement for a few days.  Please Note:  You might notice some irritation and congestion in your nose or some drainage.  This is from the oxygen used during your procedure.  There is no need for concern and it should clear up in a day or so.  SYMPTOMS TO REPORT IMMEDIATELY:   Following lower endoscopy (colonoscopy or flexible sigmoidoscopy):  Excessive amounts of blood in the stool  Significant tenderness or worsening of abdominal pains  Swelling of the abdomen that is new, acute  Fever of 100F or higher   For urgent or emergent issues, a gastroenterologist can be reached at any hour by calling [phone].   DIET:  We do recommend a small meal at first, but then you may proceed to your regular diet.  Drink plenty of fluids but you should avoid alcoholic beverages for 24 hours.  ACTIVITY:  You should plan to take it easy for the rest of today and you should NOT  DRIVE or use heavy machinery until tomorrow (because of the sedation medicines used during the test).    FOLLOW UP: Our staff will call the number listed on your records the next business day following your procedure to check on you and address any questions or concerns that you may have regarding the information given to you following your procedure. If we do not reach you, we will leave a message.  However, if you are feeling well and you are not experiencing any problems, there is no need to return our call.  We will assume that you have returned to your regular daily activities without incident.  If any biopsies were taken you will be contacted by phone or by letter within the next 1-3 weeks.  Please call us at [phone] if you have not heard about the biopsies in 3 weeks.    SIGNATURES/CONFIDENTIALITY: You and/or your care partner have signed paperwork which will be entered into your electronic medical record.  These signatures attest to the fact that that the information above on your After Visit Summary has been reviewed and is understood.  Full responsibility of the confidentiality of this discharge information lies with you and/or your care-partner. 

## 2016-05-19 NOTE — Progress Notes (Signed)
Called to room to assist during endoscopic procedure.  Patient ID and intended procedure confirmed with present staff. Received instructions for my participation in the procedure from the performing physician.  

## 2016-05-19 NOTE — Op Note (Signed)
[facility] Patient Name: Tiffany Gallagher Procedure Date: 05/19/2016 7:57 AM MRN: [id number] Endoscopist: Milus Banister , MD Age: 51 Referring MD:  Date of Birth: [birth year] Gender: Female Account #: [id number] Procedure:                Colonoscopy Indications:              Screening for colorectal malignant neoplasm Medicines:                Monitored Anesthesia Care Procedure:                Pre-Anesthesia Assessment:                           - Prior to the procedure, a History and Physical                            was performed, and patient medications and                            allergies were reviewed. The patient's tolerance of                            previous anesthesia was also reviewed. The risks                            and benefits of the procedure and the sedation                            options and risks were discussed with the patient.                            All questions were answered, and informed consent                            was obtained. Prior Anticoagulants: The patient has                            taken no previous anticoagulant or antiplatelet                            agents. ASA Grade Assessment: III - A patient with                            severe systemic disease. After reviewing the risks                            and benefits, the patient was deemed in                            satisfactory condition to undergo the procedure.                           After obtaining informed consent, the colonoscope  was passed under direct vision. Throughout the                            procedure, the patient's blood pressure, pulse, and                            oxygen saturations were monitored continuously. The                            Model CF-HQ190L [id number]) scope was introduced                            through the anus and advanced to the the cecum,                            identified  by appendiceal orifice and ileocecal                            valve. The colonoscopy was performed without                            difficulty. The patient tolerated the procedure                            well. The quality of the bowel preparation was                            good. The ileocecal valve, appendiceal orifice, and                            rectum were photographed. Scope In: 8:00:08 AM Scope Out: 8:11:42 AM Scope Withdrawal Time: 0 hours 9 minutes 15 seconds  Total Procedure Duration: 0 hours 11 minutes 34 seconds  Findings:                 Two sessile polyps were found in the transverse                            colon. The polyps were 2 to 3 mm in size. These                            polyps were removed with a cold snare. Resection                            and retrieval were complete.                           Multiple small-mouthed diverticula were found in                            the entire colon.                           The exam was otherwise without abnormality on  direct and retroflexion views. Complications:            No immediate complications. Estimated blood loss:                            None. Estimated Blood Loss:     Estimated blood loss: none. Impression:               - Two 2 to 3 mm polyps in the transverse colon,                            removed with a cold snare. Resected and retrieved.                           - Diverticulosis in the entire examined colon.                           - The examination was otherwise normal on direct                            and retroflexion views. Recommendation:           - Patient has a contact number available for                            emergencies. The signs and symptoms of potential                            delayed complications were discussed with the                            patient. Return to normal activities tomorrow.                            Written  discharge instructions were provided to the                            patient.                           - Resume previous diet.                           - Continue present medications.                           You will receive a letter within 2-3 weeks with the                            pathology results and my final recommendations.                           If the polyp(s) is proven to be 'pre-cancerous' on                            pathology, you will need repeat colonoscopy in 5  years. If the polyp(s) is NOT 'precancerous' on                            pathology then you should repeat colon cancer                            screening in 10 years with colonoscopy without need                            for colon cancer screening by any method prior to                            then (including stool testing). Milus Banister, MD 05/19/2016 8:14:31 AM This report has been signed electronically.

## 2016-05-19 NOTE — Progress Notes (Signed)
Report given to PACU RN, vss 

## 2016-05-20 ENCOUNTER — Telehealth: Payer: Self-pay

## 2016-05-20 NOTE — Telephone Encounter (Signed)
  Follow up Call-  Call back number 05/19/2016  Post procedure Call Back phone  # [phone]  Permission to leave phone message Yes  Some recent data might be hidden     Patient questions:  Do you have a fever, pain , or abdominal swelling? No. Pain Score  0 *  Have you tolerated food without any problems? Yes.    Have you been able to return to your normal activities? Yes.    Do you have any questions about your discharge instructions: Diet   No. Medications  No. Follow up visit  No.  Do you have questions or concerns about your Care? No.  Actions: * If pain score is 4 or above: No action needed, pain <4.

## 2016-05-23 ENCOUNTER — Encounter: Payer: Self-pay | Admitting: Gastroenterology

## 2016-06-01 ENCOUNTER — Other Ambulatory Visit: Payer: Self-pay | Admitting: Family Medicine

## 2016-06-02 ENCOUNTER — Telehealth: Payer: Self-pay | Admitting: Pulmonary Disease

## 2016-06-02 DIAGNOSIS — R400 Somnolence: Secondary | ICD-10-CM

## 2016-06-02 NOTE — Telephone Encounter (Signed)
Spoke with pt, who is requesting cpap supplies. Pt last seen on 03-06-15. It doens't appear that pt has f/u since being set up on cpap. I have scheduled pt for f/u with TP on 06-05-16 @ 9:15a. Pt has requested that we place a order to renew cpap supplies before apt.  PM please advise. Thanks.

## 2016-06-02 NOTE — Telephone Encounter (Signed)
Ok to order CPAP supplies as she will be seen this week. Get CPAP download to review during clinic visit. Thanks  PM

## 2016-06-02 NOTE — Telephone Encounter (Signed)
Order placed. Called pt to let her know and advisedher to bring her downloads to next visit. Pt verbalized understanding and nothing further is needed.

## 2016-06-03 ENCOUNTER — Other Ambulatory Visit: Payer: Self-pay | Admitting: Family Medicine

## 2016-06-03 MED ORDER — BENAZEPRIL HCL 10 MG PO TABS: 10.0000 mg | ORAL_TABLET | Freq: Every day | ORAL | 0 refills | Status: DC

## 2016-06-04 ENCOUNTER — Encounter: Payer: Self-pay | Admitting: Adult Health

## 2016-06-04 ENCOUNTER — Ambulatory Visit: Payer: BLUE CROSS/BLUE SHIELD | Admitting: Adult Health

## 2016-06-05 ENCOUNTER — Encounter: Payer: Self-pay | Admitting: Adult Health

## 2016-06-05 ENCOUNTER — Ambulatory Visit (INDEPENDENT_AMBULATORY_CARE_PROVIDER_SITE_OTHER): Payer: BLUE CROSS/BLUE SHIELD | Admitting: Adult Health

## 2016-06-05 VITALS — BP 130/84 | HR 79 | Ht 66.0 in | Wt 301.0 lb

## 2016-06-05 DIAGNOSIS — G4733 Obstructive sleep apnea (adult) (pediatric): Secondary | ICD-10-CM | POA: Diagnosis not present

## 2016-06-05 NOTE — Assessment & Plan Note (Signed)
Severe OSA w/ excellent compliance and control on CPAP   Plan  Patient Instructions  Keep up good work on CPAP At bedtime   Order for new nasal pillow mask.  Stay active and work on weight loss.  Do not drive if sleepy.  Follow up with Dr. Elsworth Soho  In  1 year and As needed

## 2016-06-05 NOTE — Telephone Encounter (Signed)
Pt came in office and brought a copy of her new insurance card and rx insurance card too, copies are on pt's file,  Pt is needing her refill ASAP.  Pt's Tel [phone]

## 2016-06-05 NOTE — Patient Instructions (Signed)
Keep up good work on CPAP At bedtime   Order for new nasal pillow mask.  Stay active and work on weight loss.  Do not drive if sleepy.  Follow up with Dr. Elsworth Soho  In  1 year and As needed

## 2016-06-05 NOTE — Assessment & Plan Note (Signed)
Wt loss  

## 2016-06-05 NOTE — Progress Notes (Signed)
@Patient  ID: Tiffany Gallagher, female    DOB: October 23, 1965, 51 y.o.   MRN: [id number]  Chief Complaint  Patient presents with  . Follow-up    OSA     Referring provider: Ann Held, *  HPI: 51 yo female   TEST  Sleep study 03/2015 >severe AHI 151/hr , titration with 17cm optimal control   06/05/2016 Follow up : OSA  Patient presents for an annual follow-up for severe sleep apnea. Patient is maintained on C Pap at bedtime. She says she is doing very well. She wears it every single night. She gets in about 6-7 hours. She's on a set pressure of 17 cm H2O. Her download shows excellent compliance with average usage at 7 hours. She has an AHI at 0.3. She does have leaks.  Patient says she would like to try new mask . She has a full facemask. Currently would like to try nasal pillows. He feels rested . No significant daytime sleepiness. We discussed weight loss.  Allergies  Allergen Reactions  . Lactose Intolerance (Gi)   . Lipitor [Atorvastatin Calcium]     Nerves, muscle spasms, light stool    Immunization History  Administered Date(s) Administered  . Td 02/07/2008    Past Medical History:  Diagnosis Date  . Allergy   . Fibroid   . Hyperlipidemia   . Hypertension   . Migraines   . Obesity, morbid, BMI 40.0-49.9 ([place])   . Sleep apnea    cpap    Tobacco History: History  Smoking Status  . Current Every Day Smoker  . Packs/day: 0.50  . Years: 30.00  . Types: Cigarettes  Smokeless Tobacco  . Never Used    Comment: cutting back   Ready to quit: Not Answered Counseling given: Not Answered   Outpatient Encounter Prescriptions as of 06/05/2016  Medication Sig  . albuterol (PROVENTIL HFA;VENTOLIN HFA) 108 (90 Base) MCG/ACT inhaler Inhale 2 puffs into the lungs every 6 (six) hours as needed for wheezing or shortness of breath.  . Ascorbic Acid (VITAMIN C) 100 MG tablet Take 100 mg by mouth daily.  . benazepril (LOTENSIN) 10 MG tablet Take 1 tablet (10 mg  total) by mouth daily.  . benzonatate (TESSALON) 100 MG capsule Take 1 capsule (100 mg total) by mouth 3 (three) times daily as needed for cough.  . cyclobenzaprine (FLEXERIL) 10 MG tablet Take 1 tablet (10 mg total) by mouth 2 (two) times daily as needed for muscle spasms.  . fluticasone (FLONASE) 50 MCG/ACT nasal spray Place 2 sprays into both nostrils daily.  . hydrochlorothiazide (HYDRODIURIL) 25 MG tablet Take 1 tablet (25 mg total) by mouth daily.  Marland Kitchen loratadine (CLARITIN) 10 MG tablet Take 10 mg by mouth daily.  . meloxicam (MOBIC) 15 MG tablet Take 1 tablet (15 mg total) by mouth daily.  . Multiple Vitamin (MULTIVITAMIN WITH MINERALS) TABS Take 1 tablet by mouth daily.  Marland Kitchen OVER THE COUNTER MEDICATION Beano  . Potassium 99 MG TABS Take by mouth daily as needed.  . thiamine (VITAMIN B-1) 100 MG tablet Take 100 mg by mouth daily.  . valACYclovir (VALTREX) 500 MG tablet Take daily (Patient taking differently: as needed. Take daily)  . omeprazole (PRILOSEC) 20 MG capsule Take 1 capsule (20 mg total) by mouth daily.   Facility-Administered Encounter Medications as of 06/05/2016  Medication  . 0.9 %  sodium chloride infusion     Review of Systems  Constitutional:   No  weight loss, night  sweats,  Fevers, chills, fatigue, or  lassitude.  HEENT:   No headaches,  Difficulty swallowing,  Tooth/dental problems, or  Sore throat,                No sneezing, itching, ear ache, nasal congestion, post nasal drip,   CV:  No chest pain,  Orthopnea, PND, swelling in lower extremities, anasarca, dizziness, palpitations, syncope.   GI  No heartburn, indigestion, abdominal pain, nausea, vomiting, diarrhea, change in bowel habits, loss of appetite, bloody stools.   Resp: No shortness of breath with exertion or at rest.  No excess mucus, no productive cough,  No non-productive cough,  No coughing up of blood.  No change in color of mucus.  No wheezing.  No chest wall deformity  Skin: no rash or  lesions.  GU: no dysuria, change in color of urine, no urgency or frequency.  No flank pain, no hematuria   MS:  No joint pain or swelling.  No decreased range of motion.  No back pain.    Physical Exam  BP 130/84 (BP Location: Right Arm, Cuff Size: Large)   Pulse 79   Ht 5\' 6"  (1.676 m)   Wt (!) 301 lb (136.5 kg)   SpO2 97%   BMI 48.58 kg/m   GEN: A/Ox3; pleasant , NAD, obese    HEENT:  [place]/AT,  EACs-clear, TMs-wnl, NOSE-clear, THROAT-clear, no lesions, no postnasal drip or exudate noted. Class 2-3 MP airway   NECK:  Supple w/ fair ROM; no JVD; normal carotid impulses w/o bruits; no thyromegaly or nodules palpated; no lymphadenopathy.    RESP  Clear  P & A; w/o, wheezes/ rales/ or rhonchi. no accessory muscle use, no dullness to percussion  CARD:  RRR, no m/r/g, no peripheral edema, pulses intact, no cyanosis or clubbing.  GI:   Soft & nt; nml bowel sounds; no organomegaly or masses detected.   Musco: Warm bil, no deformities or joint swelling noted.   Neuro: alert, no focal deficits noted.    Skin: Warm, no lesions or rashes    Lab Results:  CBC    Component Value Date/Time   WBC 7.7 02/11/2016 1043   RBC 4.01 02/11/2016 1043   HGB 12.5 02/11/2016 1043   HCT 37.4 02/11/2016 1043   PLT 361.0 02/11/2016 1043   MCV 93.2 02/11/2016 1043   MCV 100.1 (A) 03/31/2012 1854   MCH 32.2 08/22/2012 1356   MCHC 33.4 02/11/2016 1043   RDW 14.4 02/11/2016 1043   LYMPHSABS 3.0 02/11/2016 1043   MONOABS 0.5 02/11/2016 1043   EOSABS 0.2 02/11/2016 1043   BASOSABS 0.0 02/11/2016 1043    BMET    Component Value Date/Time   NA 139 02/11/2016 1043   K 3.5 02/11/2016 1043   CL 104 02/11/2016 1043   CO2 28 02/11/2016 1043   GLUCOSE 98 02/11/2016 1043   BUN 11 02/11/2016 1043   CREATININE 0.58 02/11/2016 1043   CREATININE 0.76 03/31/2012 1853   CALCIUM 9.5 02/11/2016 1043   GFRNONAA >90 08/22/2012 1356   GFRAA >90 08/22/2012 1356    BNP No results found for:  BNP  ProBNP No results found for: PROBNP  Imaging: No results found.   Assessment & Plan:   OSA (obstructive sleep apnea) Severe OSA w/ excellent compliance and control on CPAP   Plan  Patient Instructions  Keep up good work on CPAP At bedtime   Order for new nasal pillow mask.  Stay active and work on weight  loss.  Do not drive if sleepy.  Follow up with Dr. Elsworth Soho  In  1 year and As needed       Severe obesity (BMI >= 40) Wt loss.      Rexene Edison, NP 06/05/2016

## 2016-06-10 DIAGNOSIS — G4733 Obstructive sleep apnea (adult) (pediatric): Secondary | ICD-10-CM | POA: Diagnosis not present

## 2016-06-11 NOTE — Progress Notes (Signed)
Reviewed & agree with plan  

## 2016-06-15 ENCOUNTER — Ambulatory Visit (INDEPENDENT_AMBULATORY_CARE_PROVIDER_SITE_OTHER): Payer: BLUE CROSS/BLUE SHIELD

## 2016-06-15 ENCOUNTER — Encounter (HOSPITAL_COMMUNITY): Payer: Self-pay | Admitting: *Deleted

## 2016-06-15 ENCOUNTER — Ambulatory Visit (HOSPITAL_COMMUNITY)
Admission: EM | Admit: 2016-06-15 | Discharge: 2016-06-15 | Disposition: A | Discharge status: Home / Self Care | Payer: BLUE CROSS/BLUE SHIELD | Attending: Family Medicine | Admitting: Family Medicine

## 2016-06-15 DIAGNOSIS — S92354A Nondisplaced fracture of fifth metatarsal bone, right foot, initial encounter for closed fracture: Secondary | ICD-10-CM | POA: Diagnosis not present

## 2016-06-15 DIAGNOSIS — S161XXA Strain of muscle, fascia and tendon at neck level, initial encounter: Secondary | ICD-10-CM | POA: Diagnosis not present

## 2016-06-15 DIAGNOSIS — M25511 Pain in right shoulder: Secondary | ICD-10-CM | POA: Diagnosis not present

## 2016-06-15 DIAGNOSIS — S92356A Nondisplaced fracture of fifth metatarsal bone, unspecified foot, initial encounter for closed fracture: Secondary | ICD-10-CM | POA: Diagnosis not present

## 2016-06-15 DIAGNOSIS — M79671 Pain in right foot: Secondary | ICD-10-CM

## 2016-06-15 MED ORDER — MELOXICAM 15 MG PO TABS: 15.0000 mg | ORAL_TABLET | Freq: Every day | ORAL | 0 refills | Status: DC

## 2016-06-15 NOTE — ED Triage Notes (Signed)
Reports stepping down off a step approx 2 hrs ago, injuring right ankle.  C/O difficulty bearing weight.  CMS intact.

## 2016-06-15 NOTE — Discharge Instructions (Signed)
Nice to meet you. Your right foot has a fracture at the base of the 5th toe region. It is not displaced. Suggest using the cam walker with all ambulation, and can take off to sleep. May continue to ice and elevate and use Mobic daily for pain and inflammation until feeling better. Please f/u with either the Orthopedic or with Dr. Gershon Mussel in the next 1-2 weeks.

## 2016-06-18 DIAGNOSIS — M79671 Pain in right foot: Secondary | ICD-10-CM | POA: Diagnosis not present

## 2016-06-18 DIAGNOSIS — E559 Vitamin D deficiency, unspecified: Secondary | ICD-10-CM | POA: Diagnosis not present

## 2016-06-18 DIAGNOSIS — S92354A Nondisplaced fracture of fifth metatarsal bone, right foot, initial encounter for closed fracture: Secondary | ICD-10-CM | POA: Diagnosis not present

## 2016-06-18 DIAGNOSIS — R5383 Other fatigue: Secondary | ICD-10-CM | POA: Diagnosis not present

## 2016-06-19 ENCOUNTER — Ambulatory Visit (INDEPENDENT_AMBULATORY_CARE_PROVIDER_SITE_OTHER): Payer: BLUE CROSS/BLUE SHIELD | Admitting: Women's Health

## 2016-06-19 ENCOUNTER — Encounter: Payer: Self-pay | Admitting: Women's Health

## 2016-06-19 VITALS — BP 132/86 | Ht 66.0 in | Wt 300.0 lb

## 2016-06-19 DIAGNOSIS — Z01419 Encounter for gynecological examination (general) (routine) without abnormal findings: Secondary | ICD-10-CM | POA: Diagnosis not present

## 2016-06-19 DIAGNOSIS — N898 Other specified noninflammatory disorders of vagina: Secondary | ICD-10-CM | POA: Diagnosis not present

## 2016-06-19 DIAGNOSIS — B009 Herpesviral infection, unspecified: Secondary | ICD-10-CM

## 2016-06-19 LAB — WET PREP FOR TRICH, YEAST, CLUE
Clue Cells Wet Prep HPF POC: NONE SEEN
Trich, Wet Prep: NONE SEEN
WBC WET PREP: NONE SEEN
YEAST WET PREP: NONE SEEN

## 2016-06-19 MED ORDER — VALACYCLOVIR HCL 500 MG PO TABS: ORAL_TABLET | ORAL | 4 refills | Status: DC

## 2016-06-19 NOTE — Patient Instructions (Signed)
Health Maintenance for Postmenopausal Women Menopause is a normal process in which your reproductive ability comes to an end. This process happens gradually over a span of months to years, usually between the ages of 33 and 38. Menopause is complete when you have missed 12 consecutive menstrual periods. It is important to talk with your health care provider about some of the most common conditions that affect postmenopausal women, such as heart disease, cancer, and bone loss (osteoporosis). Adopting a healthy lifestyle and getting preventive care can help to promote your health and wellness. Those actions can also lower your chances of developing some of these common conditions. What should I know about menopause? During menopause, you may experience a number of symptoms, such as:  Moderate-to-severe hot flashes.  Night sweats.  Decrease in sex drive.  Mood swings.  Headaches.  Tiredness.  Irritability.  Memory problems.  Insomnia. Choosing to treat or not to treat menopausal changes is an individual decision that you make with your health care provider. What should I know about hormone replacement therapy and supplements? Hormone therapy products are effective for treating symptoms that are associated with menopause, such as hot flashes and night sweats. Hormone replacement carries certain risks, especially as you become older. If you are thinking about using estrogen or estrogen with progestin treatments, discuss the benefits and risks with your health care provider. What should I know about heart disease and stroke? Heart disease, heart attack, and stroke become more likely as you age. This may be due, in part, to the hormonal changes that your body experiences during menopause. These can affect how your body processes dietary fats, triglycerides, and cholesterol. Heart attack and stroke are both medical emergencies. There are many things that you can do to help prevent heart disease  and stroke:  Have your blood pressure checked at least every 1-2 years. High blood pressure causes heart disease and increases the risk of stroke.  If you are 48-61 years old, ask your health care provider if you should take aspirin to prevent a heart attack or a stroke.  Do not use any tobacco products, including cigarettes, chewing tobacco, or electronic cigarettes. If you need help quitting, ask your health care provider.  It is important to eat a healthy diet and maintain a healthy weight.  Be sure to include plenty of vegetables, fruits, low-fat dairy products, and lean protein.  Avoid eating foods that are high in solid fats, added sugars, or salt (sodium).  Get regular exercise. This is one of the most important things that you can do for your health.  Try to exercise for at least 150 minutes each week. The type of exercise that you do should increase your heart rate and make you sweat. This is known as moderate-intensity exercise.  Try to do strengthening exercises at least twice each week. Do these in addition to the moderate-intensity exercise.  Know your numbers.Ask your health care provider to check your cholesterol and your blood glucose. Continue to have your blood tested as directed by your health care provider. What should I know about cancer screening? There are several types of cancer. Take the following steps to reduce your risk and to catch any cancer development as early as possible. Breast Cancer  Practice breast self-awareness.  This means understanding how your breasts normally appear and feel.  It also means doing regular breast self-exams. Let your health care provider know about any changes, no matter how small.  If you are 40 or older,  have a clinician do a breast exam (clinical breast exam or CBE) every year. Depending on your age, family history, and medical history, it may be recommended that you also have a yearly breast X-ray (mammogram).  If you  have a family history of breast cancer, talk with your health care provider about genetic screening.  If you are at high risk for breast cancer, talk with your health care provider about having an MRI and a mammogram every year.  Breast cancer (BRCA) gene test is recommended for women who have family members with BRCA-related cancers. Results of the assessment will determine the need for genetic counseling and BRCA1 and for BRCA2 testing. BRCA-related cancers include these types:  Breast. This occurs in males or females.  Ovarian.  Tubal. This may also be called fallopian tube cancer.  Cancer of the abdominal or pelvic lining (peritoneal cancer).  Prostate.  Pancreatic. Cervical, Uterine, and Ovarian Cancer  Your health care provider may recommend that you be screened regularly for cancer of the pelvic organs. These include your ovaries, uterus, and vagina. This screening involves a pelvic exam, which includes checking for microscopic changes to the surface of your cervix (Pap test).  For women ages 21-65, health care providers may recommend a pelvic exam and a Pap test every three years. For women ages 23-65, they may recommend the Pap test and pelvic exam, combined with testing for human papilloma virus (HPV), every five years. Some types of HPV increase your risk of cervical cancer. Testing for HPV may also be done on women of any age who have unclear Pap test results.  Other health care providers may not recommend any screening for nonpregnant women who are considered low risk for pelvic cancer and have no symptoms. Ask your health care provider if a screening pelvic exam is right for you.  If you have had past treatment for cervical cancer or a condition that could lead to cancer, you need Pap tests and screening for cancer for at least 20 years after your treatment. If Pap tests have been discontinued for you, your risk factors (such as having a new sexual partner) need to be reassessed  to determine if you should start having screenings again. Some women have medical problems that increase the chance of getting cervical cancer. In these cases, your health care provider may recommend that you have screening and Pap tests more often.  If you have a family history of uterine cancer or ovarian cancer, talk with your health care provider about genetic screening.  If you have vaginal bleeding after reaching menopause, tell your health care provider.  There are currently no reliable tests available to screen for ovarian cancer. Lung Cancer  Lung cancer screening is recommended for adults 99-83 years old who are at high risk for lung cancer because of a history of smoking. A yearly low-dose CT scan of the lungs is recommended if you:  Currently smoke.  Have a history of at least 30 pack-years of smoking and you currently smoke or have quit within the past 15 years. A pack-year is smoking an average of one pack of cigarettes per day for one year. Yearly screening should:  Continue until it has been 15 years since you quit.  Stop if you develop a health problem that would prevent you from having lung cancer treatment. Colorectal Cancer  This type of cancer can be detected and can often be prevented.  Routine colorectal cancer screening usually begins at age 72 and continues  through age 75.  If you have risk factors for colon cancer, your health care provider may recommend that you be screened at an earlier age.  If you have a family history of colorectal cancer, talk with your health care provider about genetic screening.  Your health care provider may also recommend using home test kits to check for hidden blood in your stool.  A small camera at the end of a tube can be used to examine your colon directly (sigmoidoscopy or colonoscopy). This is done to check for the earliest forms of colorectal cancer.  Direct examination of the colon should be repeated every 5-10 years until  age 75. However, if early forms of precancerous polyps or small growths are found or if you have a family history or genetic risk for colorectal cancer, you may need to be screened more often. Skin Cancer  Check your skin from head to toe regularly.  Monitor any moles. Be sure to tell your health care provider:  About any new moles or changes in moles, especially if there is a change in a mole's shape or color.  If you have a mole that is larger than the size of a pencil eraser.  If any of your family members has a history of skin cancer, especially at a young age, talk with your health care provider about genetic screening.  Always use sunscreen. Apply sunscreen liberally and repeatedly throughout the day.  Whenever you are outside, protect yourself by wearing long sleeves, pants, a wide-brimmed hat, and sunglasses. What should I know about osteoporosis? Osteoporosis is a condition in which bone destruction happens more quickly than new bone creation. After menopause, you may be at an increased risk for osteoporosis. To help prevent osteoporosis or the bone fractures that can happen because of osteoporosis, the following is recommended:  If you are 19-50 years old, get at least 1,000 mg of calcium and at least 600 mg of vitamin D per day.  If you are older than age 50 but younger than age 70, get at least 1,200 mg of calcium and at least 600 mg of vitamin D per day.  If you are older than age 70, get at least 1,200 mg of calcium and at least 800 mg of vitamin D per day. Smoking and excessive alcohol intake increase the risk of osteoporosis. Eat foods that are rich in calcium and vitamin D, and do weight-bearing exercises several times each week as directed by your health care provider. What should I know about how menopause affects my mental health? Depression may occur at any age, but it is more common as you become older. Common symptoms of depression include:  Low or sad  mood.  Changes in sleep patterns.  Changes in appetite or eating patterns.  Feeling an overall lack of motivation or enjoyment of activities that you previously enjoyed.  Frequent crying spells. Talk with your health care provider if you think that you are experiencing depression. What should I know about immunizations? It is important that you get and maintain your immunizations. These include:  Tetanus, diphtheria, and pertussis (Tdap) booster vaccine.  Influenza every year before the flu season begins.  Pneumonia vaccine.  Shingles vaccine. Your health care provider may also recommend other immunizations. This information is not intended to replace advice given to you by your health care provider. Make sure you discuss any questions you have with your health care provider. Document Released: 05/30/2005 Document Revised: 10/26/2015 Document Reviewed: 01/09/2015 Elsevier Interactive Patient   Education  2017 [employer] Carbohydrate Counting for Diabetes Mellitus, Adult Carbohydrate counting is a method for keeping track of how many carbohydrates you eat. Eating carbohydrates naturally increases the amount of sugar (glucose) in the blood. Counting how many carbohydrates you eat helps keep your blood glucose within normal limits, which helps you manage your diabetes (diabetes mellitus). It is important to know how many carbohydrates you can safely have in each meal. This is different for every person. A diet and nutrition specialist (registered dietitian) can help you make a meal plan and calculate how many carbohydrates you should have at each meal and snack. Carbohydrates are found in the following foods:  Grains, such as breads and cereals.  Dried beans and soy products.  Starchy vegetables, such as potatoes, peas, and corn.  Fruit and fruit juices.  Milk and yogurt.  Sweets and snack foods, such as cake, cookies, candy, chips, and soft drinks. How do I count  carbohydrates? There are two ways to count carbohydrates in food. You can use either of the methods or a combination of both. Reading "Nutrition Facts" on packaged food  The "Nutrition Facts" list is included on the labels of almost all packaged foods and beverages in the [place] It includes:  The serving size.  Information about nutrients in each serving, including the grams (g) of carbohydrate per serving. To use the "Nutrition Facts":  Decide how many servings you will have.  Multiply the number of servings by the number of carbohydrates per serving.  The resulting number is the total amount of carbohydrates that you will be having. Learning standard serving sizes of other foods  When you eat foods containing carbohydrates that are not packaged or do not include "Nutrition Facts" on the label, you need to measure the servings in order to count the amount of carbohydrates:  Measure the foods that you will eat with a food scale or measuring cup, if needed.  Decide how many standard-size servings you will eat.  Multiply the number of servings by 15. Most carbohydrate-rich foods have about 15 g of carbohydrates per serving.  For example, if you eat 8 oz (170 g) of strawberries, you will have eaten 2 servings and 30 g of carbohydrates (2 servings x 15 g = 30 g).  For foods that have more than one food mixed, such as soups and casseroles, you must count the carbohydrates in each food that is included. The following list contains standard serving sizes of common carbohydrate-rich foods. Each of these servings has about 15 g of carbohydrates:   hamburger bun or  English muffin.   oz (15 mL) syrup.   oz (14 g) jelly.  1 slice of bread.  1 six-inch tortilla.  3 oz (85 g) cooked rice or pasta.  4 oz (113 g) cooked dried beans.  4 oz (113 g) starchy vegetable, such as peas, corn, or potatoes.  4 oz (113 g) hot cereal.  4 oz (113 g) mashed potatoes or  of a large baked  potato.  4 oz (113 g) canned or frozen fruit.  4 oz (120 mL) fruit juice.  4-6 crackers.  6 chicken nuggets.  6 oz (170 g) unsweetened dry cereal.  6 oz (170 g) plain fat-free yogurt or yogurt sweetened with artificial sweeteners.  8 oz (240 mL) milk.  8 oz (170 g) fresh fruit or one small piece of fruit.  24 oz (680 g) popped popcorn. Example of carbohydrate counting Sample meal   3 oz (  85 g) chicken breast.  6 oz (170 g) brown rice.  4 oz (113 g) corn.  8 oz (240 mL) milk.  8 oz (170 g) strawberries with sugar-free whipped topping. Carbohydrate calculation  1. Identify the foods that contain carbohydrates:  Rice.  Corn.  Milk.  Strawberries. 2. Calculate how many servings you have of each food:  2 servings rice.  1 serving corn.  1 serving milk.  1 serving strawberries. 3. Multiply each number of servings by 15 g:  2 servings rice x 15 g = 30 g.  1 serving corn x 15 g = 15 g.  1 serving milk x 15 g = 15 g.  1 serving strawberries x 15 g = 15 g. 4. Add together all of the amounts to find the total grams of carbohydrates eaten:  30 g + 15 g + 15 g + 15 g = 75 g of carbohydrates total. This information is not intended to replace advice given to you by your health care provider. Make sure you discuss any questions you have with your health care provider. Document Released: 04/07/2005 Document Revised: 10/26/2015 Document Reviewed: 09/19/2015 Elsevier Interactive Patient Education  2017 [employer]  

## 2016-06-19 NOTE — Progress Notes (Signed)
Tiffany Gallagher 04/03/1966 [id number]    History:    Presents for annual exam. 4 days ago closed nondisplaced fracture fifth metatarsal bone of right foot occurred  while dancing at [place], walking on crutches with a walking boot.  1998 TVH for uterine fibroids, no HRT, mild menopausal symptoms (hot flashes and mood swings). Continues to smoke about half a pack per day, diet fairly healthy for breakfast and lunch but struggles to eat healthy at dinner. Light exercise. HTN, HLD managed by PCP. GERD managed by GI. Occasional HSV1 outbreaks, takes Valtrex when needed. Hx of nml paps and mammograms. Complains of vaginal/vulva itching and odor. Same partner, negative STD screen.  Past medical history, past surgical history, family history and social history were all reviewed and documented in the EPIC chart. Plans on quitting current job down the road, increased stress. 1 son.  ROS:  A ROS was performed and pertinent positives and negatives are included.  Exam:  Vitals:   06/19/16 0904  BP: 132/86  Weight: 300 lb (136.1 kg)  Height: 5\' 6"  (1.676 m)   Body mass index is 48.42 kg/m.   General appearance:  Normal Thyroid:  Symmetrical, normal in size, without palpable masses or nodularity. Respiratory  Auscultation:  Clear without wheezing or rhonchi Cardiovascular  Auscultation:  Regular rate, without rubs, murmurs or gallops  Edema/varicosities:  Not grossly evident Abdominal  Soft,nontender, without masses, guarding or rebound. Distention due to obesity.  Liver/spleen:  No organomegaly noted  Hernia:  None appreciated  Skin  Inspection:  Grossly normal   Breasts: Examined lying and sitting.     Right: Without masses, retractions, discharge or axillary adenopathy.     Left: Without masses, retractions, discharge or axillary adenopathy. Gentitourinary   Inguinal/mons:  Normal without inguinal adenopathy  External genitalia:  Normal  BUS/Urethra/Skene's glands:  Normal  Vagina:   Normal, wet prep negative with few bacteria  Cervix: and uterus absent (TVH)  Adnexa/parametria:     Rt: Without masses or tenderness.   Lt: Without masses or tenderness.  Anus and perineum: Normal  Assessment/Plan:  51 y.o. SBF G1P1 for annual exam.   Vaginal itching with normal wet prep TVH- fibroids on no HRT HTN/hyperlipidemia- labs and meds managed by PCP GERD- managed by GI Smoker- 1/2 pack per day HSV 1- Valtrex prn Morbid obesity Closed nondisplaced fracture of fifth metatarsal bone of right foot -walking boot -  Has follow-up scheduled  Plan: Counseled on importance of healthier eating, increased exercise and weight loss. Encouraged to reduce or quit smoking and reviewed benefits and harms of smoking. Continue annual 3D mammograms, multivitamins, Valtrex 500 PO bid for 3-5 days prn. Encouraged condoms during active outbreaks. Home safety, fall prevention discussed. Menopause reviewed.  Yeast prevention discussed, encouraged no underwear to bed. Instructed to call if she has any questions or concerns.  Huel Cote [facility], 9:44 AM 06/19/2016

## 2016-07-02 DIAGNOSIS — S92354D Nondisplaced fracture of fifth metatarsal bone, right foot, subsequent encounter for fracture with routine healing: Secondary | ICD-10-CM | POA: Diagnosis not present

## 2016-07-02 DIAGNOSIS — E559 Vitamin D deficiency, unspecified: Secondary | ICD-10-CM | POA: Diagnosis not present

## 2016-07-09 DIAGNOSIS — G4733 Obstructive sleep apnea (adult) (pediatric): Secondary | ICD-10-CM | POA: Diagnosis not present

## 2016-07-28 ENCOUNTER — Encounter: Payer: Self-pay | Admitting: Family Medicine

## 2016-07-29 ENCOUNTER — Other Ambulatory Visit: Payer: Self-pay | Admitting: Family Medicine

## 2016-07-29 DIAGNOSIS — K2100 Gastro-esophageal reflux disease with esophagitis, without bleeding: Secondary | ICD-10-CM

## 2016-07-29 MED ORDER — OMEPRAZOLE 20 MG PO CPDR: 20.0000 mg | DELAYED_RELEASE_CAPSULE | Freq: Every day | ORAL | 3 refills | Status: DC

## 2016-07-30 DIAGNOSIS — E559 Vitamin D deficiency, unspecified: Secondary | ICD-10-CM | POA: Diagnosis not present

## 2016-07-30 DIAGNOSIS — S92354D Nondisplaced fracture of fifth metatarsal bone, right foot, subsequent encounter for fracture with routine healing: Secondary | ICD-10-CM | POA: Diagnosis not present

## 2016-08-01 DIAGNOSIS — Z780 Asymptomatic menopausal state: Secondary | ICD-10-CM | POA: Diagnosis not present

## 2016-08-05 ENCOUNTER — Other Ambulatory Visit: Payer: Self-pay | Admitting: Family Medicine

## 2016-08-15 DIAGNOSIS — S92354K Nondisplaced fracture of fifth metatarsal bone, right foot, subsequent encounter for fracture with nonunion: Secondary | ICD-10-CM | POA: Diagnosis not present

## 2016-08-20 DIAGNOSIS — E559 Vitamin D deficiency, unspecified: Secondary | ICD-10-CM | POA: Diagnosis not present

## 2016-08-20 DIAGNOSIS — S92354D Nondisplaced fracture of fifth metatarsal bone, right foot, subsequent encounter for fracture with routine healing: Secondary | ICD-10-CM | POA: Diagnosis not present

## 2016-09-03 ENCOUNTER — Encounter: Payer: Self-pay | Admitting: Gynecology

## 2016-09-17 DIAGNOSIS — E559 Vitamin D deficiency, unspecified: Secondary | ICD-10-CM | POA: Diagnosis not present

## 2016-09-17 DIAGNOSIS — S92354D Nondisplaced fracture of fifth metatarsal bone, right foot, subsequent encounter for fracture with routine healing: Secondary | ICD-10-CM | POA: Diagnosis not present

## 2016-10-01 DIAGNOSIS — E559 Vitamin D deficiency, unspecified: Secondary | ICD-10-CM | POA: Diagnosis not present

## 2016-10-01 DIAGNOSIS — S92354D Nondisplaced fracture of fifth metatarsal bone, right foot, subsequent encounter for fracture with routine healing: Secondary | ICD-10-CM | POA: Diagnosis not present

## 2016-11-12 DIAGNOSIS — S92354D Nondisplaced fracture of fifth metatarsal bone, right foot, subsequent encounter for fracture with routine healing: Secondary | ICD-10-CM | POA: Diagnosis not present

## 2016-11-27 ENCOUNTER — Encounter: Payer: Self-pay | Admitting: Medical

## 2016-11-27 ENCOUNTER — Ambulatory Visit (INDEPENDENT_AMBULATORY_CARE_PROVIDER_SITE_OTHER): Payer: BLUE CROSS/BLUE SHIELD | Admitting: Medical

## 2016-11-27 VITALS — BP 162/85 | HR 85 | Temp 98.8°F | Resp 16 | Ht 66.0 in | Wt 307.4 lb

## 2016-11-27 DIAGNOSIS — J329 Chronic sinusitis, unspecified: Secondary | ICD-10-CM | POA: Diagnosis not present

## 2016-11-27 DIAGNOSIS — H9202 Otalgia, left ear: Secondary | ICD-10-CM

## 2016-11-27 DIAGNOSIS — R05 Cough: Secondary | ICD-10-CM | POA: Diagnosis not present

## 2016-11-27 DIAGNOSIS — R059 Cough, unspecified: Secondary | ICD-10-CM

## 2016-11-27 MED ORDER — FLUTICASONE PROPIONATE 50 MCG/ACT NA SUSP: 2.0000 | Freq: Every day | NASAL | 1 refills | Status: DC

## 2016-11-27 MED ORDER — AMOXICILLIN-POT CLAVULANATE 875-125 MG PO TABS
1.0000 | ORAL_TABLET | Freq: Two times a day (BID) | ORAL | 0 refills | Status: DC
Start: 2016-11-27 — End: 2017-06-09

## 2016-11-27 MED ORDER — BENZONATATE 100 MG PO CAPS: 100.0000 mg | ORAL_CAPSULE | Freq: Three times a day (TID) | ORAL | 0 refills | Status: DC | PRN

## 2016-11-27 MED ORDER — FLUCONAZOLE 150 MG PO TABS
ORAL_TABLET | ORAL | 1 refills | Status: DC
Start: 2016-11-27 — End: 2017-06-09

## 2016-11-27 MED ORDER — ALBUTEROL SULFATE HFA 108 (90 BASE) MCG/ACT IN AERS: 2.0000 | INHALATION_SPRAY | Freq: Four times a day (QID) | RESPIRATORY_TRACT | 0 refills | Status: DC | PRN

## 2016-11-27 NOTE — Progress Notes (Signed)
Subjective:    Patient ID: Tiffany Gallagher, female    DOB: 05-Jan-1966, 51 y.o.   MRN: [id number]  HPI  Pt in states some sinus pressure, st, and ear pain. Pt states symptoms present for 2 weeks. She has tried to use otc sinus meds but not getting better.   Pt bp mild high initially. She has bp cuff at home. Her highest recently was 149/79. Later checked and dropped after rested 120/70. Rushed to make appointment today.   Review of Systems  Constitutional: Negative for chills, fatigue and fever.  HENT: Positive for congestion, ear pain, postnasal drip, sinus pain, sinus pressure, sneezing and sore throat.   Respiratory: Positive for cough and wheezing. Negative for apnea and shortness of breath.        Mild cough. Occasionally productive.  Fredrich Birks thought she might be wheezing.  Cardiovascular: Negative for chest pain and palpitations.  Gastrointestinal: Negative for abdominal pain and blood in stool.  Musculoskeletal: Negative for back pain.  Skin: Negative for rash.  Neurological: Positive for dizziness. Negative for syncope, facial asymmetry, speech difficulty, weakness, numbness and headaches.       Mild transient dizziness.  Hematological: Negative for adenopathy. Does not bruise/bleed easily.  Psychiatric/Behavioral: Negative for behavioral problems and confusion.    Past Medical History:  Diagnosis Date  . Allergy   . Fibroid   . Hyperlipidemia   . Hypertension   . Migraines   . Obesity, morbid, BMI 40.0-49.9 ([place])   . Sleep apnea    cpap     Social History   Social History  . Marital status: Divorced    Spouse name: N/A  . Number of children: 1  . Years of education: 80   Occupational History  . customer service     Social History Main Topics  . Smoking status: Current Every Day Smoker    Packs/day: 0.50    Years: 30.00    Types: Cigarettes  . Smokeless tobacco: Never Used     Comment: cutting back  . Alcohol use 0.0 oz/week     Comment: rarely; once  yearly  . Drug use: No  . Sexual activity: Yes    Partners: Male    Birth control/ protection: Surgical, Condom   Other Topics Concern  . Not on file   Social History Narrative   Exercise-- no    Past Surgical History:  Procedure Laterality Date  . bone spur     removed  . CESAREAN SECTION  1995  . VAGINAL HYSTERECTOMY  1998   partial    Family History  Problem Relation Age of Onset  . Coronary artery disease Mother   . Hypertension Mother   . Kidney disease Mother 67       dialysis  . Aneurysm Mother        brain  . Stroke Mother   . Heart disease Mother        MI  . Cancer Father 18       brain cancer  . Diabetes Sister   . Hyperlipidemia Sister   . Hypertension Sister   . Arthritis Brother   . Hypertension Brother   . Diabetes Brother   . Hypertension Brother   . Hypertension Sister     Allergies  Allergen Reactions  . Lactose Intolerance (Gi)   . Lipitor [Atorvastatin Calcium]     Nerves, muscle spasms, light stool    Current Outpatient Prescriptions on File Prior to Visit  Medication Sig Dispense  Refill  . albuterol (PROVENTIL HFA;VENTOLIN HFA) 108 (90 Base) MCG/ACT inhaler Inhale 2 puffs into the lungs every 6 (six) hours as needed for wheezing or shortness of breath. 1 Inhaler 0  . Ascorbic Acid (VITAMIN C) 100 MG tablet Take 100 mg by mouth daily.    . benazepril (LOTENSIN) 10 MG tablet Take 1 tablet (10 mg total) by mouth daily. 90 tablet 0  . benzonatate (TESSALON) 100 MG capsule Take 1 capsule (100 mg total) by mouth 3 (three) times daily as needed for cough. 21 capsule 0  . cholecalciferol (VITAMIN D) 1000 units tablet Take 1,000 Units by mouth daily.    . cyclobenzaprine (FLEXERIL) 10 MG tablet Take 1 tablet (10 mg total) by mouth 2 (two) times daily as needed for muscle spasms. 30 tablet 0  . fluticasone (FLONASE) 50 MCG/ACT nasal spray Place 2 sprays into both nostrils daily. 16 g 1  . hydrochlorothiazide (HYDRODIURIL) 25 MG tablet Take 1  tablet (25 mg total) by mouth daily. 90 tablet 1  . loratadine (CLARITIN) 10 MG tablet Take 10 mg by mouth daily.    . meloxicam (MOBIC) 15 MG tablet Take 1 tablet (15 mg total) by mouth daily. 30 tablet 0  . Multiple Vitamin (MULTIVITAMIN WITH MINERALS) TABS Take 1 tablet by mouth daily.    Marland Kitchen omeprazole (PRILOSEC) 20 MG capsule Take 1 capsule (20 mg total) by mouth daily. 90 capsule 3  . OVER THE COUNTER MEDICATION Beano    . Potassium 99 MG TABS Take by mouth daily as needed.    . thiamine (VITAMIN B-1) 100 MG tablet Take 100 mg by mouth daily.    . valACYclovir (VALTREX) 500 MG tablet Take twice daily for 3-5 days as needed 90 tablet 4   Current Facility-Administered Medications on File Prior to Visit  Medication Dose Route Frequency Provider Last Rate Last Dose  . 0.9 %  sodium chloride infusion  500 mL Intravenous Continuous Milus Banister, MD        BP (!) 162/85   Pulse 85   Temp 98.8 F (37.1 C) (Oral)   Resp 16   Ht 5\' 6"  (1.676 m)   Wt (!) 307 lb 6.4 oz (139.4 kg)   SpO2 100%   BMI 49.62 kg/m       Objective:   Physical Exam  General  Mental Status - Alert. General Appearance - Well groomed. Not in acute distress.  Skin Rashes- No Rashes.  HEENT Head- Normal. Ear Auditory Canal - Left- Normal. Right - Normal.Tympanic Membrane- Left- Normal. Right- Normal. Eye Sclera/Conjunctiva- Left- Normal. Right- Normal. Nose & Sinuses Nasal Mucosa- Left-  Boggy and Congested. Right-  Boggy and  Congested.Bilateral maxillary(more maxillary) and frontal sinus pressure. Mouth & Throat Lips: Upper Lip- Normal: no dryness, cracking, pallor, cyanosis, or vesicular eruption. Lower Lip-Normal: no dryness, cracking, pallor, cyanosis or vesicular eruption. Buccal Mucosa- Bilateral- No Aphthous ulcers. Oropharynx- No Discharge or Erythema. Tonsils: Characteristics- Bilateral- No Erythema or Congestion. Size/Enlargement- Bilateral- No enlargement. Discharge-  bilateral-None.  Neck Neck- Supple. No Masses.   Chest and Lung Exam Auscultation: Breath Sounds:-Clear even and unlabored.  Cardiovascular Auscultation:Rythm- Regular, rate and rhythm. Murmurs & Other Heart Sounds:Ausculatation of the heart reveal- No Murmurs.  Lymphatic Head & Neck General Head & Neck Lymphatics: Bilateral: Description- No Localized lymphadenopathy.    Neurologic Cranial Nerve exam:- CN III-XII intact(No nystagmus), symmetric smile. Strength:- 5/5 equal and symmetric strength both upper and lower extremities.  Assessment & Plan:  You appear to have a sinus infection. I am prescribing augmentin antibiotic for the infection. To help with the nasal congestion I prescribed flonase nasal steroid. For your associated cough, I prescribed cough medicine benzonatate.  Rest, hydrate, tylenol for fever.  For any wheezing use our albuterol inhaler.If using frequently lest [place] know.  If any yeast infection occurs use diflucan.  Follow up in 7 days or as needed.

## 2016-11-27 NOTE — Patient Instructions (Addendum)
You appear to have a sinus infection. I am prescribing augmentin antibiotic for the infection. To help with the nasal congestion I prescribed flonase nasal steroid. For your associated cough, I prescribed cough medicine benzonatate.  Rest, hydrate, tylenol for fever.  For any wheezing use our albuterol inhaler.If using frequently lest [place] know.  If any yeast infection occurs use diflucan.  Follow up in 7 days or as needed.  Dizziness mild transient but keep checking bp to confirm not bp elevation related. If dizziness constant or worsens let us know.

## 2016-12-17 DIAGNOSIS — S92354D Nondisplaced fracture of fifth metatarsal bone, right foot, subsequent encounter for fracture with routine healing: Secondary | ICD-10-CM | POA: Diagnosis not present

## 2016-12-17 DIAGNOSIS — E559 Vitamin D deficiency, unspecified: Secondary | ICD-10-CM | POA: Diagnosis not present

## 2017-01-24 ENCOUNTER — Other Ambulatory Visit: Payer: Self-pay | Admitting: Family Medicine

## 2017-02-12 ENCOUNTER — Ambulatory Visit (INDEPENDENT_AMBULATORY_CARE_PROVIDER_SITE_OTHER): Payer: BLUE CROSS/BLUE SHIELD | Admitting: Family Medicine

## 2017-02-12 ENCOUNTER — Encounter: Payer: Self-pay | Admitting: Family Medicine

## 2017-02-12 VITALS — BP 116/70 | HR 67 | Temp 98.1°F | Ht 65.0 in | Wt 306.0 lb

## 2017-02-12 DIAGNOSIS — E559 Vitamin D deficiency, unspecified: Secondary | ICD-10-CM

## 2017-02-12 DIAGNOSIS — Z0000 Encounter for general adult medical examination without abnormal findings: Secondary | ICD-10-CM

## 2017-02-12 LAB — LIPID PANEL
CHOL/HDL RATIO: 5
Cholesterol: 196 mg/dL (ref 0–200)
HDL: 42.1 mg/dL (ref >39.00)
LDL Cholesterol: 129 mg/dL — ABNORMAL HIGH (ref 0–99)
NONHDL: 153.52
Triglycerides: 124 mg/dL (ref 0.0–149.0)
VLDL: 24.8 mg/dL (ref 0.0–40.0)

## 2017-02-12 LAB — COMPREHENSIVE METABOLIC PANEL
ALK PHOS: 57 U/L (ref 39–117)
ALT: 20 U/L (ref 0–35)
AST: 17 U/L (ref 0–37)
Albumin: 3.9 g/dL (ref 3.5–5.2)
BILIRUBIN TOTAL: 0.5 mg/dL (ref 0.2–1.2)
BUN: 14 mg/dL (ref 6–23)
CO2: 28 mEq/L (ref 19–32)
Calcium: 9.8 mg/dL (ref 8.4–10.5)
Chloride: 106 mEq/L (ref 96–112)
Creatinine, Ser: 0.56 mg/dL (ref 0.40–1.20)
GFR: 146.68 mL/min (ref >60.00)
GLUCOSE: 112 mg/dL — AB (ref 70–99)
Potassium: 3.8 mEq/L (ref 3.5–5.1)
SODIUM: 140 meq/L (ref 135–145)
TOTAL PROTEIN: 7.2 g/dL (ref 6.0–8.3)

## 2017-02-12 LAB — CBC WITH DIFFERENTIAL/PLATELET
BASOS ABS: 0 10*3/uL (ref 0.0–0.1)
Basophils Relative: 0.4 % (ref 0.0–3.0)
EOS ABS: 0.2 10*3/uL (ref 0.0–0.7)
Eosinophils Relative: 3.1 % (ref 0.0–5.0)
HCT: 37.7 % (ref 36.0–46.0)
Hemoglobin: 12.3 g/dL (ref 12.0–15.0)
LYMPHS ABS: 3 10*3/uL (ref 0.7–4.0)
Lymphocytes Relative: 42.6 % (ref 12.0–46.0)
MCHC: 32.6 g/dL (ref 30.0–36.0)
MCV: 96 fl (ref 78.0–100.0)
MONO ABS: 0.5 10*3/uL (ref 0.1–1.0)
Monocytes Relative: 6.6 % (ref 3.0–12.0)
NEUTROS PCT: 47.3 % (ref 43.0–77.0)
Neutro Abs: 3.3 10*3/uL (ref 1.4–7.7)
Platelets: 306 10*3/uL (ref 150.0–400.0)
RBC: 3.93 Mil/uL (ref 3.87–5.11)
RDW: 14.2 % (ref 11.5–15.5)
WBC: 6.9 10*3/uL (ref 4.0–10.5)

## 2017-02-12 LAB — TSH: TSH: 1.69 u[IU]/mL (ref 0.35–4.50)

## 2017-02-12 LAB — VITAMIN D 25 HYDROXY (VIT D DEFICIENCY, FRACTURES): VITD: 24.45 ng/mL — ABNORMAL LOW (ref 30.00–100.00)

## 2017-02-12 NOTE — Patient Instructions (Signed)
Preventive Care 40-64 Years, Female Preventive care refers to lifestyle choices and visits with your health care provider that can promote health and wellness. What does preventive care include?  A yearly physical exam. This is also called an annual well check.  Dental exams once or twice a year.  Routine eye exams. Ask your health care provider how often you should have your eyes checked.  Personal lifestyle choices, including: ? Daily care of your teeth and gums. ? Regular physical activity. ? Eating a healthy diet. ? Avoiding tobacco and drug use. ? Limiting alcohol use. ? Practicing safe sex. ? Taking low-dose aspirin daily starting at age 51. ? Taking vitamin and mineral supplements as recommended by your health care provider. What happens during an annual well check? The services and screenings done by your health care provider during your annual well check will depend on your age, overall health, lifestyle risk factors, and family history of disease. Counseling Your health care provider may ask you questions about your:  Alcohol use.  Tobacco use.  Drug use.  Emotional well-being.  Home and relationship well-being.  Sexual activity.  Eating habits.  Work and work Statistician.  Method of birth control.  Menstrual cycle.  Pregnancy history.  Screening You may have the following tests or measurements:  Height, weight, and BMI.  Blood pressure.  Lipid and cholesterol levels. These may be checked every 5 years, or more frequently if you are over 51 years old.  Skin check.  Lung cancer screening. You may have this screening every year starting at age 51 if you have a 30-pack-year history of smoking and currently smoke or have quit within the past 15 years.  Fecal occult blood test (FOBT) of the stool. You may have this test every year starting at age 51.  Flexible sigmoidoscopy or colonoscopy. You may have a sigmoidoscopy every 5 years or a colonoscopy  every 10 years starting at age 30.  Hepatitis C blood test.  Hepatitis B blood test.  Sexually transmitted disease (STD) testing.  Diabetes screening. This is done by checking your blood sugar (glucose) after you have not eaten for a while (fasting). You may have this done every 1-3 years.  Mammogram. This may be done every 1-2 years. Talk to your health care provider about when you should start having regular mammograms. This may depend on whether you have a family history of breast cancer.  BRCA-related cancer screening. This may be done if you have a family history of breast, ovarian, tubal, or peritoneal cancers.  Pelvic exam and Pap test. This may be done every 3 years starting at age 51. Starting at age 51, this may be done every 5 years if you have a Pap test in combination with an HPV test.  Bone density scan. This is done to screen for osteoporosis. You may have this scan if you are at high risk for osteoporosis.  Discuss your test results, treatment options, and if necessary, the need for more tests with your health care provider. Vaccines Your health care provider may recommend certain vaccines, such as:  Influenza vaccine. This is recommended every year.  Tetanus, diphtheria, and acellular pertussis (Tdap, Td) vaccine. You may need a Td booster every 10 years.  Varicella vaccine. You may need this if you have not been vaccinated.  Zoster vaccine. You may need this after age 5.  Measles, mumps, and rubella (MMR) vaccine. You may need at least one dose of MMR if you were born in  1957 or later. You may also need a second dose.  Pneumococcal 13-valent conjugate (PCV13) vaccine. You may need this if you have certain conditions and were not previously vaccinated.  Pneumococcal polysaccharide (PPSV23) vaccine. You may need one or two doses if you smoke cigarettes or if you have certain conditions.  Meningococcal vaccine. You may need this if you have certain  conditions.  Hepatitis A vaccine. You may need this if you have certain conditions or if you travel or work in places where you may be exposed to hepatitis A.  Hepatitis B vaccine. You may need this if you have certain conditions or if you travel or work in places where you may be exposed to hepatitis B.  Haemophilus influenzae type b (Hib) vaccine. You may need this if you have certain conditions.  Talk to your health care provider about which screenings and vaccines you need and how often you need them. This information is not intended to replace advice given to you by your health care provider. Make sure you discuss any questions you have with your health care provider. Document Released: 05/04/2015 Document Revised: 12/26/2015 Document Reviewed: 02/06/2015 Elsevier Interactive Patient Education  2017 [employer].

## 2017-02-12 NOTE — Progress Notes (Signed)
Subjective:     Tiffany Gallagher is a 51 y.o. female and is here for a comprehensive physical exam. The patient reports no problems.  Social History   Social History  . Marital status: Divorced    Spouse name: N/A  . Number of children: 1  . Years of education: 19   Occupational History  . customer service     Social History Main Topics  . Smoking status: Current Every Day Smoker    Packs/day: 0.50    Years: 30.00    Types: Cigarettes  . Smokeless tobacco: Never Used     Comment: cutting back  . Alcohol use 0.0 oz/week     Comment: rarely; once yearly  . Drug use: No  . Sexual activity: Yes    Partners: Male    Birth control/ protection: Surgical, Condom   Other Topics Concern  . Not on file   Social History Narrative   Exercise-- no   Health Maintenance  Topic Date Due  . INFLUENZA VACCINE  02/12/2018 (Originally 11/19/2016)  . MAMMOGRAM  04/18/2017  . TETANUS/TDAP  02/06/2018  . COLONOSCOPY  05/19/2026  . HIV Screening  Completed    The following portions of the patient's history were reviewed and updated as appropriate:  She  has a past medical history of Allergy; Fibroid; Hyperlipidemia; Hypertension; Migraines; Obesity, morbid, BMI 40.0-49.9 ([place]); and Sleep apnea. She  does not have any pertinent problems on file. She  has a past surgical history that includes Cesarean section (1995); Vaginal hysterectomy (1998); and bone spur. Her family history includes Aneurysm in her mother; Arthritis in her brother; Cancer (age of onset: 58) in her father; Coronary artery disease in her mother; Diabetes in her brother and sister; Heart disease in her mother; Hyperlipidemia in her sister; Hypertension in her brother, brother, mother, sister, and sister; Kidney disease (age of onset: 67) in her mother; Stroke in her mother. She  reports that she has been smoking Cigarettes.  She has a 15.00 pack-year smoking history. She has never used smokeless tobacco. She reports that she  drinks alcohol. She reports that she does not use drugs. She has a current medication list which includes the following prescription(s): acetaminophen, albuterol, amoxicillin-clavulanate, vitamin c, benazepril, benzonatate, cholecalciferol, cyclobenzaprine, fluconazole, fluticasone, hydrochlorothiazide, loratadine, meloxicam, omeprazole, OVER THE COUNTER MEDICATION, potassium, thiamine, tramadol, valacyclovir, and multivitamin with minerals, and the following Facility-Administered Medications: sodium chloride. Current Outpatient Prescriptions on File Prior to Visit  Medication Sig Dispense Refill  . albuterol (PROVENTIL HFA;VENTOLIN HFA) 108 (90 Base) MCG/ACT inhaler Inhale 2 puffs into the lungs every 6 (six) hours as needed for wheezing or shortness of breath. 1 Inhaler 0  . amoxicillin-clavulanate (AUGMENTIN) 875-125 MG tablet Take 1 tablet by mouth 2 (two) times daily. 20 tablet 0  . Ascorbic Acid (VITAMIN C) 100 MG tablet Take 100 mg by mouth daily.    . benazepril (LOTENSIN) 10 MG tablet TAKE 1 TABLET BY MOUTH  DAILY 90 tablet 0  . benzonatate (TESSALON) 100 MG capsule Take 1 capsule (100 mg total) by mouth 3 (three) times daily as needed for cough. 21 capsule 0  . cholecalciferol (VITAMIN D) 1000 units tablet Take 6,000 Units by mouth daily.     . cyclobenzaprine (FLEXERIL) 10 MG tablet Take 1 tablet (10 mg total) by mouth 2 (two) times daily as needed for muscle spasms. 30 tablet 0  . fluconazole (DIFLUCAN) 150 MG tablet 1 tab po as needed. Repeat if needed 1 tablet 1  .  fluticasone (FLONASE) 50 MCG/ACT nasal spray Place 2 sprays into both nostrils daily. 16 g 1  . hydrochlorothiazide (HYDRODIURIL) 25 MG tablet Take 1 tablet (25 mg total) by mouth daily. 90 tablet 1  . loratadine (CLARITIN) 10 MG tablet Take 10 mg by mouth daily.    . meloxicam (MOBIC) 15 MG tablet Take 1 tablet (15 mg total) by mouth daily. 30 tablet 0  . omeprazole (PRILOSEC) 20 MG capsule Take 1 capsule (20 mg total) by  mouth daily. 90 capsule 3  . OVER THE COUNTER MEDICATION Beano    . Potassium 99 MG TABS Take by mouth daily as needed.    . thiamine (VITAMIN B-1) 100 MG tablet Take 100 mg by mouth daily.    . valACYclovir (VALTREX) 500 MG tablet Take twice daily for 3-5 days as needed 90 tablet 4  . Multiple Vitamin (MULTIVITAMIN WITH MINERALS) TABS Take 1 tablet by mouth daily.     Current Facility-Administered Medications on File Prior to Visit  Medication Dose Route Frequency Provider Last Rate Last Dose  . 0.9 %  sodium chloride infusion  500 mL Intravenous Continuous Milus Banister, MD       She is allergic to lactose intolerance (gi) and lipitor [atorvastatin calcium]..  Review of Systems Review of Systems  Constitutional: Negative for activity change, appetite change and fatigue.  HENT: Negative for hearing loss, congestion, tinnitus and ear discharge.  dentist q62m Eyes: Negative for visual disturbance (see optho q1y -- vision corrected to 20/20 with glasses).  Respiratory: Negative for cough, chest tightness and shortness of breath.   Cardiovascular: Negative for chest pain, palpitations and leg swelling.  Gastrointestinal: Negative for abdominal pain, diarrhea, constipation and abdominal distention.  Genitourinary: Negative for urgency, frequency, decreased urine volume and difficulty urinating.  Musculoskeletal: Negative for back pain, arthralgias and gait problem.  Skin: Negative for color change, pallor and rash.  Neurological: Negative for dizziness, light-headedness, numbness and headaches.  Hematological: Negative for adenopathy. Does not bruise/bleed easily.  Psychiatric/Behavioral: Negative for suicidal ideas, confusion, sleep disturbance, self-injury, dysphoric mood, decreased concentration and agitation.       Objective:    BP 116/70   Pulse 67   Temp 98.1 F (36.7 C) (Oral)   Ht 5\' 5"  (1.651 m)   Wt (!) 306 lb (138.8 kg)   SpO2 98%   BMI 50.92 kg/m  General  appearance: alert, cooperative, appears stated age and no distress Head: Normocephalic, without obvious abnormality, atraumatic Eyes: conjunctivae/corneas clear. PERRL, EOM's intact. Fundi benign. Ears: normal TM's and external ear canals both ears Nose: Nares normal. Septum midline. Mucosa normal. No drainage or sinus tenderness. Throat: lips, mucosa, and tongue normal; teeth and gums normal Neck: no adenopathy, no carotid bruit, no JVD, supple, symmetrical, trachea midline and thyroid not enlarged, symmetric, no tenderness/mass/nodules Back: symmetric, no curvature. ROM normal. No CVA tenderness. Lungs: clear to auscultation bilaterally Breasts: gyn Heart: regular rate and rhythm, S1, S2 normal, no murmur, click, rub or gallop Abdomen: soft, non-tender; bowel sounds normal; no masses,  no organomegaly Pelvic: deferred--gyn Extremities: extremities normal, atraumatic, no cyanosis or edema Pulses: 2+ and symmetric Skin: Skin color, texture, turgor normal. No rashes or lesions Lymph nodes: Cervical, supraclavicular, and axillary nodes normal. Neurologic: Alert and oriented X 3, normal strength and tone. Normal symmetric reflexes. Normal coordination and gait    Assessment:    Healthy female exam.      Plan:    ghm utd Check labs  See After  Visit Summary for Counseling Recommendations    1. Vitamin D deficiency  - Vitamin D (25 hydroxy)  2. Preventative health care See above - TSH - Lipid panel - CBC with Differential/Platelet - Comprehensive metabolic panel - Vitamin D (25 hydroxy)

## 2017-02-16 ENCOUNTER — Other Ambulatory Visit: Payer: Self-pay

## 2017-02-16 ENCOUNTER — Telehealth: Payer: Self-pay

## 2017-02-16 MED ORDER — VITAMIN D (ERGOCALCIFEROL) 1.25 MG (50000 UNIT) PO CAPS: 50000.0000 [IU] | ORAL_CAPSULE | ORAL | 2 refills | Status: DC

## 2017-02-16 NOTE — Telephone Encounter (Signed)
Pt called and informed of low Vitamin D levels. Advised Pt tp continue to take her OTC Vitamin D as well as begin 50000u Rx as advised by Dr. Abbott Pao stated compliance and asked that Rx be sent to mail pharmacy. Sent in as requested.

## 2017-02-19 ENCOUNTER — Other Ambulatory Visit: Payer: Self-pay | Admitting: Family Medicine

## 2017-02-19 DIAGNOSIS — E559 Vitamin D deficiency, unspecified: Secondary | ICD-10-CM

## 2017-04-03 ENCOUNTER — Telehealth: Payer: Self-pay | Admitting: Family Medicine

## 2017-04-03 DIAGNOSIS — S92354A Nondisplaced fracture of fifth metatarsal bone, right foot, initial encounter for closed fracture: Secondary | ICD-10-CM | POA: Diagnosis not present

## 2017-04-03 DIAGNOSIS — S92515A Nondisplaced fracture of proximal phalanx of left lesser toe(s), initial encounter for closed fracture: Secondary | ICD-10-CM | POA: Diagnosis not present

## 2017-04-03 NOTE — Telephone Encounter (Signed)
Medication needs provider approval. Thanks. 

## 2017-04-03 NOTE — Telephone Encounter (Signed)
Copied from [place]. Topic: Quick Communication - See Telephone Encounter >> Apr 03, 2017  1:42 PM Hewitt Shorts wrote: CRM for notification. See Telephone encounter for:  Pt is wanting an rx for vitamin D 90 day supply sent to mail order pharmacy best number [phone] 04/03/17.

## 2017-04-03 NOTE — Telephone Encounter (Signed)
Spoke with pt. Previous Vit D rx went to [facility]. Pharmacy list updated.  Pt states she no longer uses [facility]. Uses [facility] but Gyn sent vitamin D rx for her today. Scheduled pt lab appt to repeat vit D for 06/26/16.

## 2017-04-16 ENCOUNTER — Other Ambulatory Visit: Payer: Self-pay | Admitting: Family Medicine

## 2017-06-04 ENCOUNTER — Telehealth: Payer: Self-pay | Admitting: Pulmonary Disease

## 2017-06-04 DIAGNOSIS — G4733 Obstructive sleep apnea (adult) (pediatric): Secondary | ICD-10-CM

## 2017-06-04 NOTE — Telephone Encounter (Signed)
ATC pt, no answer. Left message for pt to call back.  Order already placed for supplies.  Patient Instructions by Melvenia Needles, NP at 06/05/2016 9:15 AM   Author: Melvenia Needles, NP Author Type: Nurse Practitioner Filed: 06/05/2016 9:53 AM  Note Status: Signed Cosign: Cosign Not Required Encounter Date: 06/05/2016  Editor: Melvenia Needles, NP (Nurse Practitioner)    Keep up good work on CPAP At bedtime   Order for new nasal pillow mask.  Stay active and work on weight loss.  Do not drive if sleepy.  Follow up with Dr. Elsworth Soho  In  1 year and As needed       Instructions      Return in about 1 year (around 06/05/2017) for Follow up with Dr. Elsworth Soho.  Keep up good work on CPAP At bedtime   Order for new nasal pillow mask.  Stay active and work on weight loss.  Do not drive if sleepy.  Follow up with Dr. Elsworth Soho  In  1 year and As needed

## 2017-06-04 NOTE — Telephone Encounter (Signed)
Pt called back, made an Appt with TP. Nothing further is needed.

## 2017-06-08 ENCOUNTER — Telehealth: Payer: Self-pay | Admitting: Family Medicine

## 2017-06-08 ENCOUNTER — Other Ambulatory Visit: Payer: Self-pay | Admitting: Family Medicine

## 2017-06-08 DIAGNOSIS — Z139 Encounter for screening, unspecified: Secondary | ICD-10-CM

## 2017-06-08 NOTE — Telephone Encounter (Signed)
Copied from [place] [id number]. Topic: Referral - Request >> Jun 08, 2017 11:09 AM Robina Ade, Helene Kelp D wrote: Reason for CRM: Patient called stating that she needs a referral to have her mammogram done. She called to schedule it but they told her that she needs a different type of mammogram from Dr. Etter Sjogren which she doesn't know why. She would like to talk to the provider or her CMA about this. Please call patient back, thanks.

## 2017-06-09 ENCOUNTER — Other Ambulatory Visit: Payer: Self-pay | Admitting: *Deleted

## 2017-06-09 ENCOUNTER — Encounter: Payer: Self-pay | Admitting: Family Medicine

## 2017-06-09 ENCOUNTER — Ambulatory Visit: Payer: BLUE CROSS/BLUE SHIELD | Admitting: Family Medicine

## 2017-06-09 VITALS — BP 138/82 | HR 80 | Temp 99.0°F | Resp 16 | Ht 65.0 in | Wt 300.2 lb

## 2017-06-09 DIAGNOSIS — R05 Cough: Secondary | ICD-10-CM | POA: Diagnosis not present

## 2017-06-09 DIAGNOSIS — R059 Cough, unspecified: Secondary | ICD-10-CM

## 2017-06-09 DIAGNOSIS — J0140 Acute pansinusitis, unspecified: Secondary | ICD-10-CM

## 2017-06-09 DIAGNOSIS — L989 Disorder of the skin and subcutaneous tissue, unspecified: Secondary | ICD-10-CM

## 2017-06-09 MED ORDER — FLUCONAZOLE 150 MG PO TABS: ORAL_TABLET | ORAL | 0 refills | Status: DC

## 2017-06-09 MED ORDER — PROMETHAZINE-DM 6.25-15 MG/5ML PO SYRP: 5.0000 mL | ORAL_SOLUTION | Freq: Four times a day (QID) | ORAL | 0 refills | Status: DC | PRN

## 2017-06-09 MED ORDER — FLUTICASONE PROPIONATE 50 MCG/ACT NA SUSP: 2.0000 | Freq: Every day | NASAL | 6 refills | Status: DC

## 2017-06-09 MED ORDER — DOXYCYCLINE HYCLATE 100 MG PO TABS: 100.0000 mg | ORAL_TABLET | Freq: Two times a day (BID) | ORAL | 0 refills | Status: DC

## 2017-06-09 NOTE — Patient Instructions (Signed)

## 2017-06-09 NOTE — Progress Notes (Addendum)
Patient ID: Tiffany Gallagher, female   DOB: 1966-01-09, 52 y.o.   MRN: [id number]    Subjective:  I acted as a Education administrator for Dr. Carollee Herter.  Guerry Bruin, [place]   Patient ID: Tiffany Gallagher, female    DOB: [birth year], 52 y.o.   MRN: [id number]  Chief Complaint  Patient presents with  . Sinusitis    Sinusitis  This is a new problem. Episode onset: sunday. Associated symptoms include congestion, ear pain, headaches, a hoarse voice, shortness of breath, sinus pressure and a sore throat. Pertinent negatives include no coughing. Past treatments include spray decongestants (nasal spray, mucinex, ).   pt also c/o bumps on R low ext and she is concerned about blood clot-- although spots are gone.   She is also concerned about  HSV 1 in genital area.   Patient is in today for sinusitis.  Patient Care Team: Carollee Herter, Alferd Apa, DO as PCP - General Layne Benton Eustace Quail, MD as Consulting Physician (Sports Medicine) Rigoberto Noel, MD as Consulting Physician (Pulmonary Disease) Milus Banister, MD as Attending Physician (Gastroenterology) Huel Cote, NP as Nurse Practitioner (Obstetrics and Gynecology) Renda Rolls Jennefer Bravo, MD as Referring Physician (Dermatology)   Past Medical History:  Diagnosis Date  . Allergy   . Fibroid   . Hyperlipidemia   . Hypertension   . Migraines   . Obesity, morbid, BMI 40.0-49.9 ([place])   . Sleep apnea    cpap    Past Surgical History:  Procedure Laterality Date  . bone spur     removed  . CESAREAN SECTION  1995  . VAGINAL HYSTERECTOMY  1998   partial    Family History  Problem Relation Age of Onset  . Coronary artery disease Mother   . Hypertension Mother   . Kidney disease Mother 14       dialysis  . Aneurysm Mother        brain  . Stroke Mother   . Heart disease Mother        MI  . Cancer Father 43       brain cancer  . Diabetes Sister   . Hyperlipidemia Sister   . Hypertension Sister   . Arthritis Brother   . Hypertension Brother     . Diabetes Brother   . Hypertension Brother   . Hypertension Sister     Social History   Socioeconomic History  . Marital status: Divorced    Spouse name: Not on file  . Number of children: 1  . Years of education: 37  . Highest education level: Not on file  Social Needs  . Financial resource strain: Not on file  . Food insecurity - worry: Not on file  . Food insecurity - inability: Not on file  . Transportation needs - medical: Not on file  . Transportation needs - non-medical: Not on file  Occupational History  . Occupation: customer service   Tobacco Use  . Smoking status: Current Every Day Smoker    Packs/day: 0.50    Years: 30.00    Pack years: 15.00    Types: Cigarettes  . Smokeless tobacco: Never Used  . Tobacco comment: cutting back  Substance and Sexual Activity  . Alcohol use: Yes    Alcohol/week: 0.0 oz    Comment: rarely; once yearly  . Drug use: No  . Sexual activity: Yes    Partners: Male    Birth control/protection: Surgical, Condom  Other Topics Concern  . Not on  file  Social History Narrative   Exercise-- no    Outpatient Medications Prior to Visit  Medication Sig Dispense Refill  . acetaminophen (TYLENOL) 325 MG tablet Take 650 mg by mouth every 6 (six) hours as needed.    Marland Kitchen albuterol (PROVENTIL HFA;VENTOLIN HFA) 108 (90 Base) MCG/ACT inhaler Inhale 2 puffs into the lungs every 6 (six) hours as needed for wheezing or shortness of breath. 1 Inhaler 0  . Ascorbic Acid (VITAMIN C) 100 MG tablet Take 100 mg by mouth daily.    . benazepril (LOTENSIN) 10 MG tablet TAKE 1 TABLET BY MOUTH  DAILY 90 tablet 0  . cholecalciferol (VITAMIN D) 1000 units tablet Take 6,000 Units by mouth daily.     . cyclobenzaprine (FLEXERIL) 10 MG tablet Take 1 tablet (10 mg total) by mouth 2 (two) times daily as needed for muscle spasms. 30 tablet 0  . fluticasone (FLONASE) 50 MCG/ACT nasal spray Place 2 sprays into both nostrils daily. 16 g 1  . hydrochlorothiazide  (HYDRODIURIL) 25 MG tablet Take 1 tablet (25 mg total) by mouth daily. 90 tablet 1  . meloxicam (MOBIC) 15 MG tablet Take 1 tablet (15 mg total) by mouth daily. 30 tablet 0  . Multiple Vitamin (MULTIVITAMIN WITH MINERALS) TABS Take 1 tablet by mouth daily.    Marland Kitchen omeprazole (PRILOSEC) 20 MG capsule Take 1 capsule (20 mg total) by mouth daily. 90 capsule 3  . OVER THE COUNTER MEDICATION Beano    . Potassium 99 MG TABS Take by mouth daily as needed.    . thiamine (VITAMIN B-1) 100 MG tablet Take 100 mg by mouth daily.    . traMADol (ULTRAM) 50 MG tablet Take 50 mg by mouth every 6 (six) hours as needed.    . valACYclovir (VALTREX) 500 MG tablet Take twice daily for 3-5 days as needed 90 tablet 4  . Vitamin D, Ergocalciferol, (DRISDOL) 50000 units CAPS capsule Take 1 capsule (50,000 Units total) by mouth every 7 (seven) days. 4 capsule 2  . amoxicillin-clavulanate (AUGMENTIN) 875-125 MG tablet Take 1 tablet by mouth 2 (two) times daily. 20 tablet 0  . benzonatate (TESSALON) 100 MG capsule Take 1 capsule (100 mg total) by mouth 3 (three) times daily as needed for cough. 21 capsule 0  . fluconazole (DIFLUCAN) 150 MG tablet 1 tab po as needed. Repeat if needed 1 tablet 1  . loratadine (CLARITIN) 10 MG tablet Take 10 mg by mouth daily.     Facility-Administered Medications Prior to Visit  Medication Dose Route Frequency Provider Last Rate Last Dose  . 0.9 %  sodium chloride infusion  500 mL Intravenous Continuous Milus Banister, MD        Allergies  Allergen Reactions  . Lactose Intolerance (Gi)   . Lipitor [Atorvastatin Calcium]     Nerves, muscle spasms, light stool    Review of Systems  Constitutional: Negative for fever and malaise/fatigue.  HENT: Positive for congestion, ear pain, hoarse voice, sinus pressure and sore throat.   Eyes: Negative for blurred vision.  Respiratory: Positive for shortness of breath. Negative for cough.   Cardiovascular: Negative for chest pain, palpitations  and leg swelling.  Gastrointestinal: Negative for vomiting.  Musculoskeletal: Negative for back pain.  Skin: Negative for rash.  Neurological: Positive for headaches. Negative for loss of consciousness.       Objective:    Physical Exam  Constitutional: She is oriented to person, place, and time. She appears well-developed and well-nourished.  HENT:  Right Ear: External ear normal.  Left Ear: External ear normal.  Nose: Mucosal edema, rhinorrhea and sinus tenderness present. No nasal deformity. Right sinus exhibits maxillary sinus tenderness and frontal sinus tenderness. Left sinus exhibits maxillary sinus tenderness and frontal sinus tenderness.  Mouth/Throat: Oropharynx is clear and moist and mucous membranes are normal. No oropharyngeal exudate.  + PND + errythema  Eyes: Conjunctivae are normal. Right eye exhibits no discharge. Left eye exhibits no discharge.  Neck: Normal range of motion. Neck supple.  Cardiovascular: Normal rate, regular rhythm and normal heart sounds.  No murmur heard. Pulmonary/Chest: Effort normal and breath sounds normal. No respiratory distress. She has no wheezes. She has no rales. She exhibits no tenderness.  Musculoskeletal: She exhibits no edema, tenderness or deformity.  No calf pain No errythema Not hot to touch   Lymphadenopathy:    She has cervical adenopathy.       Right cervical: Superficial cervical adenopathy present.       Left cervical: Superficial cervical adenopathy present.  Neurological: She is alert and oriented to person, place, and time.  Skin: Skin is warm and dry. No rash noted. She is not diaphoretic. No erythema. No pallor.  Psychiatric: She has a normal mood and affect.  Nursing note and vitals reviewed.   BP 138/82 (BP Location: Right Arm, Cuff Size: Large)   Pulse 80   Temp 99 F (37.2 C) (Oral)   Resp 16   Ht 5\' 5"  (1.651 m)   Wt (!) 300 lb 3.2 oz (136.2 kg)   SpO2 98%   BMI 49.96 kg/m  Wt Readings from Last 3  Encounters:  06/09/17 (!) 300 lb 3.2 oz (136.2 kg)  02/12/17 (!) 306 lb (138.8 kg)  11/27/16 (!) 307 lb 6.4 oz (139.4 kg)   BP Readings from Last 3 Encounters:  06/09/17 138/82  02/12/17 116/70  11/27/16 (!) 162/85     Immunization History  Administered Date(s) Administered  . Td 02/07/2008    Health Maintenance  Topic Date Due  . MAMMOGRAM  04/18/2017  . INFLUENZA VACCINE  02/12/2018 (Originally 11/19/2016)  . TETANUS/TDAP  02/06/2018  . COLONOSCOPY  05/19/2026  . HIV Screening  Completed    Lab Results  Component Value Date   WBC 6.9 02/12/2017   HGB 12.3 02/12/2017   HCT 37.7 02/12/2017   PLT 306.0 02/12/2017   GLUCOSE 112 (H) 02/12/2017   CHOL 196 02/12/2017   TRIG 124.0 02/12/2017   HDL 42.10 02/12/2017   LDLDIRECT 151.6 01/18/2013   LDLCALC 129 (H) 02/12/2017   ALT 20 02/12/2017   AST 17 02/12/2017   NA 140 02/12/2017   K 3.8 02/12/2017   CL 106 02/12/2017   CREATININE 0.56 02/12/2017   BUN 14 02/12/2017   CO2 28 02/12/2017   TSH 1.69 02/12/2017   HGBA1C 5.2 03/31/2012    Lab Results  Component Value Date   TSH 1.69 02/12/2017   Lab Results  Component Value Date   WBC 6.9 02/12/2017   HGB 12.3 02/12/2017   HCT 37.7 02/12/2017   MCV 96.0 02/12/2017   PLT 306.0 02/12/2017   Lab Results  Component Value Date   NA 140 02/12/2017   K 3.8 02/12/2017   CO2 28 02/12/2017   GLUCOSE 112 (H) 02/12/2017   BUN 14 02/12/2017   CREATININE 0.56 02/12/2017   BILITOT 0.5 02/12/2017   ALKPHOS 57 02/12/2017   AST 17 02/12/2017   ALT 20 02/12/2017   PROT 7.2 02/12/2017  ALBUMIN 3.9 02/12/2017   CALCIUM 9.8 02/12/2017   GFR 146.68 02/12/2017   Lab Results  Component Value Date   CHOL 196 02/12/2017   Lab Results  Component Value Date   HDL 42.10 02/12/2017   Lab Results  Component Value Date   LDLCALC 129 (H) 02/12/2017   Lab Results  Component Value Date   TRIG 124.0 02/12/2017   Lab Results  Component Value Date   CHOLHDL 5 02/12/2017    Lab Results  Component Value Date   HGBA1C 5.2 03/31/2012         Assessment & Plan:   Problem List Items Addressed This Visit      Unprioritized   Leg skin lesion, right    Resolved Doubt dvt or hsv Pt advised to come in if they occur again  Explained to pt that if she was dx with HSV 1 --- it is the oral herpes type that she has in the genital area  Not genital herpes       Sinusitis - Primary   Relevant Medications   doxycycline (VIBRA-TABS) 100 MG tablet   fluticasone (FLONASE) 50 MCG/ACT nasal spray   promethazine-dextromethorphan (PROMETHAZINE-DM) 6.25-15 MG/5ML syrup   fluconazole (DIFLUCAN) 150 MG tablet    Other Visit Diagnoses    Cough       Relevant Medications   promethazine-dextromethorphan (PROMETHAZINE-DM) 6.25-15 MG/5ML syrup      I have discontinued Maahi K. Lichtenberg's loratadine, amoxicillin-clavulanate, benzonatate, and fluconazole. I am also having her start on doxycycline, fluticasone, promethazine-dextromethorphan, and fluconazole. Additionally, I am having her maintain her multivitamin with minerals, cyclobenzaprine, vitamin C, thiamine, hydrochlorothiazide, OVER THE COUNTER MEDICATION, Potassium, meloxicam, cholecalciferol, valACYclovir, omeprazole, fluticasone, albuterol, traMADol, acetaminophen, Vitamin D (Ergocalciferol), and benazepril. We will continue to administer sodium chloride.  Meds ordered this encounter  Medications  . doxycycline (VIBRA-TABS) 100 MG tablet    Sig: Take 1 tablet (100 mg total) by mouth 2 (two) times daily.    Dispense:  20 tablet    Refill:  0  . fluticasone (FLONASE) 50 MCG/ACT nasal spray    Sig: Place 2 sprays into both nostrils daily.    Dispense:  16 g    Refill:  6  . promethazine-dextromethorphan (PROMETHAZINE-DM) 6.25-15 MG/5ML syrup    Sig: Take 5 mLs by mouth 4 (four) times daily as needed.    Dispense:  118 mL    Refill:  0  . fluconazole (DIFLUCAN) 150 MG tablet    Sig: 1 po qd x 1, may repeat  in 3 days prn    Dispense:  2 tablet    Refill:  0    CMA served as scribe during this visit. History, Physical and Plan performed by medical provider. Documentation and orders reviewed and attested to.  Ann Held, DO

## 2017-06-09 NOTE — Assessment & Plan Note (Signed)
Resolved Doubt dvt or hsv Pt advised to come in if they occur again  Explained to pt that if she was dx with HSV 1 --- it is the oral herpes type that she has in the genital area  Not genital herpes

## 2017-06-09 NOTE — Telephone Encounter (Signed)
Patient called back and said everything has been taken care of.

## 2017-06-09 NOTE — Telephone Encounter (Signed)
Need more info.  Is this a screening mammogram or does she have anything wrong?  Patient was just in office today.

## 2017-06-10 DIAGNOSIS — G4733 Obstructive sleep apnea (adult) (pediatric): Secondary | ICD-10-CM | POA: Diagnosis not present

## 2017-06-11 ENCOUNTER — Ambulatory Visit (INDEPENDENT_AMBULATORY_CARE_PROVIDER_SITE_OTHER): Payer: BLUE CROSS/BLUE SHIELD | Admitting: Adult Health

## 2017-06-11 ENCOUNTER — Ambulatory Visit: Payer: BLUE CROSS/BLUE SHIELD | Admitting: Adult Health

## 2017-06-11 ENCOUNTER — Encounter: Payer: Self-pay | Admitting: Adult Health

## 2017-06-11 DIAGNOSIS — G4733 Obstructive sleep apnea (adult) (pediatric): Secondary | ICD-10-CM

## 2017-06-11 NOTE — Assessment & Plan Note (Signed)
Wt loss  

## 2017-06-11 NOTE — Assessment & Plan Note (Signed)
Controlled on CPAP , has some insomnia intermittently .  Discussed healthy sleep regimen .   Plan  Patient Instructions  Keep up good work on CPAP At bedtime   Try to wear each night for at least 6 hrs.  May try melatonin to help with insomnia as long as you are wearing your CPAP .  Healthy sleep regimen as discussed.  Stay active and work on healthy weight .  Do not drive if sleepy.  Follow up with Dr. Elsworth Soho  In  1 year and As needed

## 2017-06-11 NOTE — Progress Notes (Signed)
@Patient  ID: Tiffany Gallagher, female    DOB: 1966/03/15, 52 y.o.   MRN: [id number]  Chief Complaint  Patient presents with  . Follow-up    OSA     Referring provider: Ann Held, *  HPI: 52 yo female followed for OSA   TEST  Sleep study 03/2015 >severe AHI 151/hr , titration with 17cm optimal control   06/11/2017 Follow up : OSA  Patient presents for a one year follow-up for sleep apnea.  Patient has very severe sleep apnea is on CPAP at bedtime.  Patient says she has been doing okay on her CPAP.   She denies significant daytime sleepiness but does have occasional insomnia. Hard to time falling aslpeep . Marland Kitchen  Download shows good compliance at 77%.  She uses it on average 6 hours each time.  She is on CPAP 17 cm H2O.  AHI 0.5.  Positive leaks. We discussed the severity of her OSA and needs to wear CPAP all night .  Discussed healthy sleep regimen . Advised to limit TV/electronics At bedtime  .   Wants work note for her boss that she has OSA .    Allergies  Allergen Reactions  . Lactose Intolerance (Gi)   . Lipitor [Atorvastatin Calcium]     Nerves, muscle spasms, light stool    Immunization History  Administered Date(s) Administered  . Td 02/07/2008    Past Medical History:  Diagnosis Date  . Allergy   . Fibroid   . Hyperlipidemia   . Hypertension   . Migraines   . Obesity, morbid, BMI 40.0-49.9 ([place])   . Sleep apnea    cpap    Tobacco History: Social History   Tobacco Use  Smoking Status Current Every Day Smoker  . Packs/day: 0.50  . Years: 30.00  . Pack years: 15.00  . Types: Cigarettes  Smokeless Tobacco Never Used  Tobacco Comment   cutting back   Ready to quit: No Counseling given: Yes Comment: cutting back   Outpatient Encounter Medications as of 06/11/2017  Medication Sig  . acetaminophen (TYLENOL) 325 MG tablet Take 650 mg by mouth every 6 (six) hours as needed.  Marland Kitchen albuterol (PROVENTIL HFA;VENTOLIN HFA) 108 (90 Base) MCG/ACT  inhaler Inhale 2 puffs into the lungs every 6 (six) hours as needed for wheezing or shortness of breath.  . Ascorbic Acid (VITAMIN C) 100 MG tablet Take 100 mg by mouth daily.  . benazepril (LOTENSIN) 10 MG tablet TAKE 1 TABLET BY MOUTH  DAILY  . cholecalciferol (VITAMIN D) 1000 units tablet Take 6,000 Units by mouth daily.   . cyclobenzaprine (FLEXERIL) 10 MG tablet Take 1 tablet (10 mg total) by mouth 2 (two) times daily as needed for muscle spasms.  Marland Kitchen doxycycline (VIBRA-TABS) 100 MG tablet Take 1 tablet (100 mg total) by mouth 2 (two) times daily.  . fluconazole (DIFLUCAN) 150 MG tablet 1 po qd x 1, may repeat in 3 days prn  . fluticasone (FLONASE) 50 MCG/ACT nasal spray Place 2 sprays into both nostrils daily.  . fluticasone (FLONASE) 50 MCG/ACT nasal spray Place 2 sprays into both nostrils daily.  . hydrochlorothiazide (HYDRODIURIL) 25 MG tablet Take 1 tablet (25 mg total) by mouth daily.  . meloxicam (MOBIC) 15 MG tablet Take 1 tablet (15 mg total) by mouth daily.  . Multiple Vitamin (MULTIVITAMIN WITH MINERALS) TABS Take 1 tablet by mouth daily.  Marland Kitchen omeprazole (PRILOSEC) 20 MG capsule Take 1 capsule (20 mg total) by mouth daily.  Marland Kitchen  OVER THE COUNTER MEDICATION Beano  . Potassium 99 MG TABS Take by mouth daily as needed.  . promethazine-dextromethorphan (PROMETHAZINE-DM) 6.25-15 MG/5ML syrup Take 5 mLs by mouth 4 (four) times daily as needed.  . thiamine (VITAMIN B-1) 100 MG tablet Take 100 mg by mouth daily.  . traMADol (ULTRAM) 50 MG tablet Take 50 mg by mouth every 6 (six) hours as needed.  . valACYclovir (VALTREX) 500 MG tablet Take twice daily for 3-5 days as needed  . Vitamin D, Ergocalciferol, (DRISDOL) 50000 units CAPS capsule Take 1 capsule (50,000 Units total) by mouth every 7 (seven) days.   Facility-Administered Encounter Medications as of 06/11/2017  Medication  . 0.9 %  sodium chloride infusion     Review of Systems  Constitutional:   No  weight loss, night sweats,   Fevers, chills, fatigue, or  lassitude.  HEENT:   No headaches,  Difficulty swallowing,  Tooth/dental problems, or  Sore throat,                No sneezing, itching, ear ache, nasal congestion, post nasal drip,   CV:  No chest pain,  Orthopnea, PND, swelling in lower extremities, anasarca, dizziness, palpitations, syncope.   GI  No heartburn, indigestion, abdominal pain, nausea, vomiting, diarrhea, change in bowel habits, loss of appetite, bloody stools.   Resp: No shortness of breath with exertion or at rest.  No excess mucus, no productive cough,  No non-productive cough,  No coughing up of blood.  No change in color of mucus.  No wheezing.  No chest wall deformity  Skin: no rash or lesions.  GU: no dysuria, change in color of urine, no urgency or frequency.  No flank pain, no hematuria   MS:  No joint pain or swelling.  No decreased range of motion.  No back pain.    Physical Exam  BP 136/84 (BP Location: Left Arm, Cuff Size: Normal)   Pulse 85   Ht 5\' 6"  (1.676 m)   Wt (!) 303 lb (137.4 kg)   SpO2 96%   BMI 48.91 kg/m   GEN: A/Ox3; pleasant , NAD, morbidly obesity .    HEENT:  [place]/AT,  EACs-clear, TMs-wnl, NOSE-clear, THROAT-clear, no lesions, no postnasal drip or exudate noted.   NECK:  Supple w/ fair ROM; no JVD; normal carotid impulses w/o bruits; no thyromegaly or nodules palpated; no lymphadenopathy.    RESP  Clear  P & A; w/o, wheezes/ rales/ or rhonchi. no accessory muscle use, no dullness to percussion  CARD:  RRR, no m/r/g, no peripheral edema, pulses intact, no cyanosis or clubbing.  GI:   Soft & nt; nml bowel sounds; no organomegaly or masses detected.   Musco: Warm bil, no deformities or joint swelling noted.   Neuro: alert, no focal deficits noted.    Skin: Warm, no lesions or rashes    Lab Results:  CBC    Component Value Date/Time   WBC 6.9 02/12/2017 0946   RBC 3.93 02/12/2017 0946   HGB 12.3 02/12/2017 0946   HCT 37.7 02/12/2017 0946   PLT  306.0 02/12/2017 0946   MCV 96.0 02/12/2017 0946   MCV 100.1 (A) 03/31/2012 1854   MCH 32.2 08/22/2012 1356   MCHC 32.6 02/12/2017 0946   RDW 14.2 02/12/2017 0946   LYMPHSABS 3.0 02/12/2017 0946   MONOABS 0.5 02/12/2017 0946   EOSABS 0.2 02/12/2017 0946   BASOSABS 0.0 02/12/2017 0946    BMET    Component Value Date/Time  NA 140 02/12/2017 0946   K 3.8 02/12/2017 0946   CL 106 02/12/2017 0946   CO2 28 02/12/2017 0946   GLUCOSE 112 (H) 02/12/2017 0946   BUN 14 02/12/2017 0946   CREATININE 0.56 02/12/2017 0946   CREATININE 0.76 03/31/2012 1853   CALCIUM 9.8 02/12/2017 0946   GFRNONAA >90 08/22/2012 1356   GFRAA >90 08/22/2012 1356    BNP No results found for: BNP  ProBNP No results found for: PROBNP  Imaging: No results found.   Assessment & Plan:   OSA (obstructive sleep apnea) Controlled on CPAP , has some insomnia intermittently .  Discussed healthy sleep regimen .   Plan  Patient Instructions  Keep up good work on CPAP At bedtime   Try to wear each night for at least 6 hrs.  May try melatonin to help with insomnia as long as you are wearing your CPAP .  Healthy sleep regimen as discussed.  Stay active and work on healthy weight .  Do not drive if sleepy.  Follow up with Dr. Elsworth Soho  In  1 year and As needed        Severe obesity (BMI >= 40) Wt loss      Rexene Edison, NP 06/11/2017

## 2017-06-11 NOTE — Patient Instructions (Addendum)
Keep up good work on CPAP At bedtime   Try to wear each night for at least 6 hrs.  May try melatonin to help with insomnia as long as you are wearing your CPAP .  Healthy sleep regimen as discussed.  Stay active and work on healthy weight .  Do not drive if sleepy.  Follow up with Dr. Elsworth Soho  In  1 year and As needed

## 2017-06-22 ENCOUNTER — Encounter: Payer: Self-pay | Admitting: Women's Health

## 2017-06-22 ENCOUNTER — Ambulatory Visit (INDEPENDENT_AMBULATORY_CARE_PROVIDER_SITE_OTHER): Payer: BLUE CROSS/BLUE SHIELD | Admitting: Women's Health

## 2017-06-22 VITALS — BP 120/84 | Ht 65.0 in | Wt 297.0 lb

## 2017-06-22 DIAGNOSIS — N760 Acute vaginitis: Secondary | ICD-10-CM

## 2017-06-22 DIAGNOSIS — Z01419 Encounter for gynecological examination (general) (routine) without abnormal findings: Secondary | ICD-10-CM | POA: Diagnosis not present

## 2017-06-22 DIAGNOSIS — B9689 Other specified bacterial agents as the cause of diseases classified elsewhere: Secondary | ICD-10-CM

## 2017-06-22 LAB — WET PREP FOR TRICH, YEAST, CLUE

## 2017-06-22 MED ORDER — METRONIDAZOLE 500 MG PO TABS
500.0000 mg | ORAL_TABLET | Freq: Two times a day (BID) | ORAL | 0 refills | Status: DC
Start: 2017-06-22 — End: 2017-11-16

## 2017-06-22 MED ORDER — FLUCONAZOLE 150 MG PO TABS
150.0000 mg | ORAL_TABLET | Freq: Once | ORAL | 1 refills | Status: AC
Start: 2017-06-22 — End: 2017-06-22

## 2017-06-22 NOTE — Patient Instructions (Signed)
Health Maintenance for Postmenopausal Women Menopause is a normal process in which your reproductive ability comes to an end. This process happens gradually over a span of months to years, usually between the ages of 22 and 9. Menopause is complete when you have missed 12 consecutive menstrual periods. It is important to talk with your health care provider about some of the most common conditions that affect postmenopausal women, such as heart disease, cancer, and bone loss (osteoporosis). Adopting a healthy lifestyle and getting preventive care can help to promote your health and wellness. Those actions can also lower your chances of developing some of these common conditions. What should I know about menopause? During menopause, you may experience a number of symptoms, such as:  Moderate-to-severe hot flashes.  Night sweats.  Decrease in sex drive.  Mood swings.  Headaches.  Tiredness.  Irritability.  Memory problems.  Insomnia.  Choosing to treat or not to treat menopausal changes is an individual decision that you make with your health care provider. What should I know about hormone replacement therapy and supplements? Hormone therapy products are effective for treating symptoms that are associated with menopause, such as hot flashes and night sweats. Hormone replacement carries certain risks, especially as you become older. If you are thinking about using estrogen or estrogen with progestin treatments, discuss the benefits and risks with your health care provider. What should I know about heart disease and stroke? Heart disease, heart attack, and stroke become more likely as you age. This may be due, in part, to the hormonal changes that your body experiences during menopause. These can affect how your body processes dietary fats, triglycerides, and cholesterol. Heart attack and stroke are both medical emergencies. There are many things that you can do to help prevent heart disease  and stroke:  Have your blood pressure checked at least every 1-2 years. High blood pressure causes heart disease and increases the risk of stroke.  If you are 53-22 years old, ask your health care provider if you should take aspirin to prevent a heart attack or a stroke.  Do not use any tobacco products, including cigarettes, chewing tobacco, or electronic cigarettes. If you need help quitting, ask your health care provider.  It is important to eat a healthy diet and maintain a healthy weight. ? Be sure to include plenty of vegetables, fruits, low-fat dairy products, and lean protein. ? Avoid eating foods that are high in solid fats, added sugars, or salt (sodium).  Get regular exercise. This is one of the most important things that you can do for your health. ? Try to exercise for at least 150 minutes each week. The type of exercise that you do should increase your heart rate and make you sweat. This is known as moderate-intensity exercise. ? Try to do strengthening exercises at least twice each week. Do these in addition to the moderate-intensity exercise.  Know your numbers.Ask your health care provider to check your cholesterol and your blood glucose. Continue to have your blood tested as directed by your health care provider.  What should I know about cancer screening? There are several types of cancer. Take the following steps to reduce your risk and to catch any cancer development as early as possible. Breast Cancer  Practice breast self-awareness. ? This means understanding how your breasts normally appear and feel. ? It also means doing regular breast self-exams. Let your health care provider know about any changes, no matter how small.  If you are 40  or older, have a clinician do a breast exam (clinical breast exam or CBE) every year. Depending on your age, family history, and medical history, it may be recommended that you also have a yearly breast X-ray (mammogram).  If you  have a family history of breast cancer, talk with your health care provider about genetic screening.  If you are at high risk for breast cancer, talk with your health care provider about having an MRI and a mammogram every year.  Breast cancer (BRCA) gene test is recommended for women who have family members with BRCA-related cancers. Results of the assessment will determine the need for genetic counseling and BRCA1 and for BRCA2 testing. BRCA-related cancers include these types: ? Breast. This occurs in males or females. ? Ovarian. ? Tubal. This may also be called fallopian tube cancer. ? Cancer of the abdominal or pelvic lining (peritoneal cancer). ? Prostate. ? Pancreatic.  Cervical, Uterine, and Ovarian Cancer Your health care provider may recommend that you be screened regularly for cancer of the pelvic organs. These include your ovaries, uterus, and vagina. This screening involves a pelvic exam, which includes checking for microscopic changes to the surface of your cervix (Pap test).  For women ages 21-65, health care providers may recommend a pelvic exam and a Pap test every three years. For women ages 79-65, they may recommend the Pap test and pelvic exam, combined with testing for human papilloma virus (HPV), every five years. Some types of HPV increase your risk of cervical cancer. Testing for HPV may also be done on women of any age who have unclear Pap test results.  Other health care providers may not recommend any screening for nonpregnant women who are considered low risk for pelvic cancer and have no symptoms. Ask your health care provider if a screening pelvic exam is right for you.  If you have had past treatment for cervical cancer or a condition that could lead to cancer, you need Pap tests and screening for cancer for at least 20 years after your treatment. If Pap tests have been discontinued for you, your risk factors (such as having a new sexual partner) need to be  reassessed to determine if you should start having screenings again. Some women have medical problems that increase the chance of getting cervical cancer. In these cases, your health care provider may recommend that you have screening and Pap tests more often.  If you have a family history of uterine cancer or ovarian cancer, talk with your health care provider about genetic screening.  If you have vaginal bleeding after reaching menopause, tell your health care provider.  There are currently no reliable tests available to screen for ovarian cancer.  Lung Cancer Lung cancer screening is recommended for adults 69-62 years old who are at high risk for lung cancer because of a history of smoking. A yearly low-dose CT scan of the lungs is recommended if you:  Currently smoke.  Have a history of at least 30 pack-years of smoking and you currently smoke or have quit within the past 15 years. A pack-year is smoking an average of one pack of cigarettes per day for one year.  Yearly screening should:  Continue until it has been 15 years since you quit.  Stop if you develop a health problem that would prevent you from having lung cancer treatment.  Colorectal Cancer  This type of cancer can be detected and can often be prevented.  Routine colorectal cancer screening usually begins at  age 42 and continues through age 45.  If you have risk factors for colon cancer, your health care provider may recommend that you be screened at an earlier age.  If you have a family history of colorectal cancer, talk with your health care provider about genetic screening.  Your health care provider may also recommend using home test kits to check for hidden blood in your stool.  A small camera at the end of a tube can be used to examine your colon directly (sigmoidoscopy or colonoscopy). This is done to check for the earliest forms of colorectal cancer.  Direct examination of the colon should be repeated every  5-10 years until age 71. However, if early forms of precancerous polyps or small growths are found or if you have a family history or genetic risk for colorectal cancer, you may need to be screened more often.  Skin Cancer  Check your skin from head to toe regularly.  Monitor any moles. Be sure to tell your health care provider: ? About any new moles or changes in moles, especially if there is a change in a mole's shape or color. ? If you have a mole that is larger than the size of a pencil eraser.  If any of your family members has a history of skin cancer, especially at a Tasman Zapata age, talk with your health care provider about genetic screening.  Always use sunscreen. Apply sunscreen liberally and repeatedly throughout the day.  Whenever you are outside, protect yourself by wearing long sleeves, pants, a wide-brimmed hat, and sunglasses.  What should I know about osteoporosis? Osteoporosis is a condition in which bone destruction happens more quickly than new bone creation. After menopause, you may be at an increased risk for osteoporosis. To help prevent osteoporosis or the bone fractures that can happen because of osteoporosis, the following is recommended:  If you are 46-71 years old, get at least 1,000 mg of calcium and at least 600 mg of vitamin D per day.  If you are older than age 55 but younger than age 65, get at least 1,200 mg of calcium and at least 600 mg of vitamin D per day.  If you are older than age 54, get at least 1,200 mg of calcium and at least 800 mg of vitamin D per day.  Smoking and excessive alcohol intake increase the risk of osteoporosis. Eat foods that are rich in calcium and vitamin D, and do weight-bearing exercises several times each week as directed by your health care provider. What should I know about how menopause affects my mental health? Depression may occur at any age, but it is more common as you become older. Common symptoms of depression  include:  Low or sad mood.  Changes in sleep patterns.  Changes in appetite or eating patterns.  Feeling an overall lack of motivation or enjoyment of activities that you previously enjoyed.  Frequent crying spells.  Talk with your health care provider if you think that you are experiencing depression. What should I know about immunizations? It is important that you get and maintain your immunizations. These include:  Tetanus, diphtheria, and pertussis (Tdap) booster vaccine.  Influenza every year before the flu season begins.  Pneumonia vaccine.  Shingles vaccine.  Your health care provider may also recommend other immunizations. This information is not intended to replace advice given to you by your health care provider. Make sure you discuss any questions you have with your health care provider. Document Released: 05/30/2005  Document Revised: 10/26/2015 Document Reviewed: 01/09/2015 Elsevier Interactive Patient Education  2018 [place] with Quitting Smoking Quitting smoking is a physical and mental challenge. You will face cravings, withdrawal symptoms, and temptation. Before quitting, work with your health care provider to make a plan that can help you cope. Preparation can help you quit and keep you from giving in. How can I cope with cravings? Cravings usually last for 5-10 minutes. If you get through it, the craving will pass. Consider taking the following actions to help you cope with cravings:  Keep your mouth busy: ? Chew sugar-free gum. ? Suck on hard candies or a straw. ? Brush your teeth.  Keep your hands and body busy: ? Immediately change to a different activity when you feel a craving. ? Squeeze or play with a ball. ? Do an activity or a hobby, like making bead jewelry, practicing needlepoint, or working with wood. ? Mix up your normal routine. ? Take a short exercise break. Go for a quick walk or run up and down stairs. ? Spend time in public  places where smoking is not allowed.  Focus on doing something kind or helpful for someone else.  Call a friend or family member to talk during a craving.  Join a support group.  Call a quit line, such as 1-800-QUIT-NOW.  Talk with your health care provider about medicines that might help you cope with cravings and make quitting easier for you.  How can I deal with withdrawal symptoms? Your body may experience negative effects as it tries to get used to not having nicotine in the system. These effects are called withdrawal symptoms. They may include:  Feeling hungrier than normal.  Trouble concentrating.  Irritability.  Trouble sleeping.  Feeling depressed.  Restlessness and agitation.  Craving a cigarette.  To manage withdrawal symptoms:  Avoid places, people, and activities that trigger your cravings.  Remember why you want to quit.  Get plenty of sleep.  Avoid coffee and other caffeinated drinks. These may worsen some of your symptoms.  How can I handle social situations? Social situations can be difficult when you are quitting smoking, especially in the first few weeks. To manage this, you can:  Avoid parties, bars, and other social situations where people might be smoking.  Avoid alcohol.  Leave right away if you have the urge to smoke.  Explain to your family and friends that you are quitting smoking. Ask for understanding and support.  Plan activities with friends or family where smoking is not an option.  What are some ways I can cope with stress? Wanting to smoke may cause stress, and stress can make you want to smoke. Find ways to manage your stress. Relaxation techniques can help. For example:  Breathe slowly and deeply, in through your nose and out through your mouth.  Listen to soothing, relaxing music.  Talk with a family member or friend about your stress.  Light a candle.  Soak in a bath or take a shower.  Think about a peaceful  place.  What are some ways I can prevent weight gain? Be aware that many people gain weight after they quit smoking. However, not everyone does. To keep from gaining weight, have a plan in place before you quit and stick to the plan after you quit. Your plan should include:  Having healthy snacks. When you have a craving, it may help to: ? Eat plain popcorn, crunchy carrots, celery, or other cut vegetables. ? Chew  sugar-free gum.  Changing how you eat: ? Eat small portion sizes at meals. ? Eat 4-6 small meals throughout the day instead of 1-2 large meals a day. ? Be mindful when you eat. Do not watch television or do other things that might distract you as you eat.  Exercising regularly: ? Make time to exercise each day. If you do not have time for a long workout, do short bouts of exercise for 5-10 minutes several times a day. ? Do some form of strengthening exercise, like weight lifting, and some form of aerobic exercise, like running or swimming.  Drinking plenty of water or other low-calorie or no-calorie drinks. Drink 6-8 glasses of water daily, or as much as instructed by your health care provider.  Summary  Quitting smoking is a physical and mental challenge. You will face cravings, withdrawal symptoms, and temptation to smoke again. Preparation can help you as you go through these challenges.  You can cope with cravings by keeping your mouth busy (such as by chewing gum), keeping your body and hands busy, and making calls to family, friends, or a helpline for people who want to quit smoking.  You can cope with withdrawal symptoms by avoiding places where people smoke, avoiding drinks with caffeine, and getting plenty of rest.  Ask your health care provider about the different ways to prevent weight gain, avoid stress, and handle social situations. This information is not intended to replace advice given to you by your health care provider. Make sure you discuss any questions you  have with your health care provider. Document Released: 04/04/2016 Document Revised: 04/04/2016 Document Reviewed: 04/04/2016 Elsevier Interactive Patient Education  [employer].

## 2017-06-22 NOTE — Progress Notes (Signed)
Tiffany Gallagher Jul 21, 1965 [id number]    History:    Presents for annual exam.  1990 TVH  for fibroids on no HRT. Smokes half pack cigarettes daily. HSV 1 rare outbreaks. Normal Pap and mammogram history. 04/2016 2 benign  small colon polyps 10 year follow-up. Primary care manages hypertension and GERD. Same partner. 2018 fractured toe while dancing. Currently on an antibiotic for upper respiratory infection.  Past medical history, past surgical history, family history and social history were all reviewed and documented in the EPIC chart. 1 son doing well. Father deceased brain cancer. Mother deceased, hypertension, stroke, coronary artery and kidney disease. All siblings (4)  hypertension.  ROS:  A ROS was performed and pertinent positives and negatives are included.  Exam:  Vitals:   06/22/17 1614  BP: 120/84  Weight: 297 lb (134.7 kg)  Height: 5\' 5"  (1.651 m)   Body mass index is 49.42 kg/m.   General appearance:  Normal Thyroid:  Symmetrical, normal in size, without palpable masses or nodularity. Respiratory  Auscultation:  Clear without wheezing or rhonchi Cardiovascular  Auscultation:  Regular rate, without rubs, murmurs or gallops  Edema/varicosities:  Not grossly evident Abdominal  Soft,nontender, without masses, guarding or rebound.  Liver/spleen:  No organomegaly noted  Hernia:  None appreciated  Skin  Inspection:  Grossly normal   Breasts: Examined lying and sitting.     Right: Without masses, retractions, discharge or axillary adenopathy.     Left: Without masses, retractions, discharge or axillary adenopathy. Gentitourinary   Inguinal/mons:  Normal without inguinal adenopathy  External genitalia:  Normal  BUS/Urethra/Skene's glands:  Normal  Vagina:  White adherent discharge, wet prep positive for amines, TNTC bacteria  Cervix:  And uterus absent Adnexa/parametria:     Rt: Without masses or tenderness.   Lt: Without masses or tenderness.  Anus and  perineum: Normal  Digital rectal exam: Normal sphincter tone without palpated masses or tenderness  Assessment/Plan:  52 y.o. SBF G2 P1 for annual exam.  TVH on no HRT Bacteria vaginosis Hypertension/GERD-primary care manages labs and meds Upper respiratory infection on antibiotic-primary care managing Obesity Smoker half pack daily  Plan: Flagyl 500 twice daily for 7 days, alcohol precautions reviewed. Diflucan 150 by mouth 1 dose. Instructed to call if no relief of discharge. Reviewed importance of increasing rest, fluids, staying home from work. SBE's, annual screening mammogram, keep scheduled appointment, calcium rich diet, vitamin D 2000 daily encouraged. Reviewed importance of weightbearing imbalance type exercise. Home safety, fall prevention discussed. Reviewed importance of no smoking/quitting, low carb/calorie diet encouraged.    [facility]uel [facility] [place], 5:12 PM 06/22/2017

## 2017-06-23 ENCOUNTER — Telehealth: Payer: Self-pay | Admitting: *Deleted

## 2017-06-23 NOTE — Telephone Encounter (Signed)
Pt called asking if STD screening was done on visit on 06/22/17. I told pt screening was not done.

## 2017-06-26 ENCOUNTER — Other Ambulatory Visit: Payer: BLUE CROSS/BLUE SHIELD

## 2017-07-13 ENCOUNTER — Ambulatory Visit
Admission: RE | Admit: 2017-07-13 | Discharge: 2017-07-13 | Disposition: A | Discharge status: Home / Self Care | Payer: BLUE CROSS/BLUE SHIELD | Source: Ambulatory Visit | Attending: Family Medicine | Admitting: Family Medicine

## 2017-07-13 DIAGNOSIS — Z139 Encounter for screening, unspecified: Secondary | ICD-10-CM

## 2017-07-13 DIAGNOSIS — Z1231 Encounter for screening mammogram for malignant neoplasm of breast: Secondary | ICD-10-CM | POA: Diagnosis not present

## 2017-07-14 ENCOUNTER — Encounter: Payer: Self-pay | Admitting: Women's Health

## 2017-07-24 ENCOUNTER — Other Ambulatory Visit (INDEPENDENT_AMBULATORY_CARE_PROVIDER_SITE_OTHER): Payer: BLUE CROSS/BLUE SHIELD

## 2017-07-24 DIAGNOSIS — E559 Vitamin D deficiency, unspecified: Secondary | ICD-10-CM

## 2017-07-24 LAB — VITAMIN D 25 HYDROXY (VIT D DEFICIENCY, FRACTURES): VITD: 22.88 ng/mL — AB (ref 30.00–100.00)

## 2017-07-29 ENCOUNTER — Other Ambulatory Visit: Payer: Self-pay | Admitting: *Deleted

## 2017-07-29 DIAGNOSIS — E559 Vitamin D deficiency, unspecified: Secondary | ICD-10-CM

## 2017-07-29 MED ORDER — VITAMIN D (ERGOCALCIFEROL) 1.25 MG (50000 UNIT) PO CAPS: 50000.0000 [IU] | ORAL_CAPSULE | ORAL | 0 refills | Status: DC

## 2017-10-21 DIAGNOSIS — M79641 Pain in right hand: Secondary | ICD-10-CM | POA: Diagnosis not present

## 2017-10-21 DIAGNOSIS — E559 Vitamin D deficiency, unspecified: Secondary | ICD-10-CM | POA: Diagnosis not present

## 2017-10-21 DIAGNOSIS — M79642 Pain in left hand: Secondary | ICD-10-CM | POA: Diagnosis not present

## 2017-10-21 DIAGNOSIS — R5383 Other fatigue: Secondary | ICD-10-CM | POA: Diagnosis not present

## 2017-10-21 DIAGNOSIS — M2550 Pain in unspecified joint: Secondary | ICD-10-CM | POA: Diagnosis not present

## 2017-10-21 DIAGNOSIS — M25571 Pain in right ankle and joints of right foot: Secondary | ICD-10-CM | POA: Diagnosis not present

## 2017-10-21 DIAGNOSIS — M25572 Pain in left ankle and joints of left foot: Secondary | ICD-10-CM | POA: Diagnosis not present

## 2017-10-21 LAB — TSH: TSH: 1.22 (ref 0.41–5.90)

## 2017-10-21 LAB — HEPATIC FUNCTION PANEL
ALK PHOS: 63 (ref 25–125)
ALT: 10 (ref 7–35)
AST: 12 — AB (ref 13–35)
Bilirubin, Total: 0.4

## 2017-10-21 LAB — BASIC METABOLIC PANEL
BUN: 10 (ref 4–21)
Creatinine: 0.5 (ref 0.5–1.1)
Glucose: 97
POTASSIUM: 3.9 (ref 3.4–5.3)
Sodium: 139 (ref 137–147)

## 2017-10-21 LAB — VITAMIN B12: Vitamin B-12: 384

## 2017-10-21 LAB — CBC AND DIFFERENTIAL
Hemoglobin: 12.6 (ref 12.0–16.0)
PLATELETS: 301 (ref 150–399)
WBC: 7.3

## 2017-10-21 LAB — POCT ERYTHROCYTE SEDIMENTATION RATE, NON-AUTOMATED: SED RATE: 22

## 2017-10-21 LAB — VITAMIN D 25 HYDROXY (VIT D DEFICIENCY, FRACTURES): VIT D 25 HYDROXY: 27

## 2017-10-30 ENCOUNTER — Telehealth: Payer: Self-pay | Admitting: *Deleted

## 2017-10-30 NOTE — Telephone Encounter (Signed)
Received Lab Report results from [place]; forwarded to provider/SLS 07/12

## 2017-11-04 ENCOUNTER — Other Ambulatory Visit: Payer: BLUE CROSS/BLUE SHIELD

## 2017-11-09 ENCOUNTER — Encounter: Payer: Self-pay | Admitting: Family Medicine

## 2017-11-09 DIAGNOSIS — K2100 Gastro-esophageal reflux disease with esophagitis, without bleeding: Secondary | ICD-10-CM

## 2017-11-10 ENCOUNTER — Other Ambulatory Visit: Payer: Self-pay | Admitting: *Deleted

## 2017-11-10 ENCOUNTER — Encounter: Payer: Self-pay | Admitting: *Deleted

## 2017-11-10 MED ORDER — VITAMIN D (ERGOCALCIFEROL) 1.25 MG (50000 UNIT) PO CAPS: 50000.0000 [IU] | ORAL_CAPSULE | ORAL | 0 refills | Status: DC

## 2017-11-10 NOTE — Telephone Encounter (Signed)
Noted  

## 2017-11-10 NOTE — Telephone Encounter (Signed)
She has been on the vitamin d 50,000 units.  Can advise on her concern about why her level will not go up.

## 2017-11-15 ENCOUNTER — Encounter: Payer: Self-pay | Admitting: Family Medicine

## 2017-11-16 ENCOUNTER — Ambulatory Visit (INDEPENDENT_AMBULATORY_CARE_PROVIDER_SITE_OTHER): Payer: BLUE CROSS/BLUE SHIELD

## 2017-11-16 ENCOUNTER — Other Ambulatory Visit: Payer: Self-pay

## 2017-11-16 ENCOUNTER — Encounter (HOSPITAL_COMMUNITY): Payer: Self-pay | Admitting: Emergency Medicine

## 2017-11-16 ENCOUNTER — Ambulatory Visit (HOSPITAL_COMMUNITY)
Admission: EM | Admit: 2017-11-16 | Discharge: 2017-11-16 | Disposition: A | Discharge status: Home / Self Care | Payer: BLUE CROSS/BLUE SHIELD | Attending: Family Medicine | Admitting: Family Medicine

## 2017-11-16 DIAGNOSIS — K5732 Diverticulitis of large intestine without perforation or abscess without bleeding: Secondary | ICD-10-CM

## 2017-11-16 DIAGNOSIS — F17210 Nicotine dependence, cigarettes, uncomplicated: Secondary | ICD-10-CM | POA: Insufficient documentation

## 2017-11-16 DIAGNOSIS — Z79891 Long term (current) use of opiate analgesic: Secondary | ICD-10-CM | POA: Insufficient documentation

## 2017-11-16 DIAGNOSIS — R110 Nausea: Secondary | ICD-10-CM | POA: Diagnosis not present

## 2017-11-16 DIAGNOSIS — Z79899 Other long term (current) drug therapy: Secondary | ICD-10-CM | POA: Insufficient documentation

## 2017-11-16 DIAGNOSIS — R1012 Left upper quadrant pain: Secondary | ICD-10-CM

## 2017-11-16 DIAGNOSIS — G4733 Obstructive sleep apnea (adult) (pediatric): Secondary | ICD-10-CM | POA: Diagnosis not present

## 2017-11-16 DIAGNOSIS — R197 Diarrhea, unspecified: Secondary | ICD-10-CM | POA: Diagnosis not present

## 2017-11-16 LAB — POCT I-STAT, CHEM 8
BUN: 13 mg/dL (ref 6–20)
CALCIUM ION: 1.22 mmol/L (ref 1.15–1.40)
CHLORIDE: 102 mmol/L (ref 98–111)
CREATININE: 0.6 mg/dL (ref 0.44–1.00)
GLUCOSE: 104 mg/dL — AB (ref 70–99)
HCT: 42 % (ref 36.0–46.0)
Hemoglobin: 14.3 g/dL (ref 12.0–15.0)
POTASSIUM: 3.7 mmol/L (ref 3.5–5.1)
SODIUM: 141 mmol/L (ref 135–145)
TCO2: 30 mmol/L (ref 22–32)

## 2017-11-16 LAB — POCT URINALYSIS DIP (DEVICE)
BILIRUBIN URINE: NEGATIVE
GLUCOSE, UA: NEGATIVE mg/dL
Ketones, ur: NEGATIVE mg/dL
LEUKOCYTES UA: NEGATIVE
NITRITE: NEGATIVE
Protein, ur: NEGATIVE mg/dL
Specific Gravity, Urine: 1.015 (ref 1.005–1.030)
UROBILINOGEN UA: 0.2 mg/dL (ref 0.0–1.0)
pH: 7 (ref 5.0–8.0)

## 2017-11-16 LAB — CBC WITH DIFFERENTIAL/PLATELET
Abs Immature Granulocytes: 0 10*3/uL (ref 0.0–0.1)
BASOS ABS: 0 10*3/uL (ref 0.0–0.1)
Basophils Relative: 0 %
EOS ABS: 0.2 10*3/uL (ref 0.0–0.7)
EOS PCT: 2 %
HCT: 40.2 % (ref 36.0–46.0)
Hemoglobin: 12.9 g/dL (ref 12.0–15.0)
IMMATURE GRANULOCYTES: 0 %
LYMPHS PCT: 40 %
Lymphs Abs: 2.9 10*3/uL (ref 0.7–4.0)
MCH: 31.2 pg (ref 26.0–34.0)
MCHC: 32.1 g/dL (ref 30.0–36.0)
MCV: 97.3 fL (ref 78.0–100.0)
Monocytes Absolute: 0.5 10*3/uL (ref 0.1–1.0)
Monocytes Relative: 7 %
NEUTROS PCT: 51 %
Neutro Abs: 3.7 10*3/uL (ref 1.7–7.7)
Platelets: 304 10*3/uL (ref 150–400)
RBC: 4.13 MIL/uL (ref 3.87–5.11)
RDW: 13.7 % (ref 11.5–15.5)
WBC: 7.3 10*3/uL (ref 4.0–10.5)

## 2017-11-16 MED ORDER — ONDANSETRON HCL 4 MG PO TABS: 4.0000 mg | ORAL_TABLET | Freq: Four times a day (QID) | ORAL | 0 refills | Status: DC

## 2017-11-16 MED ORDER — ONDANSETRON 4 MG PO TBDP
4.0000 mg | ORAL_TABLET | Freq: Once | ORAL | Status: AC
  Administered 2017-11-16: 4 mg via ORAL

## 2017-11-16 MED ORDER — CIPROFLOXACIN HCL 500 MG PO TABS: 500.0000 mg | ORAL_TABLET | Freq: Two times a day (BID) | ORAL | 0 refills | Status: DC

## 2017-11-16 MED ORDER — ONDANSETRON 4 MG PO TBDP
ORAL_TABLET | ORAL | Status: AC
  Filled 2017-11-16: qty 1

## 2017-11-16 MED ORDER — FLUCONAZOLE 150 MG PO TABS: 150.0000 mg | ORAL_TABLET | Freq: Every day | ORAL | 0 refills | Status: DC

## 2017-11-16 NOTE — Discharge Instructions (Signed)
I believe your abdominal pain could be diverticulitis. Take antibiotics twice a day for 5 days Drink plenty of liquids Bland diet. Take Zofran as needed for nausea and vomiting Get plenty of rest Return promptly for increasing pain, vomiting with inability to keep down water, high fever or chills

## 2017-11-16 NOTE — ED Provider Notes (Signed)
[place]    CSN: [id number] Arrival date & time: 11/16/17  1016     History   Chief Complaint Chief Complaint  Patient presents with  . Abdominal Pain    LUQ    HPI Tiffany Gallagher is a 52 y.o. female.   HPI Patient is here for abdominal pain.  She states she is had some crampy left-sided abdominal pain for a week.  This morning she woke up and the pain is constant, and worse.  She describes the pain as "severe".  She has had 6 loose bowel movements this morning.  1 of them with mucus.  No blood.  She has had some nausea and decreased appetite.  No vomiting.  She is had no abdominal surgeries.  She did have a colonoscopy last year which revealed colon polyps, and diverticular disease.  She is never had diverticulitis.  She is on omeprazole for acid reflux.  She has been taking this daily.  This does not feel like her usual heartburn.  She states she has had some sweats and chills, thought she was running a fever.  Her temperature here is normal.  She has not eaten any food she thinks.agree with her.  No recent travel.  No recent antibiotic use.  No urinary frequency or dysuria.  No history of kidney stones. Past Medical History:  Diagnosis Date  . Allergy   . Fibroid   . Hyperlipidemia   . Hypertension   . Migraines   . Obesity, morbid, BMI 40.0-49.9 ([place])   . Sleep apnea    cpap    Patient Active Problem List   Diagnosis Date Noted  . Leg skin lesion, right 06/09/2017  . OSA (obstructive sleep apnea) 06/05/2016  . Wellness examination 02/09/2015  . Acute bacterial bronchitis 11/15/2014  . Severe obesity (BMI >= 40) ([place]) 05/10/2014  . Physical exam 01/20/2014  . 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 Migraines   . Smoker   . Sinusitis 04/11/2011  . Leg cramps 09/18/2010  . Edema 09/18/2010  . VAGINAL DISCHARGE 09/20/2009  . KNEE PAIN, BILATERAL 09/20/2009  . FLATULENCE ERUCTATION AND GAS PAIN 08/03/2009  . HEMATURIA, HX OF 07/04/2009  . Essential  hypertension 02/07/2008  . GERD 11/30/2006    Past Surgical History:  Procedure Laterality Date  . bone spur     removed  . CESAREAN SECTION  1995  . VAGINAL HYSTERECTOMY  1998   partial    OB History    Gravida  2   Para  1   Term  1   Preterm      AB  1   Living  1     SAB      TAB      Ectopic      Multiple      Live Births               Home Medications    Prior to Admission medications   Medication Sig Start Date End Date Taking? Authorizing Provider  Ascorbic Acid (VITAMIN C) 100 MG tablet Take 100 mg by mouth daily.   Yes [provider]  benazepril (LOTENSIN) 10 MG tablet TAKE 1 TABLET BY MOUTH  DAILY 04/17/17  Yes Roma Schanz R, DO  cholecalciferol (VITAMIN D) 1000 units tablet Take 6,000 Units by mouth daily.    Yes [provider]  hydrochlorothiazide (HYDRODIURIL) 25 MG tablet Take 1 tablet (25 mg total) by mouth daily. 02/11/16  Yes Saguier, Percell Miller,  PA-C  omeprazole (PRILOSEC) 20 MG capsule Take 1 capsule (20 mg total) by mouth daily. 07/29/16 12/06/18 Yes Ann Held, DO  acetaminophen (TYLENOL) 325 MG tablet Take 650 mg by mouth every 6 (six) hours as needed.    [provider]  albuterol (PROVENTIL HFA;VENTOLIN HFA) 108 (90 Base) MCG/ACT inhaler Inhale 2 puffs into the lungs every 6 (six) hours as needed for wheezing or shortness of breath. 11/27/16   Saguier, Percell Miller, PA-C  ciprofloxacin (CIPRO) 500 MG tablet Take 1 tablet (500 mg total) by mouth every 12 (twelve) hours. 11/16/17   Raylene Everts, MD  fluconazole (DIFLUCAN) 150 MG tablet Take 1 tablet (150 mg total) by mouth daily. Repeat in 1 week if needed 11/16/17   Raylene Everts, MD  fluticasone [facility]) 50 MCG/ACT nasal spray Place 2 sprays into both nostrils daily. 06/09/17   Ann Held, DO  meloxicam (MOBIC) 15 MG tablet Take 1 tablet (15 mg total) by mouth daily. 06/15/16   Bjorn Pippin, PA-C  Multiple Vitamin (MULTIVITAMIN  WITH MINERALS) TABS Take 1 tablet by mouth daily.    [provider]  ondansetron (ZOFRAN) 4 MG tablet Take 1 tablet (4 mg total) by mouth every 6 (six) hours. 11/16/17   Raylene Everts, MD  OVER THE COUNTER MEDICATION [place]    [provider]  Potassium 99 MG TABS Take by mouth daily as needed.    [provider]  thiamine (VITAMIN B-1) 100 MG tablet Take 100 mg by mouth daily.    [provider]  traMADol (ULTRAM) 50 MG tablet Take 50 mg by mouth every 6 (six) hours as needed.    [provider]  valACYclovir (VALTREX) 500 MG tablet Take twice daily for 3-5 days as needed 06/19/16   Huel Cote, NP  Vitamin D, Ergocalciferol, (DRISDOL) 50000 units CAPS capsule Take 1 capsule (50,000 Units total) by mouth 2 (two) times a week. 11/12/17   Ann Held, DO    Family History Family History  Problem Relation Age of [place]  . Coronary artery disease Mother   . Hypertension Mother   . Kidney disease Mother 21       dialysis  . Aneurysm Mother        brain  . Stroke Mother   . Heart disease Mother        MI  . Cancer Father 79       brain cancer  . Diabetes Sister   . Hyperlipidemia Sister   . Hypertension Sister   . Arthritis Brother   . Hypertension Brother   . Diabetes Brother   . Hypertension Brother   . Hypertension Sister     Social History Social History   Tobacco Use  . Smoking status: Current Every Day Smoker    Packs/day: 0.50    Years: 30.00    Pack years: 15.00    Types: Cigarettes  . Smokeless tobacco: Never Used  . Tobacco comment: cutting back  Substance Use Topics  . Alcohol use: Yes    Alcohol/week: 0.0 oz    Comment: rarely; once yearly  . Drug use: No     Allergies   Lactose intolerance (gi) and Lipitor [atorvastatin calcium]   Review of Systems Review of Systems  Constitutional: Positive for appetite change and diaphoresis. Negative for chills and fever.  HENT: Negative for congestion,  dental problem, ear pain and sore throat.   Eyes: Negative for pain and  visual disturbance.  Respiratory: Negative for cough and shortness of breath.   Cardiovascular: Negative for chest pain and palpitations.  Gastrointestinal: Positive for abdominal distention, abdominal pain, diarrhea and nausea. Negative for vomiting.  Genitourinary: Negative for difficulty urinating, dysuria and hematuria.  Musculoskeletal: Negative for arthralgias and back pain.  Skin: Negative for color change and rash.  Neurological: Negative for seizures and syncope.  All other systems reviewed and are negative.    Physical Exam Triage Vital Signs ED Triage Vitals  Enc Vitals Group     BP 11/16/17 1033 (!) 147/91     Pulse Rate 11/16/17 1033 66     Resp --      Temp 11/16/17 1033 98.8 F (37.1 C)     Temp Source 11/16/17 1033 Oral     SpO2 11/16/17 1033 97 %     Weight --      Height --      Head Circumference --      Peak Flow --      Pain Score 11/16/17 1031 7     Pain Loc --      Pain Edu? --      Excl. in [place]? --    No data found.  Updated Vital Signs BP (!) 147/91 (BP Location: Left Wrist)   Pulse 66   Temp 98.8 F (37.1 C) (Oral)   SpO2 97%       Physical Exam  Constitutional: She is oriented to person, place, and time. She appears well-developed and well-nourished. She appears ill. No distress.  Appears uncomfortable.  Obese  HENT:  Head: Normocephalic and atraumatic.  Mouth/Throat: Oropharynx is clear and moist.  Eyes: Pupils are equal, round, and reactive to light. Conjunctivae are normal.  Neck: Normal range of motion.  Cardiovascular: Normal rate and normal heart sounds.  Pulmonary/Chest: Effort normal and breath sounds normal. No respiratory distress.  Abdominal: Soft. Bowel sounds are normal. She exhibits no distension. There is no hepatosplenomegaly. There is tenderness in the left upper quadrant and left lower quadrant. There is no rebound, no guarding and no CVA tenderness.   Obese abdomen.  Tender to palpation in the left upper and middle quadrants.  No guarding or rebound.  Musculoskeletal: Normal range of motion. She exhibits no edema.  Neurological: She is alert and oriented to person, place, and time.  Skin: Skin is warm and dry.  Psychiatric: She has a normal mood and affect. Her behavior is normal.     UC Treatments / Results  Labs (all labs ordered are listed, but only abnormal results are displayed) Labs Reviewed  POCT URINALYSIS DIP (DEVICE) - Abnormal; Notable for the following components:      Result Value   Hgb urine dipstick SMALL (*)    All other components within normal limits  POCT I-STAT, CHEM 8 - Abnormal; Notable for the following components:   Glucose, Bld 104 (*)    All other components within normal limits  CBC WITH DIFFERENTIAL/PLATELET    EKG None  Radiology Dg Abdomen 1 View  Result Date: 11/16/2017 CLINICAL DATA:  Per pt: stomach cramps for over a week, diarrhea started this morning, bad headache, nausea started this morning, no fever. c section in 1995, Hysterectomy 1998. Colonoscopy was last year and was negative. EXAM: ABDOMEN - 1 VIEW COMPARISON:  CT of the abdomen and pelvis on 05/05/2006 FINDINGS: The bowel gas pattern is normal. No radio-opaque calculi or other significant radiographic abnormality are seen. IMPRESSION: Negative. Electronically Signed  By: Nolon Nations M.D.   On: 11/16/2017 11:22    Procedures Procedures (including critical care time)  Medications Ordered in UC Medications  ondansetron (ZOFRAN-ODT) disintegrating tablet 4 mg (4 mg Oral Given 11/16/17 1142)    Initial Impression / Assessment and Plan / UC Course  I have reviewed the triage vital signs and the nursing notes.  Pertinent labs & imaging results that were available during my care of the patient were reviewed by me and considered in my medical decision making (see chart for details).     Discussed with patient and her family  that her lab work and x-ray are normal.  This left-sided abdominal pain may be an early diverticulitis.  We will treat her with 7 days of Cipro.  She is told to increase her fluids.  Bland diet.  May advance as tolerated.  Zofran as needed nausea. Reasons for return are reviewed. Final Clinical Impressions(s) / UC Diagnoses   Final diagnoses:  Left upper quadrant pain  Diverticulitis of colon     Discharge Instructions     I believe your abdominal pain could be diverticulitis. Take antibiotics twice a day for 5 days Drink plenty of liquids Bland diet. Take Zofran as needed for nausea and vomiting Get plenty of rest Return promptly for increasing pain, vomiting with inability to keep down water, high fever or chills    ED Prescriptions    Medication Sig Dispense Auth. Provider   ciprofloxacin (CIPRO) 500 MG tablet Take 1 tablet (500 mg total) by mouth every 12 (twelve) hours. 10 tablet Raylene Everts, MD   ondansetron (ZOFRAN) 4 MG tablet Take 1 tablet (4 mg total) by mouth every 6 (six) hours. 12 tablet Raylene Everts, MD   fluconazole (DIFLUCAN) 150 MG tablet Take 1 tablet (150 mg total) by mouth daily. Repeat in 1 week if needed 2 tablet Raylene Everts, MD     Controlled Substance Prescriptions [place] Controlled Substance Registry consulted? Not Applicable   Raylene Everts, MD 11/16/17 1242

## 2017-11-16 NOTE — ED Notes (Signed)
Provided patient with water, unable to void at this time

## 2017-11-16 NOTE — ED Triage Notes (Signed)
Pt reports LUQ cramping x1 week.  This morning she woke up with constant LUQ pain, cramping, 5-6 soft BM's and nausea.

## 2017-11-18 ENCOUNTER — Other Ambulatory Visit: Payer: Self-pay

## 2017-11-18 MED ORDER — BENAZEPRIL HCL 10 MG PO TABS: 10.0000 mg | ORAL_TABLET | Freq: Every day | ORAL | 0 refills | Status: DC

## 2017-11-19 ENCOUNTER — Other Ambulatory Visit: Payer: Self-pay | Admitting: Family Medicine

## 2017-11-19 DIAGNOSIS — K2100 Gastro-esophageal reflux disease with esophagitis, without bleeding: Secondary | ICD-10-CM

## 2017-11-23 NOTE — Telephone Encounter (Signed)
Bone density not to be done until next year---  Every 2 years  We changed to benazapril --- I thought due to cost but we have benazapril on her med list

## 2017-11-24 MED ORDER — OMEPRAZOLE 20 MG PO CPDR: 20.0000 mg | DELAYED_RELEASE_CAPSULE | Freq: Every day | ORAL | 3 refills | Status: DC

## 2017-11-24 MED ORDER — HYDROCHLOROTHIAZIDE 25 MG PO TABS: 25.0000 mg | ORAL_TABLET | Freq: Every day | ORAL | 1 refills | Status: DC

## 2018-03-05 ENCOUNTER — Telehealth: Payer: Self-pay | Admitting: *Deleted

## 2018-03-05 NOTE — Telephone Encounter (Signed)
Copied from [place] [phone]. Topic: Appointment Scheduling - Scheduling Inquiry for Clinic >> Mar 05, 2018 11:18 AM Berneta Levins wrote: Reason for CRM:   Pt calling.  States she needs her physical exam before the end of the year to get a healthcare incentive at work.  Pt is scheduled for 01/30 and is on the wait list as well, but wants to know if she can be worked in.  Pt can be reached at [phone]

## 2018-03-09 NOTE — Telephone Encounter (Signed)
Called pt left voicemail we could do her physical on 03/15/18 @ 9:45 but she would need to let us know if this would work for her. I also can't prevent from this appt being scheduled for another patient.

## 2018-03-09 NOTE — Telephone Encounter (Signed)
Can you check 03/15/18

## 2018-03-15 ENCOUNTER — Encounter

## 2018-03-15 ENCOUNTER — Encounter: Payer: Self-pay | Admitting: Family Medicine

## 2018-03-15 ENCOUNTER — Ambulatory Visit (INDEPENDENT_AMBULATORY_CARE_PROVIDER_SITE_OTHER): Payer: BLUE CROSS/BLUE SHIELD | Admitting: Family Medicine

## 2018-03-15 VITALS — BP 132/72 | HR 72 | Temp 99.0°F | Resp 16 | Ht 65.0 in | Wt 311.6 lb

## 2018-03-15 DIAGNOSIS — K2100 Gastro-esophageal reflux disease with esophagitis, without bleeding: Secondary | ICD-10-CM

## 2018-03-15 DIAGNOSIS — G4733 Obstructive sleep apnea (adult) (pediatric): Secondary | ICD-10-CM | POA: Diagnosis not present

## 2018-03-15 DIAGNOSIS — Z6841 Body Mass Index (BMI) 40.0 and over, adult: Secondary | ICD-10-CM

## 2018-03-15 DIAGNOSIS — Z23 Encounter for immunization: Secondary | ICD-10-CM | POA: Diagnosis not present

## 2018-03-15 DIAGNOSIS — E66813 Obesity, class 3: Secondary | ICD-10-CM

## 2018-03-15 DIAGNOSIS — I10 Essential (primary) hypertension: Secondary | ICD-10-CM | POA: Diagnosis not present

## 2018-03-15 DIAGNOSIS — J4520 Mild intermittent asthma, uncomplicated: Secondary | ICD-10-CM

## 2018-03-15 DIAGNOSIS — Z0000 Encounter for general adult medical examination without abnormal findings: Secondary | ICD-10-CM | POA: Diagnosis not present

## 2018-03-15 DIAGNOSIS — B009 Herpesviral infection, unspecified: Secondary | ICD-10-CM

## 2018-03-15 LAB — LIPID PANEL
CHOL/HDL RATIO: 5
Cholesterol: 199 mg/dL (ref 0–200)
HDL: 42.1 mg/dL (ref >39.00)
LDL Cholesterol: 133 mg/dL — ABNORMAL HIGH (ref 0–99)
NONHDL: 156.92
Triglycerides: 118 mg/dL (ref 0.0–149.0)
VLDL: 23.6 mg/dL (ref 0.0–40.0)

## 2018-03-15 LAB — CBC WITH DIFFERENTIAL/PLATELET
Basophils Absolute: 0.1 10*3/uL (ref 0.0–0.1)
Basophils Relative: 1.4 % (ref 0.0–3.0)
EOS ABS: 0.2 10*3/uL (ref 0.0–0.7)
Eosinophils Relative: 2.3 % (ref 0.0–5.0)
HCT: 36.7 % (ref 36.0–46.0)
Hemoglobin: 12.3 g/dL (ref 12.0–15.0)
LYMPHS ABS: 2.9 10*3/uL (ref 0.7–4.0)
LYMPHS PCT: 41.6 % (ref 12.0–46.0)
MCHC: 33.6 g/dL (ref 30.0–36.0)
MCV: 93.5 fl (ref 78.0–100.0)
Monocytes Absolute: 0.4 10*3/uL (ref 0.1–1.0)
Monocytes Relative: 5.2 % (ref 3.0–12.0)
NEUTROS ABS: 3.4 10*3/uL (ref 1.4–7.7)
NEUTROS PCT: 49.5 % (ref 43.0–77.0)
PLATELETS: 304 10*3/uL (ref 150.0–400.0)
RBC: 3.93 Mil/uL (ref 3.87–5.11)
RDW: 14.5 % (ref 11.5–15.5)
WBC: 7 10*3/uL (ref 4.0–10.5)

## 2018-03-15 LAB — COMPREHENSIVE METABOLIC PANEL
ALT: 12 U/L (ref 0–35)
AST: 12 U/L (ref 0–37)
Albumin: 3.9 g/dL (ref 3.5–5.2)
Alkaline Phosphatase: 60 U/L (ref 39–117)
BUN: 10 mg/dL (ref 6–23)
CO2: 28 meq/L (ref 19–32)
CREATININE: 0.57 mg/dL (ref 0.40–1.20)
Calcium: 9.1 mg/dL (ref 8.4–10.5)
Chloride: 104 mEq/L (ref 96–112)
GFR: 143.1 mL/min (ref >60.00)
GLUCOSE: 118 mg/dL — AB (ref 70–99)
Potassium: 3.7 mEq/L (ref 3.5–5.1)
Sodium: 140 mEq/L (ref 135–145)
Total Bilirubin: 0.5 mg/dL (ref 0.2–1.2)
Total Protein: 7 g/dL (ref 6.0–8.3)

## 2018-03-15 LAB — TSH: TSH: 1.51 u[IU]/mL (ref 0.35–4.50)

## 2018-03-15 MED ORDER — ALBUTEROL SULFATE HFA 108 (90 BASE) MCG/ACT IN AERS: 2.0000 | INHALATION_SPRAY | Freq: Four times a day (QID) | RESPIRATORY_TRACT | 0 refills | Status: DC | PRN

## 2018-03-15 MED ORDER — BENAZEPRIL HCL 10 MG PO TABS: 10.0000 mg | ORAL_TABLET | Freq: Every day | ORAL | 1 refills | Status: DC

## 2018-03-15 MED ORDER — ACYCLOVIR 5 % EX OINT: TOPICAL_OINTMENT | CUTANEOUS | 2 refills | Status: DC

## 2018-03-15 MED ORDER — HYDROCHLOROTHIAZIDE 25 MG PO TABS: 25.0000 mg | ORAL_TABLET | Freq: Every day | ORAL | 1 refills | Status: DC

## 2018-03-15 MED ORDER — OMEPRAZOLE 20 MG PO CPDR: 20.0000 mg | DELAYED_RELEASE_CAPSULE | Freq: Every day | ORAL | 3 refills | Status: DC

## 2018-03-15 NOTE — Patient Instructions (Signed)
Preventive Care 40-64 Years, Female Preventive care refers to lifestyle choices and visits with your health care provider that can promote health and wellness. What does preventive care include?  A yearly physical exam. This is also called an annual well check.  Dental exams once or twice a year.  Routine eye exams. Ask your health care provider how often you should have your eyes checked.  Personal lifestyle choices, including: ? Daily care of your teeth and gums. ? Regular physical activity. ? Eating a healthy diet. ? Avoiding tobacco and drug use. ? Limiting alcohol use. ? Practicing safe sex. ? Taking low-dose aspirin daily starting at age 39. ? Taking vitamin and mineral supplements as recommended by your health care provider. What happens during an annual well check? The services and screenings done by your health care provider during your annual well check will depend on your age, overall health, lifestyle risk factors, and family history of disease. Counseling Your health care provider may ask you questions about your:  Alcohol use.  Tobacco use.  Drug use.  Emotional well-being.  Home and relationship well-being.  Sexual activity.  Eating habits.  Work and work Statistician.  Method of birth control.  Menstrual cycle.  Pregnancy history.  Screening You may have the following tests or measurements:  Height, weight, and BMI.  Blood pressure.  Lipid and cholesterol levels. These may be checked every 5 years, or more frequently if you are over 16 years old.  Skin check.  Lung cancer screening. You may have this screening every year starting at age 64 if you have a 30-pack-year history of smoking and currently smoke or have quit within the past 15 years.  Fecal occult blood test (FOBT) of the stool. You may have this test every year starting at age 46.  Flexible sigmoidoscopy or colonoscopy. You may have a sigmoidoscopy every 5 years or a colonoscopy  every 10 years starting at age 53.  Hepatitis C blood test.  Hepatitis B blood test.  Sexually transmitted disease (STD) testing.  Diabetes screening. This is done by checking your blood sugar (glucose) after you have not eaten for a while (fasting). You may have this done every 1-3 years.  Mammogram. This may be done every 1-2 years. Talk to your health care provider about when you should start having regular mammograms. This may depend on whether you have a family history of breast cancer.  BRCA-related cancer screening. This may be done if you have a family history of breast, ovarian, tubal, or peritoneal cancers.  Pelvic exam and Pap test. This may be done every 3 years starting at age 45. Starting at age 28, this may be done every 5 years if you have a Pap test in combination with an HPV test.  Bone density scan. This is done to screen for osteoporosis. You may have this scan if you are at high risk for osteoporosis.  Discuss your test results, treatment options, and if necessary, the need for more tests with your health care provider. Vaccines Your health care provider may recommend certain vaccines, such as:  Influenza vaccine. This is recommended every year.  Tetanus, diphtheria, and acellular pertussis (Tdap, Td) vaccine. You may need a Td booster every 10 years.  Varicella vaccine. You may need this if you have not been vaccinated.  Zoster vaccine. You may need this after age 82.  Measles, mumps, and rubella (MMR) vaccine. You may need at least one dose of MMR if you were born in  1957 or later. You may also need a second dose.  Pneumococcal 13-valent conjugate (PCV13) vaccine. You may need this if you have certain conditions and were not previously vaccinated.  Pneumococcal polysaccharide (PPSV23) vaccine. You may need one or two doses if you smoke cigarettes or if you have certain conditions.  Meningococcal vaccine. You may need this if you have certain  conditions.  Hepatitis A vaccine. You may need this if you have certain conditions or if you travel or work in places where you may be exposed to hepatitis A.  Hepatitis B vaccine. You may need this if you have certain conditions or if you travel or work in places where you may be exposed to hepatitis B.  Haemophilus influenzae type b (Hib) vaccine. You may need this if you have certain conditions.  Talk to your health care provider about which screenings and vaccines you need and how often you need them. This information is not intended to replace advice given to you by your health care provider. Make sure you discuss any questions you have with your health care provider. Document Released: 05/04/2015 Document Revised: 12/26/2015 Document Reviewed: 02/06/2015 Elsevier Interactive Patient Education  [employer].

## 2018-03-15 NOTE — Assessment & Plan Note (Signed)
On cpap 

## 2018-03-15 NOTE — Assessment & Plan Note (Signed)
ghm utd Check labs  See AVs  

## 2018-03-15 NOTE — Progress Notes (Signed)
Subjective:     Tiffany Gallagher is a 52 y.o. female and is here for a comprehensive physical exam. The patient reports no problems.  Social History   Socioeconomic History  . Marital status: Divorced    Spouse name: Not on file  . Number of children: 1  . Years of education: 45  . Highest education level: Not on file  Occupational History  . Occupation: Therapist, art   Social Needs  . Financial resource strain: Not on file  . Food insecurity:    Worry: Not on file    Inability: Not on file  . Transportation needs:    Medical: Not on file    Non-medical: Not on file  Tobacco Use  . Smoking status: Current Every Day Smoker    Packs/day: 0.50    Years: 30.00    Pack years: 15.00    Types: Cigarettes  . Smokeless tobacco: Never Used  . Tobacco comment: cutting back  Substance and Sexual Activity  . Alcohol use: Yes    Alcohol/week: 0.0 standard drinks    Comment: rarely; once yearly  . Drug use: No  . Sexual activity: Yes    Partners: Male    Birth control/protection: Surgical, Condom  Lifestyle  . Physical activity:    Days per week: Not on file    Minutes per session: Not on file  . Stress: Not on file  Relationships  . Social connections:    Talks on phone: Not on file    Gets together: Not on file    Attends religious service: Not on file    Active member of club or organization: Not on file    Attends meetings of clubs or organizations: Not on file    Relationship status: Not on file  . Intimate partner violence:    Fear of current or ex partner: Not on file    Emotionally abused: Not on file    Physically abused: Not on file    Forced sexual activity: Not on file  Other Topics Concern  . Not on file  Social History Narrative   Exercise-- no   Health Maintenance  Topic Date Due  . TETANUS/TDAP  02/06/2018  . INFLUENZA VACCINE  11/20/2018 (Originally 11/19/2017)  . MAMMOGRAM  07/14/2018  . COLONOSCOPY  05/19/2026  . HIV Screening  Completed     The following portions of the patient's history were reviewed and updated as appropriate: She  has a past medical history of Allergy, Diverticulitis, Fibroid, Hyperlipidemia, Hypertension, Migraines, Obesity, morbid, BMI 40.0-49.9 ([place]), and Sleep apnea. She does not have any pertinent problems on file. She  has a past surgical history that includes Cesarean section (1995); Vaginal hysterectomy (1998); and bone spur. Her family history includes Aneurysm in her mother; Arthritis in her brother; Cancer (age of onset: 109) in her father; Coronary artery disease in her mother; Diabetes in her brother and sister; Heart disease in her mother; Hyperlipidemia in her sister; Hypertension in her brother, brother, mother, sister, and sister; Kidney disease (age of onset: 36) in her mother; Stroke in her mother. She  reports that she has been smoking cigarettes. She has a 15.00 pack-year smoking history. She has never used smokeless tobacco. She reports that she drinks alcohol. She reports that she does not use drugs. She has a current medication list which includes the following prescription(s): acetaminophen, albuterol, vitamin c, benazepril, fluticasone, hydrochlorothiazide, meloxicam, multivitamin with minerals, omeprazole, OVER THE COUNTER MEDICATION, potassium, thiamine, tramadol, valacyclovir, vitamin d (  ergocalciferol), and acyclovir ointment, and the following Facility-Administered Medications: sodium chloride. Current Outpatient Medications on File Prior to Visit  Medication Sig Dispense Refill  . acetaminophen (TYLENOL) 325 MG tablet Take 650 mg by mouth every 6 (six) hours as needed.    . Ascorbic Acid (VITAMIN C) 100 MG tablet Take 100 mg by mouth daily.    . fluticasone (FLONASE) 50 MCG/ACT nasal spray Place 2 sprays into both nostrils daily. 16 g 6  . meloxicam (MOBIC) 15 MG tablet Take 1 tablet (15 mg total) by mouth daily. 30 tablet 0  . Multiple Vitamin (MULTIVITAMIN WITH MINERALS) TABS Take 1  tablet by mouth daily.    Marland Kitchen OVER THE COUNTER MEDICATION Beano    . Potassium 99 MG TABS Take by mouth daily as needed.    . thiamine (VITAMIN B-1) 100 MG tablet Take 100 mg by mouth daily.    . traMADol (ULTRAM) 50 MG tablet Take 50 mg by mouth every 6 (six) hours as needed.    . valACYclovir (VALTREX) 500 MG tablet Take twice daily for 3-5 days as needed 90 tablet 4  . Vitamin D, Ergocalciferol, (DRISDOL) 50000 units CAPS capsule Take 1 capsule (50,000 Units total) by mouth 2 (two) times a week. 24 capsule 0   Current Facility-Administered Medications on File Prior to Visit  Medication Dose Route Frequency Provider Last Rate Last Dose  . 0.9 %  sodium chloride infusion  500 mL Intravenous Continuous Milus Banister, MD       She is allergic to lactose intolerance (gi) and lipitor [atorvastatin calcium]..  Review of Systems Review of Systems  Constitutional: Negative for activity change, appetite change and fatigue.  HENT: Negative for hearing loss, congestion, tinnitus and ear discharge.  dentist q55m Eyes: Negative for visual disturbance (see optho q1y -- vision corrected to 20/20 with glasses).  Respiratory: Negative for cough, chest tightness and shortness of breath.   Cardiovascular: Negative for chest pain, palpitations and leg swelling.  Gastrointestinal: Negative for abdominal pain, diarrhea, constipation and abdominal distention.  Genitourinary: Negative for urgency, frequency, decreased urine volume and difficulty urinating.  Musculoskeletal: Negative for back pain, arthralgias and gait problem.  Skin: Negative for color change, pallor and rash.  Neurological: Negative for dizziness, light-headedness, numbness and headaches.  Hematological: Negative for adenopathy. Does not bruise/bleed easily.  Psychiatric/Behavioral: Negative for suicidal ideas, confusion, sleep disturbance, self-injury, dysphoric mood, decreased concentration and agitation.       Objective:    BP  132/72 (BP Location: Right Arm, Cuff Size: Large)   Pulse 72   Temp 99 F (37.2 C) (Oral)   Resp 16   Ht 5\' 5"  (1.651 m)   Wt (!) 311 lb 9.6 oz (141.3 kg)   SpO2 99%   BMI 51.85 kg/m  General appearance: alert, cooperative, appears stated age and no distress Head: Normocephalic, without obvious abnormality, atraumatic Eyes: conjunctivae/corneas clear. PERRL, EOM's intact. Fundi benign. Ears: normal TM's and external ear canals both ears Nose: Nares normal. Septum midline. Mucosa normal. No drainage or sinus tenderness. Throat: lips, mucosa, and tongue normal; teeth and gums normal Neck: no adenopathy, no carotid bruit, no JVD, supple, symmetrical, trachea midline and thyroid not enlarged, symmetric, no tenderness/mass/nodules Back: symmetric, no curvature. ROM normal. No CVA tenderness. Lungs: clear to auscultation bilaterally Breasts: normal appearance, no masses or tenderness Heart: regular rate and rhythm, S1, S2 normal, no murmur, click, rub or gallop Abdomen: soft, non-tender; bowel sounds normal; no masses,  no organomegaly Pelvic:  deferred Extremities: extremities normal, atraumatic, no cyanosis or edema Pulses: 2+ and symmetric Skin: Skin color, texture, turgor normal. No rashes or lesions Lymph nodes: Cervical, supraclavicular, and axillary nodes normal. Neurologic: Alert and oriented X 3, normal strength and tone. Normal symmetric reflexes. Normal coordination and gait    Assessment:    Healthy female exam.      Plan:     ghm utd Check labs See After Visit Summary for Counseling Recommendations    1. Preventative health care Ghm utd  Check labs  - CBC with Differential/Platelet - Comprehensive metabolic panel - Lipid panel - TSH  2. Need for Tdap vaccination  - Tdap vaccine greater than or equal to 7yo IM  3. Essential hypertension Well controlled, no changes to meds. Encouraged heart healthy diet such as the DASH diet and exercise as tolerated.  - CBC  with Differential/Platelet - Comprehensive metabolic panel - Lipid panel - TSH - benazepril (LOTENSIN) 10 MG tablet; Take 1 tablet (10 mg total) by mouth daily.  Dispense: 90 tablet; Refill: 1 - hydrochlorothiazide (HYDRODIURIL) 25 MG tablet; Take 1 tablet (25 mg total) by mouth daily.  Dispense: 90 tablet; Refill: 1  4. OSA (obstructive sleep apnea) Stable  Using cpap  5. Class 3 severe obesity due to excess calories with serious comorbidity and body mass index (BMI) of 45.0 to 49.9 in adult (HCC)  - Amb Ref to Medical Weight Mangement  6. Gastroesophageal reflux disease with esophagitis  - omeprazole (PRILOSEC) 20 MG capsule; Take 1 capsule (20 mg total) by mouth daily.  Dispense: 90 capsule; Refill: 3  7. Mild intermittent asthma without complication  - albuterol (PROVENTIL HFA;VENTOLIN HFA) 108 (90 Base) MCG/ACT inhaler; Inhale 2 puffs into the lungs every 6 (six) hours as needed for wheezing or shortness of breath.  Dispense: 1 Inhaler; Refill: 0  8. HSV-2 infection Using valtrex for breakouts She had a reaction when she tried to take it everyday - acyclovir ointment (ZOVIRAX) 5 %; Apply tid prn  Dispense: 15 g; Refill: 2

## 2018-03-17 ENCOUNTER — Encounter: Payer: Self-pay | Admitting: Family Medicine

## 2018-03-17 ENCOUNTER — Other Ambulatory Visit: Payer: Self-pay | Admitting: Family Medicine

## 2018-03-17 DIAGNOSIS — E785 Hyperlipidemia, unspecified: Secondary | ICD-10-CM

## 2018-03-17 MED ORDER — ROSUVASTATIN CALCIUM 10 MG PO TABS
10.0000 mg | ORAL_TABLET | Freq: Every day | ORAL | 0 refills | Status: DC
Start: 2018-03-17 — End: 2018-06-28

## 2018-03-23 ENCOUNTER — Other Ambulatory Visit: Payer: Self-pay | Admitting: Family Medicine

## 2018-03-23 DIAGNOSIS — E782 Mixed hyperlipidemia: Secondary | ICD-10-CM

## 2018-03-23 DIAGNOSIS — R739 Hyperglycemia, unspecified: Secondary | ICD-10-CM

## 2018-04-02 DIAGNOSIS — G4733 Obstructive sleep apnea (adult) (pediatric): Secondary | ICD-10-CM | POA: Diagnosis not present

## 2018-05-20 ENCOUNTER — Encounter: Payer: BLUE CROSS/BLUE SHIELD | Admitting: Family Medicine

## 2018-06-17 ENCOUNTER — Other Ambulatory Visit: Payer: Self-pay | Admitting: Family Medicine

## 2018-06-17 DIAGNOSIS — Z1231 Encounter for screening mammogram for malignant neoplasm of breast: Secondary | ICD-10-CM

## 2018-06-21 ENCOUNTER — Encounter: Payer: Self-pay | Admitting: Family Medicine

## 2018-06-21 NOTE — Telephone Encounter (Signed)
i'm guessing f/u and labs since last visit was in May--- can someone f/u on this

## 2018-06-22 ENCOUNTER — Other Ambulatory Visit (INDEPENDENT_AMBULATORY_CARE_PROVIDER_SITE_OTHER): Payer: BLUE CROSS/BLUE SHIELD

## 2018-06-22 DIAGNOSIS — R739 Hyperglycemia, unspecified: Secondary | ICD-10-CM | POA: Diagnosis not present

## 2018-06-22 DIAGNOSIS — E782 Mixed hyperlipidemia: Secondary | ICD-10-CM | POA: Diagnosis not present

## 2018-06-22 LAB — LIPID PANEL
Cholesterol: 194 mg/dL (ref 0–200)
HDL: 32.5 mg/dL — AB (ref >39.00)
LDL Cholesterol: 131 mg/dL — ABNORMAL HIGH (ref 0–99)
NONHDL: 161.02
TRIGLYCERIDES: 148 mg/dL (ref 0.0–149.0)
Total CHOL/HDL Ratio: 6
VLDL: 29.6 mg/dL (ref 0.0–40.0)

## 2018-06-22 LAB — COMPREHENSIVE METABOLIC PANEL
ALK PHOS: 59 U/L (ref 39–117)
ALT: 23 U/L (ref 0–35)
AST: 18 U/L (ref 0–37)
Albumin: 4 g/dL (ref 3.5–5.2)
BILIRUBIN TOTAL: 0.6 mg/dL (ref 0.2–1.2)
BUN: 11 mg/dL (ref 6–23)
CALCIUM: 9.5 mg/dL (ref 8.4–10.5)
CO2: 27 mEq/L (ref 19–32)
Chloride: 102 mEq/L (ref 96–112)
Creatinine, Ser: 0.6 mg/dL (ref 0.40–1.20)
GFR: 126.77 mL/min (ref >60.00)
GLUCOSE: 116 mg/dL — AB (ref 70–99)
Potassium: 3.3 mEq/L — ABNORMAL LOW (ref 3.5–5.1)
Sodium: 139 mEq/L (ref 135–145)
TOTAL PROTEIN: 6.9 g/dL (ref 6.0–8.3)

## 2018-06-28 ENCOUNTER — Encounter: Payer: Self-pay | Admitting: Women's Health

## 2018-06-28 ENCOUNTER — Ambulatory Visit (INDEPENDENT_AMBULATORY_CARE_PROVIDER_SITE_OTHER): Payer: BLUE CROSS/BLUE SHIELD | Admitting: Women's Health

## 2018-06-28 VITALS — BP 124/80 | Ht 65.0 in | Wt 293.0 lb

## 2018-06-28 DIAGNOSIS — Z113 Encounter for screening for infections with a predominantly sexual mode of transmission: Secondary | ICD-10-CM

## 2018-06-28 DIAGNOSIS — Z01419 Encounter for gynecological examination (general) (routine) without abnormal findings: Secondary | ICD-10-CM | POA: Diagnosis not present

## 2018-06-28 DIAGNOSIS — B009 Herpesviral infection, unspecified: Secondary | ICD-10-CM | POA: Diagnosis not present

## 2018-06-28 MED ORDER — ACYCLOVIR 5 % EX OINT: TOPICAL_OINTMENT | CUTANEOUS | 2 refills | Status: DC

## 2018-06-28 NOTE — Patient Instructions (Signed)
Health Maintenance for Postmenopausal Women Menopause is a normal process in which your reproductive ability comes to an end. This process happens gradually over a span of months to years, usually between the ages of 62 and 89. Menopause is complete when you have missed 12 consecutive menstrual periods. It is important to talk with your health care provider about some of the most common conditions that affect postmenopausal women, such as heart disease, cancer, and bone loss (osteoporosis). Adopting a healthy lifestyle and getting preventive care can help to promote your health and wellness. Those actions can also lower your chances of developing some of these common conditions. What should I know about menopause? During menopause, you may experience a number of symptoms, such as:  Moderate-to-severe hot flashes.  Night sweats.  Decrease in sex drive.  Mood swings.  Headaches.  Tiredness.  Irritability.  Memory problems.  Insomnia. Choosing to treat or not to treat menopausal changes is an individual decision that you make with your health care provider. What should I know about hormone replacement therapy and supplements? Hormone therapy products are effective for treating symptoms that are associated with menopause, such as hot flashes and night sweats. Hormone replacement carries certain risks, especially as you become older. If you are thinking about using estrogen or estrogen with progestin treatments, discuss the benefits and risks with your health care provider. What should I know about heart disease and stroke? Heart disease, heart attack, and stroke become more likely as you age. This may be due, in part, to the hormonal changes that your body experiences during menopause. These can affect how your body processes dietary fats, triglycerides, and cholesterol. Heart attack and stroke are both medical emergencies. There are many things that you can do to help prevent heart disease  and stroke:  Have your blood pressure checked at least every 1-2 years. High blood pressure causes heart disease and increases the risk of stroke.  If you are 79-72 years old, ask your health care provider if you should take aspirin to prevent a heart attack or a stroke.  Do not use any tobacco products, including cigarettes, chewing tobacco, or electronic cigarettes. If you need help quitting, ask your health care provider.  It is important to eat a healthy diet and maintain a healthy weight. ? Be sure to include plenty of vegetables, fruits, low-fat dairy products, and lean protein. ? Avoid eating foods that are high in solid fats, added sugars, or salt (sodium).  Get regular exercise. This is one of the most important things that you can do for your health. ? Try to exercise for at least 150 minutes each week. The type of exercise that you do should increase your heart rate and make you sweat. This is known as moderate-intensity exercise. ? Try to do strengthening exercises at least twice each week. Do these in addition to the moderate-intensity exercise.  Know your numbers.Ask your health care provider to check your cholesterol and your blood glucose. Continue to have your blood tested as directed by your health care provider.  What should I know about cancer screening? There are several types of cancer. Take the following steps to reduce your risk and to catch any cancer development as early as possible. Breast Cancer  Practice breast self-awareness. ? This means understanding how your breasts normally appear and feel. ? It also means doing regular breast self-exams. Let your health care provider know about any changes, no matter how small.  If you are 40 or  older, have a clinician do a breast exam (clinical breast exam or CBE) every year. Depending on your age, family history, and medical history, it may be recommended that you also have a yearly breast X-ray (mammogram).  If you  have a family history of breast cancer, talk with your health care provider about genetic screening.  If you are at high risk for breast cancer, talk with your health care provider about having an MRI and a mammogram every year.  Breast cancer (BRCA) gene test is recommended for women who have family members with BRCA-related cancers. Results of the assessment will determine the need for genetic counseling and BRCA1 and for BRCA2 testing. BRCA-related cancers include these types: ? Breast. This occurs in males or females. ? Ovarian. ? Tubal. This may also be called fallopian tube cancer. ? Cancer of the abdominal or pelvic lining (peritoneal cancer). ? Prostate. ? Pancreatic. Cervical, Uterine, and Ovarian Cancer Your health care provider may recommend that you be screened regularly for cancer of the pelvic organs. These include your ovaries, uterus, and vagina. This screening involves a pelvic exam, which includes checking for microscopic changes to the surface of your cervix (Pap test).  For women ages 21-65, health care providers may recommend a pelvic exam and a Pap test every three years. For women ages 39-65, they may recommend the Pap test and pelvic exam, combined with testing for human papilloma virus (HPV), every five years. Some types of HPV increase your risk of cervical cancer. Testing for HPV may also be done on women of any age who have unclear Pap test results.  Other health care providers may not recommend any screening for nonpregnant women who are considered low risk for pelvic cancer and have no symptoms. Ask your health care provider if a screening pelvic exam is right for you.  If you have had past treatment for cervical cancer or a condition that could lead to cancer, you need Pap tests and screening for cancer for at least 20 years after your treatment. If Pap tests have been discontinued for you, your risk factors (such as having a new sexual partner) need to be reassessed  to determine if you should start having screenings again. Some women have medical problems that increase the chance of getting cervical cancer. In these cases, your health care provider may recommend that you have screening and Pap tests more often.  If you have a family history of uterine cancer or ovarian cancer, talk with your health care provider about genetic screening.  If you have vaginal bleeding after reaching menopause, tell your health care provider.  There are currently no reliable tests available to screen for ovarian cancer. Lung Cancer Lung cancer screening is recommended for adults 57-50 years old who are at high risk for lung cancer because of a history of smoking. A yearly low-dose CT scan of the lungs is recommended if you:  Currently smoke.  Have a history of at least 30 pack-years of smoking and you currently smoke or have quit within the past 15 years. A pack-year is smoking an average of one pack of cigarettes per day for one year. Yearly screening should:  Continue until it has been 15 years since you quit.  Stop if you develop a health problem that would prevent you from having lung cancer treatment. Colorectal Cancer  This type of cancer can be detected and can often be prevented.  Routine colorectal cancer screening usually begins at age 12 and continues through  age 75.  If you have risk factors for colon cancer, your health care provider may recommend that you be screened at an earlier age.  If you have a family history of colorectal cancer, talk with your health care provider about genetic screening.  Your health care provider may also recommend using home test kits to check for hidden blood in your stool.  A small camera at the end of a tube can be used to examine your colon directly (sigmoidoscopy or colonoscopy). This is done to check for the earliest forms of colorectal cancer.  Direct examination of the colon should be repeated every 5-10 years until  age 75. However, if early forms of precancerous polyps or small growths are found or if you have a family history or genetic risk for colorectal cancer, you may need to be screened more often. Skin Cancer  Check your skin from head to toe regularly.  Monitor any moles. Be sure to tell your health care provider: ? About any new moles or changes in moles, especially if there is a change in a mole's shape or color. ? If you have a mole that is larger than the size of a pencil eraser.  If any of your family members has a history of skin cancer, especially at a Nahom Carfagno age, talk with your health care provider about genetic screening.  Always use sunscreen. Apply sunscreen liberally and repeatedly throughout the day.  Whenever you are outside, protect yourself by wearing long sleeves, pants, a wide-brimmed hat, and sunglasses. What should I know about osteoporosis? Osteoporosis is a condition in which bone destruction happens more quickly than new bone creation. After menopause, you may be at an increased risk for osteoporosis. To help prevent osteoporosis or the bone fractures that can happen because of osteoporosis, the following is recommended:  If you are 19-50 years old, get at least 1,000 mg of calcium and at least 600 mg of vitamin D per day.  If you are older than age 50 but younger than age 70, get at least 1,200 mg of calcium and at least 600 mg of vitamin D per day.  If you are older than age 70, get at least 1,200 mg of calcium and at least 800 mg of vitamin D per day. Smoking and excessive alcohol intake increase the risk of osteoporosis. Eat foods that are rich in calcium and vitamin D, and do weight-bearing exercises several times each week as directed by your health care provider. What should I know about how menopause affects my mental health? Depression may occur at any age, but it is more common as you become older. Common symptoms of depression include:  Low or sad  mood.  Changes in sleep patterns.  Changes in appetite or eating patterns.  Feeling an overall lack of motivation or enjoyment of activities that you previously enjoyed.  Frequent crying spells. Talk with your health care provider if you think that you are experiencing depression. What should I know about immunizations? It is important that you get and maintain your immunizations. These include:  Tetanus, diphtheria, and pertussis (Tdap) booster vaccine.  Influenza every year before the flu season begins.  Pneumonia vaccine.  Shingles vaccine. Your health care provider may also recommend other immunizations. This information is not intended to replace advice given to you by your health care provider. Make sure you discuss any questions you have with your health care provider. Document Released: 05/30/2005 Document Revised: 10/26/2015 Document Reviewed: 01/09/2015 Elsevier Interactive Patient Education    2019 [place].

## 2018-06-28 NOTE — Progress Notes (Signed)
Tiffany Gallagher 1966-01-28 [id number]    History:    Presents for annual exam.  1990 TVH for fibroids on no HRT.  Normal Pap and mammogram history.  Reports normal DEXA at primary care.  Primary care manages hypertension, hypercholesteremia, GERD.  HSV 1 no outbreaks.  2018 benign colon polyp 10-year follow-up.  Smokes less than half pack daily.  Vitamin D low currently finishing up 50,000 IUs.  Same partner, requests STD screen.  Past medical history, past surgical history, family history and social history were all reviewed and documented in the EPIC chart.  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  4 siblings hypertension.  Parents deceased, mother hypertension, stroke kidney disease, father brain cancer.  ROS:  A ROS was performed and pertinent positives and negatives are included.  Exam:  Vitals:   06/28/18 1555  BP: 124/80  Weight: 293 lb (132.9 kg)  Height: 5\' 5"  (1.651 m)   Body mass index is 48.76 kg/m.   General appearance:  Normal Thyroid:  Symmetrical, normal in size, without palpable masses or nodularity. Respiratory  Auscultation:  Clear without wheezing or rhonchi Cardiovascular  Auscultation:  Regular rate, without rubs, murmurs or gallops  Edema/varicosities:  Not grossly evident Abdominal  Soft,nontender, without masses, guarding or rebound.  Liver/spleen:  No organomegaly noted  Hernia:  None appreciated  Skin  Inspection:  Grossly normal   Breasts: Examined lying and sitting.     Right: Without masses, retractions, discharge or axillary adenopathy.     Left: Without masses, retractions, discharge or axillary adenopathy. Gentitourinary   Inguinal/mons:  Normal without inguinal adenopathy  External genitalia:  Normal  BUS/Urethra/Skene's glands:  Normal  Vagina:  Normal  Cervix: And uterus absent adnexa/parametria:     Rt: Without masses or tenderness.   Lt: Without masses or tenderness.  Anus and perineum: Normal  Digital rectal exam: Normal sphincter tone  without palpated masses or tenderness  Assessment/Plan:  53 y.o. SBF G2, P1 for annual exam with no complaints.  1990 TVH for fibroids no HRT Hypertension, hypercholesteremia, GERD-primary care manages labs and meds Smoker less than half pack daily Obesity HSV 1 STD screen  Plan: Congratulated on 15 pound weight loss with diet and exercise, encouraged to continue decreasing calorie/carbs and increasing regular exercise encouraged walking daily.  SBEs, continue annual screening mammogram has scheduled.  Continue vitamin D supplement, home safety, fall prevention discussed.  Zovirax 5% ointment as needed prescription, proper use given and reviewed.  Aware of hazards of smoking is decreasing and plans to stop.  GC/chlamydia from urethra, HIV, RPR.    Huel Cote [facility], 5:20 PM 06/28/2018

## 2018-06-29 LAB — C. TRACHOMATIS/N. GONORRHOEAE RNA
C. TRACHOMATIS RNA, TMA: NOT DETECTED
N. GONORRHOEAE RNA, TMA: NOT DETECTED

## 2018-06-29 LAB — HIV ANTIBODY (ROUTINE TESTING W REFLEX): HIV: NONREACTIVE

## 2018-06-29 LAB — RPR: RPR: NONREACTIVE

## 2018-07-26 ENCOUNTER — Ambulatory Visit: Payer: BLUE CROSS/BLUE SHIELD

## 2018-08-30 ENCOUNTER — Ambulatory Visit
Admission: RE | Admit: 2018-08-30 | Discharge: 2018-08-30 | Disposition: A | Discharge status: Home / Self Care | Payer: BLUE CROSS/BLUE SHIELD | Source: Ambulatory Visit | Attending: Family Medicine | Admitting: Family Medicine

## 2018-08-30 ENCOUNTER — Other Ambulatory Visit: Payer: Self-pay

## 2018-08-30 DIAGNOSIS — Z1231 Encounter for screening mammogram for malignant neoplasm of breast: Secondary | ICD-10-CM | POA: Diagnosis not present

## 2018-09-06 ENCOUNTER — Ambulatory Visit: Payer: BLUE CROSS/BLUE SHIELD

## 2018-09-16 ENCOUNTER — Ambulatory Visit: Payer: BLUE CROSS/BLUE SHIELD | Admitting: Family Medicine

## 2018-11-04 DIAGNOSIS — G4733 Obstructive sleep apnea (adult) (pediatric): Secondary | ICD-10-CM | POA: Diagnosis not present

## 2019-03-21 ENCOUNTER — Encounter: Payer: Self-pay | Admitting: Family Medicine

## 2019-03-21 ENCOUNTER — Other Ambulatory Visit: Payer: Self-pay

## 2019-03-21 ENCOUNTER — Ambulatory Visit (INDEPENDENT_AMBULATORY_CARE_PROVIDER_SITE_OTHER): Payer: BC Managed Care – PPO | Admitting: Family Medicine

## 2019-03-21 VITALS — BP 126/64 | HR 67 | Temp 96.9°F | Resp 12 | Ht 65.0 in | Wt 257.2 lb

## 2019-03-21 DIAGNOSIS — E785 Hyperlipidemia, unspecified: Secondary | ICD-10-CM | POA: Diagnosis not present

## 2019-03-21 DIAGNOSIS — Z0000 Encounter for general adult medical examination without abnormal findings: Secondary | ICD-10-CM

## 2019-03-21 DIAGNOSIS — I10 Essential (primary) hypertension: Secondary | ICD-10-CM

## 2019-03-21 DIAGNOSIS — E559 Vitamin D deficiency, unspecified: Secondary | ICD-10-CM | POA: Diagnosis not present

## 2019-03-21 LAB — COMPREHENSIVE METABOLIC PANEL
ALT: 8 U/L (ref 0–35)
AST: 11 U/L (ref 0–37)
Albumin: 3.9 g/dL (ref 3.5–5.2)
Alkaline Phosphatase: 54 U/L (ref 39–117)
BUN: 14 mg/dL (ref 6–23)
CO2: 27 mEq/L (ref 19–32)
Calcium: 9.5 mg/dL (ref 8.4–10.5)
Chloride: 101 mEq/L (ref 96–112)
Creatinine, Ser: 0.5 mg/dL (ref 0.40–1.20)
GFR: 156.01 mL/min (ref >60.00)
Glucose, Bld: 75 mg/dL (ref 70–99)
Potassium: 3.3 mEq/L — ABNORMAL LOW (ref 3.5–5.1)
Sodium: 138 mEq/L (ref 135–145)
Total Bilirubin: 0.6 mg/dL (ref 0.2–1.2)
Total Protein: 7.1 g/dL (ref 6.0–8.3)

## 2019-03-21 LAB — CBC WITH DIFFERENTIAL/PLATELET
Basophils Absolute: 0.1 10*3/uL (ref 0.0–0.1)
Basophils Relative: 1 % (ref 0.0–3.0)
Eosinophils Absolute: 0.1 10*3/uL (ref 0.0–0.7)
Eosinophils Relative: 2 % (ref 0.0–5.0)
HCT: 36.9 % (ref 36.0–46.0)
Hemoglobin: 12.3 g/dL (ref 12.0–15.0)
Lymphocytes Relative: 49 % — ABNORMAL HIGH (ref 12.0–46.0)
Lymphs Abs: 3.1 10*3/uL (ref 0.7–4.0)
MCHC: 33.5 g/dL (ref 30.0–36.0)
MCV: 94.8 fl (ref 78.0–100.0)
Monocytes Absolute: 0.5 10*3/uL (ref 0.1–1.0)
Monocytes Relative: 7.6 % (ref 3.0–12.0)
Neutro Abs: 2.5 10*3/uL (ref 1.4–7.7)
Neutrophils Relative %: 40.4 % — ABNORMAL LOW (ref 43.0–77.0)
Platelets: 272 10*3/uL (ref 150.0–400.0)
RBC: 3.89 Mil/uL (ref 3.87–5.11)
RDW: 14 % (ref 11.5–15.5)
WBC: 6.3 10*3/uL (ref 4.0–10.5)

## 2019-03-21 LAB — LIPID PANEL
Cholesterol: 225 mg/dL — ABNORMAL HIGH (ref 0–200)
HDL: 43.7 mg/dL (ref >39.00)
LDL Cholesterol: 165 mg/dL — ABNORMAL HIGH (ref 0–99)
NonHDL: 181.48
Total CHOL/HDL Ratio: 5
Triglycerides: 81 mg/dL (ref 0.0–149.0)
VLDL: 16.2 mg/dL (ref 0.0–40.0)

## 2019-03-21 LAB — TSH: TSH: 1.03 u[IU]/mL (ref 0.35–4.50)

## 2019-03-21 LAB — VITAMIN D 25 HYDROXY (VIT D DEFICIENCY, FRACTURES): VITD: 25.53 ng/mL — ABNORMAL LOW (ref 30.00–100.00)

## 2019-03-21 NOTE — Patient Instructions (Signed)

## 2019-03-21 NOTE — Progress Notes (Signed)
Subjective:      Tiffany Gallagher is a 53 y.o. female and is here for a comprehensive physical exam. The patient reports no problems.  Social History   Socioeconomic History  . Marital status: Divorced    Spouse name: Not on file  . Number of children: 1  . Years of education: 76  . Highest education level: Not on file  Occupational History  . Occupation: Therapist, art   Social Needs  . Financial resource strain: Not on file  . Food insecurity    Worry: Not on file    Inability: Not on file  . Transportation needs    Medical: Not on file    Non-medical: Not on file  Tobacco Use  . Smoking status: Current Every Day Smoker    Packs/day: 0.50    Years: 30.00    Pack years: 15.00    Types: Cigarettes  . Smokeless tobacco: Never Used  . Tobacco comment: cutting back  Substance and Sexual Activity  . Alcohol use: Yes    Alcohol/week: 0.0 standard drinks    Comment: rarely; once yearly  . Drug use: No  . Sexual activity: Yes    Partners: Male    Birth control/protection: Surgical, Condom  Lifestyle  . Physical activity    Days per week: Not on file    Minutes per session: Not on file  . Stress: Not on file  Relationships  . Social Herbalist on phone: Not on file    Gets together: Not on file    Attends religious service: Not on file    Active member of club or organization: Not on file    Attends meetings of clubs or organizations: Not on file    Relationship status: Not on file  . Intimate partner violence    Fear of current or ex partner: Not on file    Emotionally abused: Not on file    Physically abused: Not on file    Forced sexual activity: Not on file  Other Topics Concern  . Not on file  Social History Narrative   Exercise-- no   Health Maintenance  Topic Date Due  . INFLUENZA VACCINE  07/20/2019 (Originally 11/20/2018)  . MAMMOGRAM  08/30/2019  . COLONOSCOPY  05/19/2026  . TETANUS/TDAP  03/15/2028  . HIV Screening  Completed    The  following portions of the patient's history were reviewed and updated as appropriate: She  has a past medical history of Allergy, Diverticulitis, Fibroid, Hyperlipidemia, Hypertension, Migraines, Obesity, morbid, BMI 40.0-49.9 ([place]), and Sleep apnea. She does not have any pertinent problems on file. She  has a past surgical history that includes Cesarean section (1995); Vaginal hysterectomy (1998); and bone spur. Her family history includes Aneurysm in her mother; Arthritis in her brother; Cancer (age of onset: 11) in her father; Coronary artery disease in her mother; Diabetes in her brother and sister; Heart disease in her mother; Hyperlipidemia in her sister; Hypertension in her brother, brother, mother, sister, and sister; Kidney disease (age of onset: 73) in her mother; Stroke in her mother. She  reports that she has been smoking cigarettes. She has a 15.00 pack-year smoking history. She has never used smokeless tobacco. She reports current alcohol use. She reports that she does not use drugs. She has a current medication list which includes the following prescription(s): acetaminophen, acyclovir ointment, albuterol, vitamin c, benazepril, calcium, fluticasone, hydrochlorothiazide, multivitamin with minerals, omeprazole, orlistat, thiamine, tramadol, vitamin d, and crestor, and  the following Facility-Administered Medications: sodium chloride. Current Outpatient Medications on File Prior to Visit  Medication Sig Dispense Refill  . acetaminophen (TYLENOL) 325 MG tablet Take 650 mg by mouth every 6 (six) hours as needed.    Marland Kitchen acyclovir ointment (ZOVIRAX) 5 % Apply tid prn 30 g 2  . albuterol (PROVENTIL HFA;VENTOLIN HFA) 108 (90 Base) MCG/ACT inhaler Inhale 2 puffs into the lungs every 6 (six) hours as needed for wheezing or shortness of breath. 1 Inhaler 0  . Ascorbic Acid (VITAMIN C) 100 MG tablet Take 100 mg by mouth daily.    . benazepril (LOTENSIN) 10 MG tablet Take 1 tablet (10 mg total) by mouth  daily. 90 tablet 1  . CALCIUM PO Take 1 tablet by mouth daily.    . fluticasone (FLONASE) 50 MCG/ACT nasal spray Place 2 sprays into both nostrils daily. 16 g 6  . hydrochlorothiazide (HYDRODIURIL) 25 MG tablet Take 1 tablet (25 mg total) by mouth daily. 90 tablet 1  . Multiple Vitamin (MULTIVITAMIN WITH MINERALS) TABS Take 1 tablet by mouth daily.    Marland Kitchen omeprazole (PRILOSEC) 20 MG capsule Take 1 capsule (20 mg total) by mouth daily. 90 capsule 3  . Orlistat (ALLI PO) Take 1 capsule by mouth daily.    Marland Kitchen thiamine (VITAMIN B-1) 100 MG tablet Take 100 mg by mouth daily.    . traMADol (ULTRAM) 50 MG tablet Take 50 mg by mouth every 6 (six) hours as needed.    Marland Kitchen VITAMIN D PO Take 4,000 Units by mouth daily.    . CRESTOR 10 MG tablet as directed.     Current Facility-Administered Medications on File Prior to Visit  Medication Dose Route Frequency Provider Last Rate Last Dose  . 0.9 %  sodium chloride infusion  500 mL Intravenous Continuous Milus Banister, MD       She is allergic to lactose intolerance (gi) and lipitor [atorvastatin calcium]..  Review of Systems Review of Systems  Constitutional: Negative for activity change, appetite change and fatigue.  HENT: Negative for hearing loss, congestion, tinnitus and ear discharge.  dentist q76m Eyes: Negative for visual disturbance (see optho q1y -- vision corrected to 20/20 with glasses).  Respiratory: Negative for cough, chest tightness and shortness of breath.   Cardiovascular: Negative for chest pain, palpitations and leg swelling.  Gastrointestinal: Negative for abdominal pain, diarrhea, constipation and abdominal distention.  Genitourinary: Negative for urgency, frequency, decreased urine volume and difficulty urinating.  Musculoskeletal: Negative for back pain, arthralgias and gait problem.  Skin: Negative for color change, pallor and rash.  Neurological: Negative for dizziness, light-headedness, numbness and headaches.  Hematological:  Negative for adenopathy. Does not bruise/bleed easily.  Psychiatric/Behavioral: Negative for suicidal ideas, confusion, sleep disturbance, self-injury, dysphoric mood, decreased concentration and agitation.       Objective:    BP 126/64 (BP Location: Left Arm, Cuff Size: Large)   Pulse 67   Temp (!) 96.9 F (36.1 C) (Temporal)   Resp 12   Ht 5\' 5"  (1.651 m)   Wt 257 lb 3.2 oz (116.7 kg)   SpO2 100%   BMI 42.80 kg/m  General appearance: alert, cooperative, appears stated age and no distress Head: Normocephalic, without obvious abnormality, atraumatic Eyes: negative findings: lids and lashes normal, conjunctivae and sclerae normal and pupils equal, round, reactive to light and accomodation Ears: normal TM's and external ear canals both ears Nose: Nares normal. Septum midline. Mucosa normal. No drainage or sinus tenderness. Throat: lips, mucosa, and tongue  normal; teeth and gums normal Neck: no adenopathy, no carotid bruit, no JVD, supple, symmetrical, trachea midline and thyroid not enlarged, symmetric, no tenderness/mass/nodules Back: symmetric, no curvature. ROM normal. No CVA tenderness. Lungs: clear to auscultation bilaterally Breasts: gyn Heart: regular rate and rhythm, S1, S2 normal, no murmur, click, rub or gallop Abdomen: soft, non-tender; bowel sounds normal; no masses,  no organomegaly Pelvic: deferred-gyn Extremities: extremities normal, atraumatic, no cyanosis or edema Pulses: 2+ and symmetric Skin: Skin color, texture, turgor normal. No rashes or lesions Lymph nodes: Cervical, supraclavicular, and axillary nodes normal. Neurologic: Alert and oriented X 3, normal strength and tone. Normal symmetric reflexes. Normal coordination and gait    Assessment:    Healthy female exam.      Plan:    ghm utd Check labs Pt refused flu shot See After Visit Summary for Counseling Recommendations    1. Vitamin D deficiency Check labs  con't supp for now  - Vitamin D (25  hydroxy)  2. Preventative health care See above  - Lipid panel - CBC with Differential - Comprehensive metabolic panel - TSH  3. Hyperlipidemia, unspecified hyperlipidemia type Encouraged heart healthy diet, increase exercise, avoid trans fats, consider a krill oil cap daily - Lipid panel  4. Essential hypertension Well controlled, no changes to meds. Encouraged heart healthy diet such as the DASH diet and exercise as tolerated.  - CBC with Differential

## 2019-03-22 ENCOUNTER — Other Ambulatory Visit: Payer: Self-pay

## 2019-03-22 ENCOUNTER — Other Ambulatory Visit: Payer: Self-pay | Admitting: Family Medicine

## 2019-03-22 DIAGNOSIS — E559 Vitamin D deficiency, unspecified: Secondary | ICD-10-CM

## 2019-03-22 DIAGNOSIS — E785 Hyperlipidemia, unspecified: Secondary | ICD-10-CM

## 2019-03-22 MED ORDER — VITAMIN D (ERGOCALCIFEROL) 1.25 MG (50000 UNIT) PO CAPS: 50000.0000 [IU] | ORAL_CAPSULE | ORAL | 0 refills | Status: DC

## 2019-03-22 MED ORDER — ROSUVASTATIN CALCIUM 10 MG PO TABS: 10.0000 mg | ORAL_TABLET | Freq: Every day | ORAL | 2 refills | Status: DC

## 2019-03-25 ENCOUNTER — Encounter: Payer: BLUE CROSS/BLUE SHIELD | Admitting: Family Medicine

## 2019-03-30 DIAGNOSIS — G4733 Obstructive sleep apnea (adult) (pediatric): Secondary | ICD-10-CM | POA: Diagnosis not present

## 2019-03-31 ENCOUNTER — Encounter: Payer: Self-pay | Admitting: Family Medicine

## 2019-03-31 ENCOUNTER — Other Ambulatory Visit: Payer: Self-pay | Admitting: Family Medicine

## 2019-03-31 DIAGNOSIS — I10 Essential (primary) hypertension: Secondary | ICD-10-CM

## 2019-05-31 ENCOUNTER — Other Ambulatory Visit: Payer: Self-pay | Admitting: Family Medicine

## 2019-05-31 DIAGNOSIS — Z1231 Encounter for screening mammogram for malignant neoplasm of breast: Secondary | ICD-10-CM

## 2019-07-04 ENCOUNTER — Ambulatory Visit (INDEPENDENT_AMBULATORY_CARE_PROVIDER_SITE_OTHER): Payer: BC Managed Care – PPO | Admitting: Women's Health

## 2019-07-04 ENCOUNTER — Other Ambulatory Visit: Payer: Self-pay

## 2019-07-04 ENCOUNTER — Encounter: Payer: Self-pay | Admitting: Women's Health

## 2019-07-04 VITALS — BP 120/82 | Ht 65.0 in | Wt 261.0 lb

## 2019-07-04 DIAGNOSIS — Z113 Encounter for screening for infections with a predominantly sexual mode of transmission: Secondary | ICD-10-CM | POA: Diagnosis not present

## 2019-07-04 DIAGNOSIS — N898 Other specified noninflammatory disorders of vagina: Secondary | ICD-10-CM | POA: Diagnosis not present

## 2019-07-04 DIAGNOSIS — Z01419 Encounter for gynecological examination (general) (routine) without abnormal findings: Secondary | ICD-10-CM | POA: Diagnosis not present

## 2019-07-04 LAB — WET PREP FOR TRICH, YEAST, CLUE

## 2019-07-04 MED ORDER — METRONIDAZOLE 500 MG PO TABS: 500.0000 mg | ORAL_TABLET | Freq: Two times a day (BID) | ORAL | 0 refills | Status: DC
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
## 2019-07-04 NOTE — Patient Instructions (Signed)
Boric acid on amazon  600mg  gel cap put in VAGINA twice a week to help prevent Vit D 2000 iu daily Health Maintenance for Postmenopausal Women Menopause is a normal process in which your ability to get pregnant comes to an end. This process happens slowly over many months or years, usually between the ages of 91 and 21. Menopause is complete when you have missed your menstrual periods for 12 months. It is important to talk with your health care provider about some of the most common conditions that affect women after menopause (postmenopausal women). These include heart disease, cancer, and bone loss (osteoporosis). Adopting a healthy lifestyle and getting preventive care can help to promote your health and wellness. The actions you take can also lower your chances of developing some of these common conditions. What should I know about menopause? During menopause, you may get a number of symptoms, such as:  Hot flashes. These can be moderate or severe.  Night sweats.  Decrease in sex drive.  Mood swings.  Headaches.  Tiredness.  Irritability.  Memory problems.  Insomnia. Choosing to treat or not to treat these symptoms is a decision that you make with your health care provider. Do I need hormone replacement therapy?  Hormone replacement therapy is effective in treating symptoms that are caused by menopause, such as hot flashes and night sweats.  Hormone replacement carries certain risks, especially as you become older. If you are thinking about using estrogen or estrogen with progestin, discuss the benefits and risks with your health care provider. What is my risk for heart disease and stroke? The risk of heart disease, heart attack, and stroke increases as you age. One of the causes may be a change in the body's hormones during menopause. This can affect how your body uses dietary fats, triglycerides, and cholesterol. Heart attack and stroke are medical emergencies. There are many  things that you can do to help prevent heart disease and stroke. Watch your blood pressure  High blood pressure causes heart disease and increases the risk of stroke. This is more likely to develop in people who have high blood pressure readings, are of African descent, or are overweight.  Have your blood pressure checked: ? Every 3-5 years if you are 99-4 years of age. ? Every year if you are 62 years old or older. Eat a healthy diet   Eat a diet that includes plenty of vegetables, fruits, low-fat dairy products, and lean protein.  Do not eat a lot of foods that are high in solid fats, added sugars, or sodium. Get regular exercise Get regular exercise. This is one of the most important things you can do for your health. Most adults should:  Try to exercise for at least 150 minutes each week. The exercise should increase your heart rate and make you sweat (moderate-intensity exercise).  Try to do strengthening exercises at least twice each week. Do these in addition to the moderate-intensity exercise.  Spend less time sitting. Even light physical activity can be beneficial. Other tips  Work with your health care provider to achieve or maintain a healthy weight.  Do not use any products that contain nicotine or tobacco, such as cigarettes, e-cigarettes, and chewing tobacco. If you need help quitting, ask your health care provider.  Know your numbers. Ask your health care provider to check your cholesterol and your blood sugar (glucose). Continue to have your blood tested as directed by your health care provider. Do I need screening for cancer?  Depending on your health history and family history, you may need to have cancer screening at different stages of your life. This may include screening for:  Breast cancer.  Cervical cancer.  Lung cancer.  Colorectal cancer. What is my risk for osteoporosis? After menopause, you may be at increased risk for osteoporosis. Osteoporosis is  a condition in which bone destruction happens more quickly than new bone creation. To help prevent osteoporosis or the bone fractures that can happen because of osteoporosis, you may take the following actions:  If you are 90-15 years old, get at least 1,000 mg of calcium and at least 600 mg of vitamin D per day.  If you are older than age 68 but younger than age 40, get at least 1,200 mg of calcium and at least 600 mg of vitamin D per day.  If you are older than age 20, get at least 1,200 mg of calcium and at least 800 mg of vitamin D per day. Smoking and drinking excessive alcohol increase the risk of osteoporosis. Eat foods that are rich in calcium and vitamin D, and do weight-bearing exercises several times each week as directed by your health care provider. How does menopause affect my mental health? Depression may occur at any age, but it is more common as you become older. Common symptoms of depression include:  Low or sad mood.  Changes in sleep patterns.  Changes in appetite or eating patterns.  Feeling an overall lack of motivation or enjoyment of activities that you previously enjoyed.  Frequent crying spells. Talk with your health care provider if you think that you are experiencing depression. General instructions See your health care provider for regular wellness exams and vaccines. This may include:  Scheduling regular health, dental, and eye exams.  Getting and maintaining your vaccines. These include: ? Influenza vaccine. Get this vaccine each year before the flu season begins. ? Pneumonia vaccine. ? Shingles vaccine. ? Tetanus, diphtheria, and pertussis (Tdap) booster vaccine. Your health care provider may also recommend other immunizations. Tell your health care provider if you have ever been abused or do not feel safe at home. Summary  Menopause is a normal process in which your ability to get pregnant comes to an end.  This condition causes hot flashes, night  sweats, decreased interest in sex, mood swings, headaches, or lack of sleep.  Treatment for this condition may include hormone replacement therapy.  Take actions to keep yourself healthy, including exercising regularly, eating a healthy diet, watching your weight, and checking your blood pressure and blood sugar levels.  Get screened for cancer and depression. Make sure that you are up to date with all your vaccines. This information is not intended to replace advice given to you by your health care provider. Make sure you discuss any questions you have with your health care provider. Document Revised: 03/31/2018 Document Reviewed: 03/31/2018 Elsevier Patient Education  2020 [employer].

## 2019-07-22 DIAGNOSIS — L853 Xerosis cutis: Secondary | ICD-10-CM | POA: Diagnosis not present

## 2019-07-22 DIAGNOSIS — L859 Epidermal thickening, unspecified: Secondary | ICD-10-CM | POA: Diagnosis not present

## 2019-07-22 DIAGNOSIS — L650 Telogen effluvium: Secondary | ICD-10-CM | POA: Diagnosis not present

## 2019-07-22 DIAGNOSIS — L304 Erythema intertrigo: Secondary | ICD-10-CM | POA: Diagnosis not present

## 2019-08-31 ENCOUNTER — Other Ambulatory Visit: Payer: Self-pay

## 2019-08-31 ENCOUNTER — Ambulatory Visit
Admission: RE | Admit: 2019-08-31 | Discharge: 2019-08-31 | Disposition: A | Discharge status: Home / Self Care | Payer: BC Managed Care – PPO | Source: Ambulatory Visit | Attending: Family Medicine | Admitting: Family Medicine

## 2019-08-31 DIAGNOSIS — Z1231 Encounter for screening mammogram for malignant neoplasm of breast: Secondary | ICD-10-CM

## 2019-09-01 ENCOUNTER — Other Ambulatory Visit: Payer: Self-pay | Admitting: Family Medicine

## 2019-09-01 DIAGNOSIS — N6489 Other specified disorders of breast: Secondary | ICD-10-CM

## 2019-09-01 DIAGNOSIS — R928 Other abnormal and inconclusive findings on diagnostic imaging of breast: Secondary | ICD-10-CM

## 2019-09-07 ENCOUNTER — Encounter: Payer: Self-pay | Admitting: Family Medicine

## 2019-09-07 DIAGNOSIS — I10 Essential (primary) hypertension: Secondary | ICD-10-CM

## 2019-09-07 MED ORDER — BENAZEPRIL HCL 10 MG PO TABS: 10.0000 mg | ORAL_TABLET | Freq: Every day | ORAL | 0 refills | Status: DC

## 2019-09-09 ENCOUNTER — Other Ambulatory Visit: Payer: Self-pay

## 2019-09-09 ENCOUNTER — Ambulatory Visit
Admission: RE | Admit: 2019-09-09 | Discharge: 2019-09-09 | Disposition: A | Discharge status: Home / Self Care | Payer: BC Managed Care – PPO | Source: Ambulatory Visit | Attending: Family Medicine | Admitting: Family Medicine

## 2019-09-09 DIAGNOSIS — N6489 Other specified disorders of breast: Secondary | ICD-10-CM

## 2019-09-09 DIAGNOSIS — N6002 Solitary cyst of left breast: Secondary | ICD-10-CM | POA: Diagnosis not present

## 2019-09-09 DIAGNOSIS — R922 Inconclusive mammogram: Secondary | ICD-10-CM | POA: Diagnosis not present

## 2019-09-09 DIAGNOSIS — R928 Other abnormal and inconclusive findings on diagnostic imaging of breast: Secondary | ICD-10-CM

## 2019-09-13 ENCOUNTER — Telehealth: Payer: Self-pay | Admitting: Family Medicine

## 2019-09-13 NOTE — Telephone Encounter (Signed)
Caller: Tali  Call back phone number: [phone]  Patient states she has an appointment on 09/16/19 with Dr.Lowne she likes to know if she needs to fast.  Please advise.

## 2019-09-14 NOTE — Telephone Encounter (Signed)
Spoke with patient. Advised to fast

## 2019-09-16 ENCOUNTER — Other Ambulatory Visit: Payer: Self-pay

## 2019-09-16 ENCOUNTER — Ambulatory Visit (HOSPITAL_BASED_OUTPATIENT_CLINIC_OR_DEPARTMENT_OTHER)
Admission: RE | Admit: 2019-09-16 | Discharge: 2019-09-16 | Disposition: A | Discharge status: Home / Self Care | Payer: BC Managed Care – PPO | Source: Ambulatory Visit | Attending: Family Medicine | Admitting: Family Medicine

## 2019-09-16 ENCOUNTER — Ambulatory Visit: Payer: BC Managed Care – PPO | Admitting: Family Medicine

## 2019-09-16 ENCOUNTER — Encounter: Payer: Self-pay | Admitting: Family Medicine

## 2019-09-16 VITALS — BP 110/80 | HR 73 | Temp 97.9°F | Resp 18 | Ht 65.0 in | Wt 263.2 lb

## 2019-09-16 DIAGNOSIS — M25551 Pain in right hip: Secondary | ICD-10-CM | POA: Diagnosis not present

## 2019-09-16 DIAGNOSIS — B009 Herpesviral infection, unspecified: Secondary | ICD-10-CM | POA: Diagnosis not present

## 2019-09-16 DIAGNOSIS — L659 Nonscarring hair loss, unspecified: Secondary | ICD-10-CM

## 2019-09-16 DIAGNOSIS — E785 Hyperlipidemia, unspecified: Secondary | ICD-10-CM | POA: Diagnosis not present

## 2019-09-16 DIAGNOSIS — I10 Essential (primary) hypertension: Secondary | ICD-10-CM | POA: Diagnosis not present

## 2019-09-16 DIAGNOSIS — R1013 Epigastric pain: Secondary | ICD-10-CM

## 2019-09-16 DIAGNOSIS — J4520 Mild intermittent asthma, uncomplicated: Secondary | ICD-10-CM

## 2019-09-16 LAB — LIPID PANEL
Cholesterol: 230 mg/dL — ABNORMAL HIGH (ref 0–200)
HDL: 49.8 mg/dL (ref >39.00)
LDL Cholesterol: 154 mg/dL — ABNORMAL HIGH (ref 0–99)
NonHDL: 179.83
Total CHOL/HDL Ratio: 5
Triglycerides: 130 mg/dL (ref 0.0–149.0)
VLDL: 26 mg/dL (ref 0.0–40.0)

## 2019-09-16 LAB — COMPREHENSIVE METABOLIC PANEL
ALT: 9 U/L (ref 0–35)
AST: 12 U/L (ref 0–37)
Albumin: 4.2 g/dL (ref 3.5–5.2)
Alkaline Phosphatase: 69 U/L (ref 39–117)
BUN: 20 mg/dL (ref 6–23)
CO2: 29 mEq/L (ref 19–32)
Calcium: 9.6 mg/dL (ref 8.4–10.5)
Chloride: 102 mEq/L (ref 96–112)
Creatinine, Ser: 0.83 mg/dL (ref 0.40–1.20)
GFR: 86.76 mL/min (ref >60.00)
Glucose, Bld: 111 mg/dL — ABNORMAL HIGH (ref 70–99)
Potassium: 3.7 mEq/L (ref 3.5–5.1)
Sodium: 139 mEq/L (ref 135–145)
Total Bilirubin: 0.4 mg/dL (ref 0.2–1.2)
Total Protein: 7.3 g/dL (ref 6.0–8.3)

## 2019-09-16 LAB — B12 AND FOLATE PANEL
Folate: 6 ng/mL (ref >5.9)
Vitamin B-12: 174 pg/mL — ABNORMAL LOW (ref 211–911)

## 2019-09-16 MED ORDER — ALBUTEROL SULFATE HFA 108 (90 BASE) MCG/ACT IN AERS: 2.0000 | INHALATION_SPRAY | Freq: Four times a day (QID) | RESPIRATORY_TRACT | 2 refills | Status: DC | PRN

## 2019-09-16 MED ORDER — HYDROCHLOROTHIAZIDE 25 MG PO TABS: 25.0000 mg | ORAL_TABLET | Freq: Every day | ORAL | 3 refills | Status: DC

## 2019-09-16 MED ORDER — OMEPRAZOLE 20 MG PO CPDR: 20.0000 mg | DELAYED_RELEASE_CAPSULE | Freq: Every day | ORAL | 3 refills | Status: DC

## 2019-09-16 MED ORDER — ACYCLOVIR 5 % EX OINT: TOPICAL_OINTMENT | CUTANEOUS | 2 refills | Status: DC

## 2019-09-16 MED ORDER — BENAZEPRIL HCL 10 MG PO TABS: 10.0000 mg | ORAL_TABLET | Freq: Every day | ORAL | 3 refills | Status: DC

## 2019-09-16 NOTE — Progress Notes (Signed)
Patient ID: Tiffany Gallagher, female    DOB: [birth year]  Age: 54 y.o. MRN: [id number]    Subjective:  Subjective  HPI Rheda Fernanda Lamm presents for f/u bp and chol She also c/o r hip pain that radiates to R knee x 2 months.     No known injury ---  Pain comes and goes   No known injury  She also c/o hair loss--  Hair is falling out in clumps   Review of Systems  Constitutional: Negative for appetite change, diaphoresis, fatigue and unexpected weight change.  Eyes: Negative for pain, redness and visual disturbance.  Respiratory: Negative for cough, chest tightness, shortness of breath and wheezing.   Cardiovascular: Negative for chest pain, palpitations and leg swelling.  Endocrine: Negative for cold intolerance, heat intolerance, polydipsia, polyphagia and polyuria.  Genitourinary: Negative for difficulty urinating, dysuria and frequency.  Musculoskeletal: Positive for arthralgias.  Neurological: Negative for dizziness, light-headedness, numbness and headaches.    History Past Medical History:  Diagnosis Date  . Allergy   . Diverticulitis   . Fibroid   . Hyperlipidemia   . Hypertension   . Migraines   . Obesity, morbid, BMI 40.0-49.9 ([place])   . Sleep apnea    cpap    She has a past surgical history that includes Cesarean section (1995); Vaginal hysterectomy (1998); and bone spur.   Her family history includes Aneurysm in her mother; Arthritis in her brother; Cancer (age of onset: 33) in her father; Coronary artery disease in her mother; Diabetes in her brother and sister; Heart disease in her mother; Hyperlipidemia in her sister; Hypertension in her brother, brother, mother, sister, and sister; Kidney disease (age of onset: 49) in her mother; Stroke in her mother.She reports that she has been smoking cigarettes. She has a 15.00 pack-year smoking history. She has never used smokeless tobacco. She reports current alcohol use. She reports that she does not use drugs.  Current  Outpatient Medications on File Prior to Visit  Medication Sig Dispense Refill  . acetaminophen (TYLENOL) 325 MG tablet Take 650 mg by mouth every 6 (six) hours as needed.    . Ascorbic Acid (VITAMIN C) 100 MG tablet Take 100 mg by mouth daily.    Marland Kitchen CALCIUM PO Take 1 tablet by mouth daily.    . fluticasone (FLONASE) 50 MCG/ACT nasal spray Place 2 sprays into both nostrils daily. 16 g 6  . Multiple Vitamin (MULTIVITAMIN WITH MINERALS) TABS Take 1 tablet by mouth daily.    Marland Kitchen omeprazole (PRILOSEC) 20 MG capsule Take 1 capsule (20 mg total) by mouth daily. 90 capsule 3  . Orlistat (ALLI PO) Take 1 capsule by mouth daily.    Marland Kitchen thiamine (VITAMIN B-1) 100 MG tablet Take 100 mg by mouth daily.    . traMADol (ULTRAM) 50 MG tablet Take 50 mg by mouth every 6 (six) hours as needed.    Marland Kitchen VITAMIN D PO Take 4,000 Units by mouth daily.    . Vitamin D, Ergocalciferol, (DRISDOL) 1.25 MG (50000 UT) CAPS capsule Take 1 capsule (50,000 Units total) by mouth every 7 (seven) days. 12 capsule 0  . metroNIDAZOLE (FLAGYL) 500 MG tablet Take 1 tablet (500 mg total) by mouth 2 (two) times daily. No alcohol (Patient not taking: Reported on 09/16/2019) 14 tablet 0   Current Facility-Administered Medications on File Prior to Visit  Medication Dose Route Frequency Provider Last Rate Last Admin  . 0.9 %  sodium chloride infusion  500 mL Intravenous Continuous  Milus Banister, MD         +  Objective  Physical Exam Vitals and nursing note reviewed.  Constitutional:      Appearance: She is well-developed.  HENT:     Head: Normocephalic and atraumatic.  Eyes:     Conjunctiva/sclera: Conjunctivae normal.  Neck:     Thyroid: No thyromegaly.     Vascular: No carotid bruit or JVD.  Cardiovascular:     Rate and Rhythm: Normal rate and regular rhythm.     Heart sounds: Normal heart sounds. No murmur.  Pulmonary:     Effort: Pulmonary effort is normal. No respiratory distress.     Breath sounds: Normal breath sounds. No  wheezing or rales.  Chest:     Chest wall: No tenderness.  Musculoskeletal:     Cervical back: Normal range of motion and neck supple.  Neurological:     Mental Status: She is alert and oriented to person, place, and time.    BP 110/80 (BP Location: Right Arm, Patient Position: Sitting, Cuff Size: Large)   Pulse 73   Temp 97.9 F (36.6 C) (Temporal)   Resp 18   Ht 5\' 5"  (1.651 m)   Wt 263 lb 3.2 oz (119.4 kg)   SpO2 97%   BMI 43.80 kg/m  Wt Readings from Last 3 Encounters:  09/16/19 263 lb 3.2 oz (119.4 kg)  07/04/19 261 lb (118.4 kg)  03/21/19 257 lb 3.2 oz (116.7 kg)     Lab Results  Component Value Date   WBC 6.3 03/21/2019   HGB 12.3 03/21/2019   HCT 36.9 03/21/2019   PLT 272.0 03/21/2019   GLUCOSE 111 (H) 09/16/2019   CHOL 230 (H) 09/16/2019   TRIG 130.0 09/16/2019   HDL 49.80 09/16/2019   LDLDIRECT 151.6 01/18/2013   LDLCALC 154 (H) 09/16/2019   ALT 9 09/16/2019   AST 12 09/16/2019   NA 139 09/16/2019   K 3.7 09/16/2019   CL 102 09/16/2019   CREATININE 0.83 09/16/2019   BUN 20 09/16/2019   CO2 29 09/16/2019   TSH 2.53 09/16/2019   HGBA1C 5.2 03/31/2012    US BREAST LTD UNI LEFT INC AXILLA  Result Date: 09/12/2019 CLINICAL DATA:  Screening recall for a left breast asymmetry. EXAM: DIGITAL DIAGNOSTIC LEFT MAMMOGRAM WITH CAD AND TOMO ULTRASOUND LEFT BREAST COMPARISON:  Previous exam(s). ACR Breast Density Category c: The breast tissue is heterogeneously dense, which may obscure small masses. FINDINGS: Spot compression tomosynthesis images through the superior anterior left breast demonstrates a persistent low-density oval circumscribed mass measuring approximately 9 mm. Mammographic images were processed with CAD. Ultrasound targeted to the left breast at 12 o'clock, 4 cm from the nipple demonstrates an anechoic oval circumscribed mass measuring 8 x 4 x 8 mm. IMPRESSION: The mass in the left breast corresponds with a benign cyst. RECOMMENDATION: Screening  mammogram in one year.(Code:SM-B-01Y) I have discussed the findings and recommendations with the patient. If applicable, a reminder letter will be sent to the patient regarding the next appointment. BI-RADS CATEGORY  2: Benign. Electronically Signed   By: Ammie Ferrier M.D.   On: 09/09/2019 11:46   MM DIAG BREAST TOMO UNI LEFT  Result Date: 09/09/2019 CLINICAL DATA:  Screening recall for a left breast asymmetry. EXAM: DIGITAL DIAGNOSTIC LEFT MAMMOGRAM WITH CAD AND TOMO ULTRASOUND LEFT BREAST COMPARISON:  Previous exam(s). ACR Breast Density Category c: The breast tissue is heterogeneously dense, which may obscure small masses. FINDINGS: Spot compression tomosynthesis images through  the superior anterior left breast demonstrates a persistent low-density oval circumscribed mass measuring approximately 9 mm. Mammographic images were processed with CAD. Ultrasound targeted to the left breast at 12 o'clock, 4 cm from the nipple demonstrates an anechoic oval circumscribed mass measuring 8 x 4 x 8 mm. IMPRESSION: The mass in the left breast corresponds with a benign cyst. RECOMMENDATION: Screening mammogram in one year.(Code:SM-B-01Y) I have discussed the findings and recommendations with the patient. If applicable, a reminder letter will be sent to the patient regarding the next appointment. BI-RADS CATEGORY  2: Benign. Electronically Signed   By: Ammie Ferrier M.D.   On: 09/09/2019 11:46     Assessment & Plan:  Plan  I have changed Jatavia K. Plotkin's hydrochlorothiazide and albuterol. I am also having her start on omeprazole. Additionally, I am having her maintain her multivitamin with minerals, vitamin C, thiamine, traMADol, acetaminophen, fluticasone, omeprazole, VITAMIN D PO, Orlistat (ALLI PO), CALCIUM PO, Vitamin D (Ergocalciferol), metroNIDAZOLE, benazepril, and acyclovir ointment. We will continue to administer sodium chloride.  Meds ordered this encounter  Medications  . benazepril  (LOTENSIN) 10 MG tablet    Sig: Take 1 tablet (10 mg total) by mouth daily.    Dispense:  90 tablet    Refill:  3  . hydrochlorothiazide (HYDRODIURIL) 25 MG tablet    Sig: Take 1 tablet (25 mg total) by mouth daily.    Dispense:  90 tablet    Refill:  3  . acyclovir ointment (ZOVIRAX) 5 %    Sig: Apply tid prn    Dispense:  30 g    Refill:  2  . albuterol (VENTOLIN HFA) 108 (90 Base) MCG/ACT inhaler    Sig: Inhale 2 puffs into the lungs every 6 (six) hours as needed for wheezing or shortness of breath.    Dispense:  18 g    Refill:  2  . omeprazole (PRILOSEC) 20 MG capsule    Sig: Take 1 capsule (20 mg total) by mouth daily.    Dispense:  30 capsule    Refill:  3    Problem List Items Addressed This Visit      Unprioritized   Dyspepsia   Relevant Medications   omeprazole (PRILOSEC) 20 MG capsule   Essential hypertension    Well controlled, no changes to meds. Encouraged heart healthy diet such as the DASH diet and exercise as tolerated.       Relevant Medications   benazepril (LOTENSIN) 10 MG tablet   hydrochlorothiazide (HYDRODIURIL) 25 MG tablet   Other Relevant Orders   Lipid panel (Completed)   Comprehensive metabolic panel (Completed)   Hair loss   Relevant Orders   B12 and Folate Panel (Completed)   Thyroid Panel With TSH (Completed)   Hyperlipidemia - Primary    Encouraged heart healthy diet, increase exercise, avoid trans fats, consider a krill oil cap daily      Relevant Medications   benazepril (LOTENSIN) 10 MG tablet   hydrochlorothiazide (HYDRODIURIL) 25 MG tablet   Other Relevant Orders   Lipid panel (Completed)   Comprehensive metabolic panel (Completed)   Mild intermittent asthma without complication    Stable Refill inhaler      Relevant Medications   albuterol (VENTOLIN HFA) 108 (90 Base) MCG/ACT inhaler   Right hip pain    Check xray Consider PT ,  Can use tylenol , ice heat , salon pas patches        Relevant Orders   DG HIP UNILAT  WITH PELVIS 2-3 VIEWS RIGHT (Completed)    Other Visit Diagnoses    HSV-2 infection       Relevant Medications   acyclovir ointment (ZOVIRAX) 5 %      Follow-up: Return in about 6 months (around 03/18/2020) for annual exam, fasting.  Ann Held, DO

## 2019-09-16 NOTE — Patient Instructions (Signed)
DASH Eating Plan DASH stands for "Dietary Approaches to Stop Hypertension." The DASH eating plan is a healthy eating plan that has been shown to reduce high blood pressure (hypertension). It may also reduce your risk for type 2 diabetes, heart disease, and stroke. The DASH eating plan may also help with weight loss. What are tips for following this plan?  General guidelines  Avoid eating more than 2,300 mg (milligrams) of salt (sodium) a day. If you have hypertension, you may need to reduce your sodium intake to 1,500 mg a day.  Limit alcohol intake to no more than 1 drink a day for nonpregnant women and 2 drinks a day for men. One drink equals 12 oz of beer, 5 oz of wine, or 1 oz of hard liquor.  Work with your health care provider to maintain a healthy body weight or to lose weight. Ask what an ideal weight is for you.  Get at least 30 minutes of exercise that causes your heart to beat faster (aerobic exercise) most days of the week. Activities may include walking, swimming, or biking.  Work with your health care provider or diet and nutrition specialist (dietitian) to adjust your eating plan to your individual calorie needs. Reading food labels   Check food labels for the amount of sodium per serving. Choose foods with less than 5 percent of the Daily Value of sodium. Generally, foods with less than 300 mg of sodium per serving fit into this eating plan.  To find whole grains, look for the word "whole" as the first word in the ingredient list. Shopping  Buy products labeled as "low-sodium" or "no salt added."  Buy fresh foods. Avoid canned foods and premade or frozen meals. Cooking  Avoid adding salt when cooking. Use salt-free seasonings or herbs instead of table salt or sea salt. Check with your health care provider or pharmacist before using salt substitutes.  Do not fry foods. Cook foods using healthy methods such as baking, boiling, grilling, and broiling instead.  Cook with  heart-healthy oils, such as olive, canola, soybean, or sunflower oil. Meal planning  Eat a balanced diet that includes: ? 5 or more servings of fruits and vegetables each day. At each meal, try to fill half of your plate with fruits and vegetables. ? Up to 6-8 servings of whole grains each day. ? Less than 6 oz of lean meat, poultry, or fish each day. A 3-oz serving of meat is about the same size as a deck of cards. One egg equals 1 oz. ? 2 servings of low-fat dairy each day. ? A serving of nuts, seeds, or beans 5 times each week. ? Heart-healthy fats. Healthy fats called Omega-3 fatty acids are found in foods such as flaxseeds and coldwater fish, like sardines, salmon, and mackerel.  Limit how much you eat of the following: ? Canned or prepackaged foods. ? Food that is high in trans fat, such as fried foods. ? Food that is high in saturated fat, such as fatty meat. ? Sweets, desserts, sugary drinks, and other foods with added sugar. ? Full-fat dairy products.  Do not salt foods before eating.  Try to eat at least 2 vegetarian meals each week.  Eat more home-cooked food and less restaurant, buffet, and fast food.  When eating at a restaurant, ask that your food be prepared with less salt or no salt, if possible. What foods are recommended? The items listed may not be a complete list. Talk with your dietitian about   what dietary choices are best for you. Grains Whole-grain or whole-wheat bread. Whole-grain or whole-wheat pasta. Brown rice. Oatmeal. Quinoa. Bulgur. Whole-grain and low-sodium cereals. Pita bread. Low-fat, low-sodium crackers. Whole-wheat flour tortillas. Vegetables Fresh or frozen vegetables (raw, steamed, roasted, or grilled). Low-sodium or reduced-sodium tomato and vegetable juice. Low-sodium or reduced-sodium tomato sauce and tomato paste. Low-sodium or reduced-sodium canned vegetables. Fruits All fresh, dried, or frozen fruit. Canned fruit in natural juice (without  added sugar). Meat and other protein foods Skinless chicken or turkey. Ground chicken or turkey. Pork with fat trimmed off. Fish and seafood. Egg whites. Dried beans, peas, or lentils. Unsalted nuts, nut butters, and seeds. Unsalted canned beans. Lean cuts of beef with fat trimmed off. Low-sodium, lean deli meat. Dairy Low-fat (1%) or fat-free (skim) milk. Fat-free, low-fat, or reduced-fat cheeses. Nonfat, low-sodium ricotta or cottage cheese. Low-fat or nonfat yogurt. Low-fat, low-sodium cheese. Fats and oils Soft margarine without trans fats. Vegetable oil. Low-fat, reduced-fat, or light mayonnaise and salad dressings (reduced-sodium). Canola, safflower, olive, soybean, and sunflower oils. Avocado. Seasoning and other foods Herbs. Spices. Seasoning mixes without salt. Unsalted popcorn and pretzels. Fat-free sweets. What foods are not recommended? The items listed may not be a complete list. Talk with your dietitian about what dietary choices are best for you. Grains Baked goods made with fat, such as croissants, muffins, or some breads. Dry pasta or rice meal packs. Vegetables Creamed or fried vegetables. Vegetables in a cheese sauce. Regular canned vegetables (not low-sodium or reduced-sodium). Regular canned tomato sauce and paste (not low-sodium or reduced-sodium). Regular tomato and vegetable juice (not low-sodium or reduced-sodium). Pickles. Olives. Fruits Canned fruit in a light or heavy syrup. Fried fruit. Fruit in cream or butter sauce. Meat and other protein foods Fatty cuts of meat. Ribs. Fried meat. Bacon. Sausage. Bologna and other processed lunch meats. Salami. Fatback. Hotdogs. Bratwurst. Salted nuts and seeds. Canned beans with added salt. Canned or smoked fish. Whole eggs or egg yolks. Chicken or turkey with skin. Dairy Whole or 2% milk, cream, and half-and-half. Whole or full-fat cream cheese. Whole-fat or sweetened yogurt. Full-fat cheese. Nondairy creamers. Whipped toppings.  Processed cheese and cheese spreads. Fats and oils Butter. Stick margarine. Lard. Shortening. Ghee. Bacon fat. Tropical oils, such as coconut, palm kernel, or palm oil. Seasoning and other foods Salted popcorn and pretzels. Onion salt, garlic salt, seasoned salt, table salt, and sea salt. Worcestershire sauce. Tartar sauce. Barbecue sauce. Teriyaki sauce. Soy sauce, including reduced-sodium. Steak sauce. Canned and packaged gravies. Fish sauce. Oyster sauce. Cocktail sauce. Horseradish that you find on the shelf. Ketchup. Mustard. Meat flavorings and tenderizers. Bouillon cubes. Hot sauce and Tabasco sauce. Premade or packaged marinades. Premade or packaged taco seasonings. Relishes. Regular salad dressings. Where to find more information:  National Heart, Lung, and Blood Institute: www.nhlbi.nih.gov  American Heart Association: www.heart.org Summary  The DASH eating plan is a healthy eating plan that has been shown to reduce high blood pressure (hypertension). It may also reduce your risk for type 2 diabetes, heart disease, and stroke.  With the DASH eating plan, you should limit salt (sodium) intake to 2,300 mg a day. If you have hypertension, you may need to reduce your sodium intake to 1,500 mg a day.  When on the DASH eating plan, aim to eat more fresh fruits and vegetables, whole grains, lean proteins, low-fat dairy, and heart-healthy fats.  Work with your health care provider or diet and nutrition specialist (dietitian) to adjust your eating plan to your   individual calorie needs. This information is not intended to replace advice given to you by your health care provider. Make sure you discuss any questions you have with your health care provider. Document Revised: 03/20/2017 Document Reviewed: 03/31/2016 Elsevier Patient Education  2020 [employer]  

## 2019-09-17 LAB — THYROID PANEL WITH TSH
Free Thyroxine Index: 2.6 (ref 1.4–3.8)
T3 Uptake: 28 % (ref 22–35)
T4, Total: 9.2 ug/dL (ref 5.1–11.9)
TSH: 2.53 mIU/L

## 2019-09-19 DIAGNOSIS — R1013 Epigastric pain: Secondary | ICD-10-CM | POA: Insufficient documentation

## 2019-09-19 DIAGNOSIS — J4520 Mild intermittent asthma, uncomplicated: Secondary | ICD-10-CM | POA: Insufficient documentation

## 2019-09-19 DIAGNOSIS — E785 Hyperlipidemia, unspecified: Secondary | ICD-10-CM | POA: Insufficient documentation

## 2019-09-19 DIAGNOSIS — M25551 Pain in right hip: Secondary | ICD-10-CM | POA: Insufficient documentation

## 2019-09-19 DIAGNOSIS — L659 Nonscarring hair loss, unspecified: Secondary | ICD-10-CM | POA: Insufficient documentation

## 2019-09-19 NOTE — Assessment & Plan Note (Signed)
Well controlled, no changes to meds. Encouraged heart healthy diet such as the DASH diet and exercise as tolerated.  °

## 2019-09-19 NOTE — Assessment & Plan Note (Signed)
Encouraged heart healthy diet, increase exercise, avoid trans fats, consider a krill oil cap daily 

## 2019-09-19 NOTE — Assessment & Plan Note (Signed)
Stable Refill inhaler 

## 2019-09-19 NOTE — Assessment & Plan Note (Addendum)
Check xray Consider PT ,  Can use tylenol , ice heat , salon pas patches

## 2019-11-09 ENCOUNTER — Other Ambulatory Visit: Payer: Self-pay | Admitting: Family Medicine

## 2019-11-09 DIAGNOSIS — R1013 Epigastric pain: Secondary | ICD-10-CM

## 2019-12-15 DIAGNOSIS — G4733 Obstructive sleep apnea (adult) (pediatric): Secondary | ICD-10-CM | POA: Diagnosis not present

## 2020-04-10 ENCOUNTER — Encounter: Payer: BC Managed Care – PPO | Admitting: Medical

## 2020-04-18 ENCOUNTER — Other Ambulatory Visit: Payer: Self-pay

## 2020-04-18 ENCOUNTER — Ambulatory Visit (INDEPENDENT_AMBULATORY_CARE_PROVIDER_SITE_OTHER): Payer: BC Managed Care – PPO | Admitting: Medical

## 2020-04-18 VITALS — BP 137/83 | HR 66 | Resp 16 | Ht 65.0 in | Wt 260.0 lb

## 2020-04-18 DIAGNOSIS — Z0000 Encounter for general adult medical examination without abnormal findings: Secondary | ICD-10-CM

## 2020-04-18 DIAGNOSIS — Z7185 Encounter for immunization safety counseling: Secondary | ICD-10-CM | POA: Diagnosis not present

## 2020-04-18 DIAGNOSIS — B009 Herpesviral infection, unspecified: Secondary | ICD-10-CM | POA: Diagnosis not present

## 2020-04-18 DIAGNOSIS — M25559 Pain in unspecified hip: Secondary | ICD-10-CM | POA: Diagnosis not present

## 2020-04-18 NOTE — Patient Instructions (Addendum)
For you wellness exam today I have ordered cbc, cmp and  lipid panel.  Counsel on covid vacccine.   Recommend exercise and healthy diet.  We will let you know lab results as they come in.  Follow up date appointment will be determined after lab review.   For rt hip pain referred to sports med MD. For hsv hx disussed how would potentially proceed in event of hsv flare in you got flare after covid vaccine.   Preventive Care 9-54 Years Old, Female Preventive care refers to visits with your health care provider and lifestyle choices that can promote health and wellness. This includes:  A yearly physical exam. This may also be called an annual well check.  Regular dental visits and eye exams.  Immunizations.  Screening for certain conditions.  Healthy lifestyle choices, such as eating a healthy diet, getting regular exercise, not using drugs or products that contain nicotine and tobacco, and limiting alcohol use. What can I expect for my preventive care visit? Physical exam Your health care provider will check your:  Height and weight. This may be used to calculate body mass index (BMI), which tells if you are at a healthy weight.  Heart rate and blood pressure.  Skin for abnormal spots. Counseling Your health care provider may ask you questions about your:  Alcohol, tobacco, and drug use.  Emotional well-being.  Home and relationship well-being.  Sexual activity.  Eating habits.  Work and work Statistician.  Method of birth control.  Menstrual cycle.  Pregnancy history. What immunizations do I need?  Influenza (flu) vaccine  This is recommended every year. Tetanus, diphtheria, and pertussis (Tdap) vaccine  You may need a Td booster every 10 years. Varicella (chickenpox) vaccine  You may need this if you have not been vaccinated. Zoster (shingles) vaccine  You may need this after age 44. Measles, mumps, and rubella (MMR) vaccine  You may need at least  one dose of MMR if you were born in 1957 or later. You may also need a second dose. Pneumococcal conjugate (PCV13) vaccine  You may need this if you have certain conditions and were not previously vaccinated. Pneumococcal polysaccharide (PPSV23) vaccine  You may need one or two doses if you smoke cigarettes or if you have certain conditions. Meningococcal conjugate (MenACWY) vaccine  You may need this if you have certain conditions. Hepatitis A vaccine  You may need this if you have certain conditions or if you travel or work in places where you may be exposed to hepatitis A. Hepatitis B vaccine  You may need this if you have certain conditions or if you travel or work in places where you may be exposed to hepatitis B. Haemophilus influenzae type b (Hib) vaccine  You may need this if you have certain conditions. Human papillomavirus (HPV) vaccine  If recommended by your health care provider, you may need three doses over 6 months. You may receive vaccines as individual doses or as more than one vaccine together in one shot (combination vaccines). Talk with your health care provider about the risks and benefits of combination vaccines. What tests do I need? Blood tests  Lipid and cholesterol levels. These may be checked every 5 years, or more frequently if you are over 61 years old.  Hepatitis C test.  Hepatitis B test. Screening  Lung cancer screening. You may have this screening every year starting at age 30 if you have a 30-pack-year history of smoking and currently smoke or have quit  within the past 15 years.  Colorectal cancer screening. All adults should have this screening starting at age 59 and continuing until age 46. Your health care provider may recommend screening at age 12 if you are at increased risk. You will have tests every 1-10 years, depending on your results and the type of screening test.  Diabetes screening. This is done by checking your blood sugar (glucose)  after you have not eaten for a while (fasting). You may have this done every 1-3 years.  Mammogram. This may be done every 1-2 years. Talk with your health care provider about when you should start having regular mammograms. This may depend on whether you have a family history of breast cancer.  BRCA-related cancer screening. This may be done if you have a family history of breast, ovarian, tubal, or peritoneal cancers.  Pelvic exam and Pap test. This may be done every 3 years starting at age 65. Starting at age 68, this may be done every 5 years if you have a Pap test in combination with an HPV test. Other tests  Sexually transmitted disease (STD) testing.  Bone density scan. This is done to screen for osteoporosis. You may have this scan if you are at high risk for osteoporosis. Follow these instructions at home: Eating and drinking  Eat a diet that includes fresh fruits and vegetables, whole grains, lean protein, and low-fat dairy.  Take vitamin and mineral supplements as recommended by your health care provider.  Do not drink alcohol if: ? Your health care provider tells you not to drink. ? You are pregnant, may be pregnant, or are planning to become pregnant.  If you drink alcohol: ? Limit how much you have to 0-1 drink a day. ? Be aware of how much alcohol is in your drink. In the [place] one drink equals one 12 oz bottle of beer (355 mL), one 5 oz glass of wine (148 mL), or one 1 oz glass of hard liquor (44 mL). Lifestyle  Take daily care of your teeth and gums.  Stay active. Exercise for at least 30 minutes on 5 or more days each week.  Do not use any products that contain nicotine or tobacco, such as cigarettes, e-cigarettes, and chewing tobacco. If you need help quitting, ask your health care provider.  If you are sexually active, practice safe sex. Use a condom or other form of birth control (contraception) in order to prevent pregnancy and STIs (sexually transmitted  infections).  If told by your health care provider, take low-dose aspirin daily starting at age 77. What's next?  Visit your health care provider once a year for a well check visit.  Ask your health care provider how often you should have your eyes and teeth checked.  Stay up to date on all vaccines. This information is not intended to replace advice given to you by your health care provider. Make sure you discuss any questions you have with your health care provider. Document Revised: 12/17/2017 Document Reviewed: 12/17/2017 Elsevier Patient Education  2020 [employer].

## 2020-04-18 NOTE — Progress Notes (Signed)
Subjective:    Patient ID: Tiffany Gallagher, female    DOB: Aug 25, 1965, 54 y.o.   MRN: [id number]  HPI  Pt in for cpe/wellness exam.   Pt work customer service, pt exercises 3 days a week, Pt states moderate healthy, smokes 1/2-1 pack a day, social alcohol use.     Review of Systems  Constitutional: Negative for chills, fatigue and fever.  HENT: Negative for congestion and dental problem.   Respiratory: Negative for cough, choking, shortness of breath and wheezing.   Cardiovascular: Negative for chest pain and palpitations.  Gastrointestinal: Negative for abdominal pain.  Genitourinary: Negative for dysuria.  Musculoskeletal: Negative for back pain.       Rt side back and hip pain for about one year. Pt states xray have been negative. Dr. Etter Sjogren has evaluated.  Rt hip feel like stiffens up sometimes.  Skin: Negative for rash.  Neurological: Negative for dizziness, syncope, weakness, numbness and headaches.  Hematological: Negative for adenopathy. Does not bruise/bleed easily.  Psychiatric/Behavioral: Negative for behavioral problems and confusion.   Past Medical History:  Diagnosis Date  . Allergy   . Diverticulitis   . Fibroid   . Hyperlipidemia   . Hypertension   . Migraines   . Obesity, morbid, BMI 40.0-49.9 ([place])   . Sleep apnea    cpap     Social History   Socioeconomic History  . Marital status: Divorced    Spouse name: Not on file  . Number of children: 1  . Years of education: 59  . Highest education level: Not on file  Occupational History  . Occupation: customer service   Tobacco Use  . Smoking status: Current Every Day Smoker    Packs/day: 0.50    Years: 30.00    Pack years: 15.00    Types: Cigarettes  . Smokeless tobacco: Never Used  . Tobacco comment: cutting back  Vaping Use  . Vaping Use: Never used  Substance and Sexual Activity  . Alcohol use: Yes    Alcohol/week: 0.0 standard drinks    Comment: rarely; once yearly  . Drug use: No   . Sexual activity: Yes    Partners: Male    Birth control/protection: Surgical, Condom  Other Topics Concern  . Not on file  Social History Narrative   Exercise-- no   Social Determinants of Health   Financial Resource Strain: Not on file  Food Insecurity: Not on file  Transportation Needs: Not on file  Physical Activity: Not on file  Stress: Not on file  Social Connections: Not on file  Intimate Partner Violence: Not on file    Past Surgical History:  Procedure Laterality Date  . bone spur     removed  . CESAREAN SECTION  1995  . VAGINAL HYSTERECTOMY  1998   partial    Family History  Problem Relation Age of Onset  . Coronary artery disease Mother   . Hypertension Mother   . Kidney disease Mother 37       dialysis  . Aneurysm Mother        brain  . Stroke Mother   . Heart disease Mother        MI  . Cancer Father 56       brain cancer  . Diabetes Sister   . Hyperlipidemia Sister   . Hypertension Sister   . Arthritis Brother   . Hypertension Brother   . Diabetes Brother   . Hypertension Brother   . Hypertension Sister  Allergies  Allergen Reactions  . Lactose Intolerance (Gi)   . Lipitor [Atorvastatin Calcium]     Nerves, muscle spasms, light stool    Current Outpatient Medications on File Prior to Visit  Medication Sig Dispense Refill  . acyclovir ointment (ZOVIRAX) 5 % Apply tid prn 30 g 2  . albuterol (VENTOLIN HFA) 108 (90 Base) MCG/ACT inhaler Inhale 2 puffs into the lungs every 6 (six) hours as needed for wheezing or shortness of breath. 18 g 2  . Ascorbic Acid (VITAMIN C) 100 MG tablet Take 100 mg by mouth daily.    . benazepril (LOTENSIN) 10 MG tablet Take 1 tablet (10 mg total) by mouth daily. 90 tablet 3  . hydrochlorothiazide (HYDRODIURIL) 25 MG tablet Take 1 tablet (25 mg total) by mouth daily. 90 tablet 3  . Multiple Vitamin (MULTIVITAMIN WITH MINERALS) TABS Take 1 tablet by mouth daily.    Marland Kitchen omeprazole (PRILOSEC) 20 MG capsule Take  1 capsule (20 mg total) by mouth daily. 90 capsule 3  . omeprazole (PRILOSEC) 20 MG capsule Take 1 capsule (20 mg total) by mouth daily. 30 capsule 3  . Orlistat (ALLI PO) Take 1 capsule by mouth daily.    Marland Kitchen thiamine (VITAMIN B-1) 100 MG tablet Take 100 mg by mouth daily.    . traMADol (ULTRAM) 50 MG tablet Take 50 mg by mouth every 6 (six) hours as needed.    Marland Kitchen VITAMIN D PO Take 4,000 Units by mouth daily.    . Vitamin D, Ergocalciferol, (DRISDOL) 1.25 MG (50000 UT) CAPS capsule Take 1 capsule (50,000 Units total) by mouth every 7 (seven) days. 12 capsule 0  . acetaminophen (TYLENOL) 325 MG tablet Take 650 mg by mouth every 6 (six) hours as needed. (Patient not taking: Reported on 04/18/2020)    . CALCIUM PO Take 1 tablet by mouth daily. (Patient not taking: Reported on 04/18/2020)    . fluticasone (FLONASE) 50 MCG/ACT nasal spray Place 2 sprays into both nostrils daily. (Patient not taking: Reported on 04/18/2020) 16 g 6  . metroNIDAZOLE (FLAGYL) 500 MG tablet Take 1 tablet (500 mg total) by mouth 2 (two) times daily. No alcohol (Patient not taking: No sig reported) 14 tablet 0   Current Facility-Administered Medications on File Prior to Visit  Medication Dose Route Frequency Provider Last Rate Last Admin  . 0.9 %  sodium chloride infusion  500 mL Intravenous Continuous Rachael Fee, MD        BP 137/83   Pulse 66   Resp 16   Ht 5\' 5"  (1.651 m)   Wt 260 lb (117.9 kg)   SpO2 99%   BMI 43.27 kg/m       Objective:   Physical Exam  General Mental Status- Alert. General Appearance- Not in acute distress.   Skin General: Color- Normal Color. Moisture- Normal Moisture.  Neck Carotid Arteries- Normal color. Moisture- Normal Moisture. No carotid bruits. No JVD.  Chest and Lung Exam Auscultation: Breath Sounds:-Normal.  Cardiovascular Auscultation:Rythm- Regular. Murmurs & Other Heart Sounds:Auscultation of the heart reveals- No Murmurs.  Abdomen Inspection:-Inspeection  Normal. Palpation/Percussion:Note:No mass. Palpation and Percussion of the abdomen reveal- Non Tender, Non Distended + BS, no rebound or guarding.    Neurologic Cranial Nerve exam:- CN III-XII intact(No nystagmus), symmetric smile. Strength:- 5/5 equal and symmetric strength both upper and lower extremities.  Rt hip- no pain on palpation or rom presently.  Back- no mid l spine tender. Faint rt si tender.  Assessment & Plan:  For you wellness exam today I have ordered cbc, cmp and  lipid panel.  Counsel on covid vacccine.   Recommend exercise and healthy diet.  We will let you know lab results as they come in.  Follow up date appointment will be determined after lab review.   For rt hip pain referred to sports med MD. For hsv hx disussed how would potentially proceed in event of hsv flare in you got flare after covid vaccine.  [id number] as addressed hip pain, vaccine counseling and hsv questions.

## 2020-04-19 ENCOUNTER — Other Ambulatory Visit: Payer: BC Managed Care – PPO

## 2020-04-19 LAB — LIPID PANEL
Cholesterol: 239 mg/dL — ABNORMAL HIGH (ref 0–200)
HDL: 46.7 mg/dL (ref >39.00)
LDL Cholesterol: 165 mg/dL — ABNORMAL HIGH (ref 0–99)
NonHDL: 192.48
Total CHOL/HDL Ratio: 5
Triglycerides: 137 mg/dL (ref 0.0–149.0)
VLDL: 27.4 mg/dL (ref 0.0–40.0)

## 2020-04-19 LAB — COMPREHENSIVE METABOLIC PANEL
ALT: 11 U/L (ref 0–35)
AST: 14 U/L (ref 0–37)
Albumin: 4.4 g/dL (ref 3.5–5.2)
Alkaline Phosphatase: 55 U/L (ref 39–117)
BUN: 10 mg/dL (ref 6–23)
CO2: 32 mEq/L (ref 19–32)
Calcium: 9.7 mg/dL (ref 8.4–10.5)
Chloride: 102 mEq/L (ref 96–112)
Creatinine, Ser: 0.6 mg/dL (ref 0.40–1.20)
GFR: 101.81 mL/min (ref >60.00)
Glucose, Bld: 80 mg/dL (ref 70–99)
Potassium: 3.8 mEq/L (ref 3.5–5.1)
Sodium: 140 mEq/L (ref 135–145)
Total Bilirubin: 0.6 mg/dL (ref 0.2–1.2)
Total Protein: 7.3 g/dL (ref 6.0–8.3)

## 2020-04-19 LAB — CBC WITH DIFFERENTIAL/PLATELET
Basophils Absolute: 0.1 10*3/uL (ref 0.0–0.1)
Basophils Relative: 1.2 % (ref 0.0–3.0)
Eosinophils Absolute: 0.2 10*3/uL (ref 0.0–0.7)
Eosinophils Relative: 2.5 % (ref 0.0–5.0)
HCT: 38.8 % (ref 36.0–46.0)
Hemoglobin: 12.9 g/dL (ref 12.0–15.0)
Lymphocytes Relative: 52.4 % — ABNORMAL HIGH (ref 12.0–46.0)
Lymphs Abs: 3.4 10*3/uL (ref 0.7–4.0)
MCHC: 33.3 g/dL (ref 30.0–36.0)
MCV: 95.5 fl (ref 78.0–100.0)
Monocytes Absolute: 0.4 10*3/uL (ref 0.1–1.0)
Monocytes Relative: 6.8 % (ref 3.0–12.0)
Neutro Abs: 2.4 10*3/uL (ref 1.4–7.7)
Neutrophils Relative %: 37.1 % — ABNORMAL LOW (ref 43.0–77.0)
Platelets: 277 10*3/uL (ref 150.0–400.0)
RBC: 4.07 Mil/uL (ref 3.87–5.11)
RDW: 13.9 % (ref 11.5–15.5)
WBC: 6.4 10*3/uL (ref 4.0–10.5)

## 2020-04-24 ENCOUNTER — Ambulatory Visit: Payer: Self-pay

## 2020-04-24 ENCOUNTER — Ambulatory Visit: Payer: BC Managed Care – PPO | Admitting: Family Medicine

## 2020-04-24 ENCOUNTER — Other Ambulatory Visit: Payer: Self-pay

## 2020-04-24 VITALS — BP 138/74 | Ht 65.0 in | Wt 260.0 lb

## 2020-04-24 DIAGNOSIS — M7631 Iliotibial band syndrome, right leg: Secondary | ICD-10-CM

## 2020-04-24 DIAGNOSIS — G8929 Other chronic pain: Secondary | ICD-10-CM

## 2020-04-24 NOTE — Assessment & Plan Note (Signed)
Symptoms seem more consistent with IT band syndrome as well as some hip abduction weakness.  Does have degenerative changes of the knee but seems less likely as to the source of her pain. -Counseled on home exercise therapy and supportive care. -Counseled on ibuprofen. -Could consider physical therapy or injection.

## 2020-04-24 NOTE — Progress Notes (Signed)
Tiffany Gallagher - 55 y.o. female MRN [id number]  Date of birth: 22-Jul-1965  SUBJECTIVE:  Including CC & ROS.  No chief complaint on file.   Tiffany Gallagher is a 55 y.o. female that is presenting with acute on chronic right thigh and knee pain.  She feels the knee pain preceded the hip pain.  It is lateral in nature and radiates proximally.  No injury or inciting event.  No history of surgery.  Has been taking ibuprofen which does help her pain.  Has been doing workouts in order to lose weight..  Independent review of the right hip and pelvis x-ray from 5/28 shows no acute changes.   Review of Systems See HPI   HISTORY: Past Medical, Surgical, Social, and Family History Reviewed & Updated per EMR.   Pertinent Historical Findings include:  Past Medical History:  Diagnosis Date  . Allergy   . Diverticulitis   . Fibroid   . Hyperlipidemia   . Hypertension   . Migraines   . Obesity, morbid, BMI 40.0-49.9 ([place])   . Sleep apnea    cpap    Past Surgical History:  Procedure Laterality Date  . bone spur     removed  . CESAREAN SECTION  1995  . VAGINAL HYSTERECTOMY  1998   partial    Family History  Problem Relation Age of Onset  . Coronary artery disease Mother   . Hypertension Mother   . Kidney disease Mother 15       dialysis  . Aneurysm Mother        brain  . Stroke Mother   . Heart disease Mother        MI  . Cancer Father 11       brain cancer  . Diabetes Sister   . Hyperlipidemia Sister   . Hypertension Sister   . Arthritis Brother   . Hypertension Brother   . Diabetes Brother   . Hypertension Brother   . Hypertension Sister     Social History   Socioeconomic History  . Marital status: Divorced    Spouse name: Not on file  . Number of children: 1  . Years of education: 10  . Highest education level: Not on file  Occupational History  . Occupation: customer service   Tobacco Use  . Smoking status: Current Every Day Smoker    Packs/day: 0.50     Years: 30.00    Pack years: 15.00    Types: Cigarettes  . Smokeless tobacco: Never Used  . Tobacco comment: cutting back  Vaping Use  . Vaping Use: Never used  Substance and Sexual Activity  . Alcohol use: Yes    Alcohol/week: 0.0 standard drinks    Comment: rarely; once yearly  . Drug use: No  . Sexual activity: Yes    Partners: Male    Birth control/protection: Surgical, Condom  Other Topics Concern  . Not on file  Social History Narrative   Exercise-- no   Social Determinants of Health   Financial Resource Strain: Not on file  Food Insecurity: Not on file  Transportation Needs: Not on file  Physical Activity: Not on file  Stress: Not on file  Social Connections: Not on file  Intimate Partner Violence: Not on file     PHYSICAL EXAM:  VS: BP 138/74   Ht 5\' 5"  (1.651 m)   Wt 260 lb (117.9 kg)   BMI 43.27 kg/m  Physical Exam Gen: NAD, alert, cooperative with exam, well-appearing  MSK:  Right knee: No effusion. Normal range of motion. Normal strength resistance. Positive Noble's test. Negative Murray's test. Neurovascular intact  Limited ultrasound: Right knee:  No effusion suprapatellar pouch. Normal-appearing quadricep and patellar tendon. Moderate medial joint space narrowing with minimal degenerative change of the medial meniscus. Normal-appearing lateral joint space. Small irregularity of the hook on the lateral femoral condyle but no hyperemia  Summary: Degenerative changes of the medial joint space but not likely the source of the pain.  Ultrasound and interpretation by Clare Gandy, MD    ASSESSMENT & PLAN:   It band syndrome, right Symptoms seem more consistent with IT band syndrome as well as some hip abduction weakness.  Does have degenerative changes of the knee but seems less likely as to the source of her pain. -Counseled on home exercise therapy and supportive care. -Counseled on ibuprofen. -Could consider physical therapy or  injection.

## 2020-04-24 NOTE — Patient Instructions (Signed)
Nice to meet you Please try the exercises  Please try ibuprofen as needed  Please try ice as needed   Please send me a message in MyChart with any questions or updates.  Please see me back in 4 weeks.   --Dr. Jordan Likes

## 2020-04-25 ENCOUNTER — Telehealth: Payer: Self-pay | Admitting: Pulmonary Disease

## 2020-04-25 NOTE — Telephone Encounter (Signed)
ATC patient to get her scheduled for an appointment with APP tomorrow to get her a new CPAP machine since she has not been seen in 2 years.  When patient calls back please schedule her for an appointment

## 2020-04-26 ENCOUNTER — Other Ambulatory Visit: Payer: Self-pay

## 2020-04-26 ENCOUNTER — Ambulatory Visit: Payer: BC Managed Care – PPO | Admitting: Pulmonary Disease

## 2020-04-26 ENCOUNTER — Encounter: Payer: Self-pay | Admitting: Pulmonary Disease

## 2020-04-26 VITALS — BP 120/74 | HR 74 | Temp 97.3°F | Ht 64.0 in | Wt 267.1 lb

## 2020-04-26 DIAGNOSIS — Z0000 Encounter for general adult medical examination without abnormal findings: Secondary | ICD-10-CM | POA: Insufficient documentation

## 2020-04-26 DIAGNOSIS — F17200 Nicotine dependence, unspecified, uncomplicated: Secondary | ICD-10-CM

## 2020-04-26 DIAGNOSIS — G4733 Obstructive sleep apnea (adult) (pediatric): Secondary | ICD-10-CM

## 2020-04-26 NOTE — Assessment & Plan Note (Signed)
Plan: Referral to lung cancer screening program Strongly recommend the patient stop smoking Obtain COVID-19 vaccinations

## 2020-04-26 NOTE — Assessment & Plan Note (Signed)
Current smoker Smoking 0.5 to 1 pack/day 30-pack-year smoking history  Plan: Referral to lung cancer screening program Strongly recommend patient stop smoking Recommend flu vaccine, patient declined Recommend pneumonia vaccination Obtain COVID-19 vaccinations

## 2020-04-26 NOTE — Telephone Encounter (Signed)
Pt has an appt with Elisha Headland, NP, today 04/26/20 at 4pm. Will close message.

## 2020-04-26 NOTE — Assessment & Plan Note (Signed)
Patient was severe obstructive sleep apnea AHI of 151 Current CPAP is broken  Discussion: Personally contacted [facility] Representative Melissa.  Notified [facility] of patient CPAP being broken.  We will see if patient can have expedited CPAP process as patient is high risk for hospitalization and complications due to the severity of her obstructive sleep apnea.  Discussed with Efraim Kaufmann that would prefer ResMed machine.  If unable to obtain ResMed machine okay to receive Luna CPAP.  Plan: Discussed case with patient Notified her to let our office know she is not received a new CPAP within the next 5 to 7 days Patient to continue CPAP therapy New CPAP order placed [facility] to try to service machine if unable to replace with CPAP in timely manner Tentative follow-up in 3 months

## 2020-04-26 NOTE — Patient Instructions (Addendum)
You were seen today by Coral Ceo, NP  for:   1. OSA (obstructive sleep apnea)  We recommend that you continue using your CPAP daily >>>Keep up the hard work using your device >>> Goal should be wearing this for the entire night that you are sleeping, at least 4 to 6 hours  Remember:  . Do not drive or operate heavy machinery if tired or drowsy.  . Please notify the supply company and office if you are unable to use your device regularly due to missing supplies or machine being broken.  . Work on maintaining a healthy weight and following your recommended nutrition plan  . Maintain proper daily exercise and movement  . Maintaining proper use of your device can also help improve management of other chronic illnesses such as: Blood pressure, blood sugars, and weight management.   BiPAP/ CPAP Cleaning:  >>>Clean weekly, with Dawn soap, and bottle brush.  Set up to air dry. >>> Wipe mask out daily with wet wipe or towelette   2. Smoker  We will refer you today to our lung cancer screening program >>>This is based off of your 30 pack-year smoking history >>> This is a recommendation from the [place] preventative services task force (USPSTF) >>>The USPSTF recommends annual screening for lung cancer with low-dose computed tomography (LDCT) in adults aged 71 to 80 years who have a 20 pack-year smoking history and currently smoke or have quit within the past 15 years. Screening should be discontinued once a person has not smoked for 15 years or develops a health problem that substantially limits life expectancy or the ability or willingness to have curative lung surgery.   Our office will call you and set up an appointment with Kandice Robinsons (Nurse Practitioner) who leads this program.  This appointment takes place in our office.  After completing this meeting with Kandice Robinsons NP you will get a low-dose CT as the screening >>>We will call you with those results  We recommend that you stop smoking.   >>>You need to set a quit date >>>If you have friends or family who smoke, let them know you are trying to quit and not to smoke around you or in your living environment  Smoking Cessation Resources:  1 800 QUIT NOW  >>> Patient to call this resource and utilize it to help support her quit smoking >>> Keep up your hard work with stopping smoking  You can also contact the [facility] >>>For smoking cessation classes call [phone]  We do not recommend using e-cigarettes as a form of stopping smoking  You can sign up for smoking cessation support texts and information:  >>>https://smokefree.gov/smokefreetxt    3. Severe obesity (BMI >= 40) (HCC)  Continue to work with primary care on working to reduce your BMI  4. Healthcare maintenance  Obtain COVID-19 vaccinations   Follow Up:    Return in about 3 months (around 07/25/2020), or if symptoms worsen or fail to improve, for Follow up with Dr. Isaiah Serge.   Notification of test results are managed in the following manner: If there are  any recommendations or changes to the  plan of care discussed in office today,  we will contact you and let you know what they are. If you do not hear from [place], then your results are normal and you can view them through your  MyChart account , or a letter will be sent to you. Thank you again for trusting [place] with your care  -  Thank you, [facility]    It is flu season:   >>> Best ways to protect herself from the flu: Receive the yearly flu vaccine, practice good hand hygiene washing with soap and also using hand sanitizer when available, eat a nutritious meals, get adequate rest, hydrate appropriately       Please contact the office if your symptoms worsen or you have concerns that you are not improving.   Thank you for choosing [facility] for your healthcare, and for allowing [place] to partner with you on your healthcare journey. I am thankful to be able to provide  care to you today.   Wyn Quaker FNP-C

## 2020-04-26 NOTE — Assessment & Plan Note (Signed)
Plan: Continue work with primary care on working to reduce BMI

## 2020-04-26 NOTE — Progress Notes (Addendum)
@Patient  ID: Tiffany Gallagher, female    DOB: Aug 19, 1965, 55 y.o.   MRN: [id number]  Chief Complaint  Patient presents with  . Follow-up    CPAP not working, making a grinding sound    Referring provider: Carollee Herter, Alferd Apa, *  HPI:  55 year old female current everyday smoker followed in our office for severe obstructive sleep apnea  PMH: Hypertension, GERD, edema, severe morbid obesity Smoker/ Smoking History: Current everyday smoker.  Smoking 0.5 to 1 pack/day.  30-pack-year smoking history Maintenance: None Pt of: Dr. Vaughan Browner  04/26/2020  - Visit   55 year old female current everyday smoker followed in our office for severe obstructive sleep apnea.  Followed by Dr. Vaughan Browner.  Last seen by TP NP in February/2019.    Patient presenting today reporting that her CPAP is not working she reports to stop working on 04/21/2020.  Patient with severe obstructive sleep apnea with an AHI of 151 in 2016.  Patient feels that she started become symptomatic from not using her CPAP.  She is becoming a little bit more somnolent during the day.  Prior to CPAP breaking patient CPAP showed adequate compliance.  See compliance were listed below:  03/22/2020-04/20/2020-CPAP compliance report-25 out of last 30 days use, 19 of those days greater than 4 hours, average usage 5 hours and 35 minutes, CPAP set pressure 17, AHI 0.8.  Patient continues to smoke 0.5 to 1 pack/day.  She has to use her rescue Hailer occasionally about 1 time a month.  Patient remains unvaccinated COVID-19.  She is planning on obtaining this.  She hopes to have the first COVID-19 vaccination within the next week.  She does not receive the flu vaccine.  She reports that she "receive the flu from the flu vaccine over 15 years ago".  Questionaires / Pulmonary Flowsheets:   ACT:  No flowsheet data found.  MMRC: No flowsheet data found.  Epworth:  No flowsheet data found.  Tests:   Sleep study 03/2015 >severe AHI 151/hr ,  titration with 17cm optimal control   FENO:  No results found for: NITRICOXIDE  PFT: No flowsheet data found.  WALK:  No flowsheet data found.  Imaging: No results found.  Lab Results:  CBC    Component Value Date/Time   WBC 6.4 04/18/2020 1601   RBC 4.07 04/18/2020 1601   HGB 12.9 04/18/2020 1601   HCT 38.8 04/18/2020 1601   PLT 277.0 04/18/2020 1601   MCV 95.5 04/18/2020 1601   MCV 100.1 (A) 03/31/2012 1854   MCH 31.2 11/16/2017 1053   MCHC 33.3 04/18/2020 1601   RDW 13.9 04/18/2020 1601   LYMPHSABS 3.4 04/18/2020 1601   MONOABS 0.4 04/18/2020 1601   EOSABS 0.2 04/18/2020 1601   BASOSABS 0.1 04/18/2020 1601    BMET    Component Value Date/Time   NA 140 04/18/2020 1601   NA 139 10/21/2017 0000   K 3.8 04/18/2020 1601   CL 102 04/18/2020 1601   CO2 32 04/18/2020 1601   GLUCOSE 80 04/18/2020 1601   BUN 10 04/18/2020 1601   BUN 10 10/21/2017 0000   CREATININE 0.60 04/18/2020 1601   CREATININE 0.76 03/31/2012 1853   CALCIUM 9.7 04/18/2020 1601   GFRNONAA >90 08/22/2012 1356   GFRAA >90 08/22/2012 1356    BNP No results found for: BNP  ProBNP No results found for: PROBNP  Specialty Problems      Pulmonary Problems   Sinusitis   Acute bacterial bronchitis   OSA (obstructive sleep  apnea)   Mild intermittent asthma without complication      Allergies  Allergen Reactions  . Lactose Intolerance (Gi)   . Lipitor [Atorvastatin Calcium]     Nerves, muscle spasms, light stool    Immunization History  Administered Date(s) Administered  . Td 02/07/2008  . Tdap 03/15/2018    Past Medical History:  Diagnosis Date  . Allergy   . Diverticulitis   . Fibroid   . Hyperlipidemia   . Hypertension   . Migraines   . Obesity, morbid, BMI 40.0-49.9 ([place])   . Sleep apnea    cpap    Tobacco History: Social History   Tobacco Use  Smoking Status Current Every Day Smoker  . Packs/day: 1.00  . Years: 30.00  . Pack years: 30.00  . Types: Cigarettes   Smokeless Tobacco Never Used  Tobacco Comment   cutting back   Ready to quit: No Counseling given: Yes Comment: cutting back   Continue to not smoke  Outpatient Encounter Medications as of 04/26/2020  Medication Sig  . acetaminophen (TYLENOL) 325 MG tablet Take 650 mg by mouth every 6 (six) hours as needed.  Marland Kitchen acyclovir ointment (ZOVIRAX) 5 % Apply tid prn  . albuterol (VENTOLIN HFA) 108 (90 Base) MCG/ACT inhaler Inhale 2 puffs into the lungs every 6 (six) hours as needed for wheezing or shortness of breath.  . Ascorbic Acid (VITAMIN C) 100 MG tablet Take 100 mg by mouth daily.  . benazepril (LOTENSIN) 10 MG tablet Take 1 tablet (10 mg total) by mouth daily.  . Biotin 10 MG CAPS Take by mouth.  Marland Kitchen CALCIUM PO Take 1 tablet by mouth daily.  . fluticasone (FLONASE) 50 MCG/ACT nasal spray Place 2 sprays into both nostrils daily.  . hydrochlorothiazide (HYDRODIURIL) 25 MG tablet Take 1 tablet (25 mg total) by mouth daily.  . metroNIDAZOLE (FLAGYL) 500 MG tablet Take 1 tablet (500 mg total) by mouth 2 (two) times daily. No alcohol  . Multiple Vitamin (MULTIVITAMIN WITH MINERALS) TABS Take 1 tablet by mouth daily.  Marland Kitchen omeprazole (PRILOSEC) 20 MG capsule Take 1 capsule (20 mg total) by mouth daily.  Marland Kitchen omeprazole (PRILOSEC) 20 MG capsule Take 1 capsule (20 mg total) by mouth daily.  . Orlistat (ALLI PO) Take 1 capsule by mouth daily.  Marland Kitchen thiamine (VITAMIN B-1) 100 MG tablet Take 100 mg by mouth daily.  . traMADol (ULTRAM) 50 MG tablet Take 50 mg by mouth every 6 (six) hours as needed.  Marland Kitchen VITAMIN D PO Take 4,000 Units by mouth daily.  . Vitamin D, Ergocalciferol, (DRISDOL) 1.25 MG (50000 UT) CAPS capsule Take 1 capsule (50,000 Units total) by mouth every 7 (seven) days.   Facility-Administered Encounter Medications as of 04/26/2020  Medication  . 0.9 %  sodium chloride infusion     Review of Systems  Review of Systems  Constitutional: Negative for activity change, fatigue and fever.  HENT:  Negative for sinus pressure, sinus pain and sore throat.   Respiratory: Negative for cough, shortness of breath and wheezing.   Cardiovascular: Negative for chest pain and palpitations.  Gastrointestinal: Negative for diarrhea, nausea and vomiting.  Musculoskeletal: Negative for arthralgias.  Neurological: Negative for dizziness.  Psychiatric/Behavioral: Negative for sleep disturbance. The patient is not nervous/anxious.      Physical Exam  BP 120/74 (BP Location: Left Arm, Cuff Size: Normal)   Pulse 74   Temp (!) 97.3 F (36.3 C) (Oral)   Ht 5\' 4"  (1.626 m)   Wt  267 lb 1.6 oz (121.2 kg)   SpO2 100%   BMI 45.85 kg/m   Wt Readings from Last 5 Encounters:  04/26/20 267 lb 1.6 oz (121.2 kg)  04/24/20 260 lb (117.9 kg)  04/18/20 260 lb (117.9 kg)  09/16/19 263 lb 3.2 oz (119.4 kg)  07/04/19 261 lb (118.4 kg)    BMI Readings from Last 5 Encounters:  04/26/20 45.85 kg/m  04/24/20 43.27 kg/m  04/18/20 43.27 kg/m  09/16/19 43.80 kg/m  07/04/19 43.43 kg/m     Physical Exam Vitals and nursing note reviewed.  Constitutional:      General: She is not in acute distress.    Appearance: Normal appearance. She is obese.  HENT:     Head: Normocephalic and atraumatic.     Right Ear: External ear normal.     Left Ear: External ear normal.  Cardiovascular:     Rate and Rhythm: Normal rate and regular rhythm.     Pulses: Normal pulses.     Heart sounds: Normal heart sounds. No murmur heard.   Pulmonary:     Effort: Pulmonary effort is normal. No respiratory distress.     Breath sounds: Normal breath sounds. No decreased air movement. No decreased breath sounds, wheezing or rales.  Musculoskeletal:     Cervical back: Normal range of motion.  Skin:    General: Skin is warm and dry.     Capillary Refill: Capillary refill takes less than 2 seconds.  Neurological:     General: No focal deficit present.     Mental Status: She is alert and oriented to person, place, and time.  Mental status is at baseline.     Gait: Gait normal.  Psychiatric:        Mood and Affect: Mood normal.        Behavior: Behavior normal.        Thought Content: Thought content normal.        Judgment: Judgment normal.       Assessment & Plan:   OSA (obstructive sleep apnea) Patient was severe obstructive sleep apnea AHI of 151 Current CPAP is broken  Discussion: Personally contacted [facility] Representative Melissa.  Notified [facility] of patient CPAP being broken.  We will see if patient can have expedited CPAP process as patient is high risk for hospitalization and complications due to the severity of her obstructive sleep apnea.  Discussed with Efraim Kaufmann that would prefer ResMed machine.  If unable to obtain ResMed machine okay to receive Luna CPAP.  Plan: Discussed case with patient Notified her to let our office know she is not received a new CPAP within the next 5 to 7 days Patient to continue CPAP therapy New CPAP order placed [facility] to try to service machine if unable to replace with CPAP in timely manner Tentative follow-up in 3 months  Healthcare maintenance Plan: Referral to lung cancer screening program Strongly recommend the patient stop smoking Obtain COVID-19 vaccinations  Severe obesity (BMI >= 40) Plan: Continue work with primary care on working to reduce BMI  Smoker Current smoker Smoking 0.5 to 1 pack/day 30-pack-year smoking history  Plan: Referral to lung cancer screening program Strongly recommend patient stop smoking Recommend flu vaccine, patient declined Recommend pneumonia vaccination Obtain COVID-19 vaccinations    Return in about 3 months (around 07/25/2020), or if symptoms worsen or fail to improve, for Follow up with Dr. Isaiah Serge.   Coral Ceo, NP 04/26/2020   This appointment required 32 minutes of  patient care (this includes precharting, chart review, review of results, face-to-face care, etc.).

## 2020-05-03 DIAGNOSIS — G4733 Obstructive sleep apnea (adult) (pediatric): Secondary | ICD-10-CM | POA: Diagnosis not present

## 2020-05-09 DIAGNOSIS — G4733 Obstructive sleep apnea (adult) (pediatric): Secondary | ICD-10-CM | POA: Diagnosis not present

## 2020-05-23 ENCOUNTER — Ambulatory Visit: Payer: BC Managed Care – PPO | Admitting: Pulmonary Disease

## 2020-05-28 ENCOUNTER — Ambulatory Visit: Payer: BC Managed Care – PPO | Admitting: Family Medicine

## 2020-06-03 DIAGNOSIS — G4733 Obstructive sleep apnea (adult) (pediatric): Secondary | ICD-10-CM | POA: Diagnosis not present

## 2020-06-18 ENCOUNTER — Ambulatory Visit: Payer: BC Managed Care – PPO | Admitting: Family Medicine

## 2020-06-30 ENCOUNTER — Encounter: Payer: Self-pay | Admitting: Medical

## 2020-07-01 DIAGNOSIS — G4733 Obstructive sleep apnea (adult) (pediatric): Secondary | ICD-10-CM | POA: Diagnosis not present

## 2020-07-04 ENCOUNTER — Other Ambulatory Visit: Payer: Self-pay

## 2020-07-04 ENCOUNTER — Encounter: Payer: Self-pay | Admitting: Nurse Practitioner

## 2020-07-04 ENCOUNTER — Ambulatory Visit (INDEPENDENT_AMBULATORY_CARE_PROVIDER_SITE_OTHER): Payer: BC Managed Care – PPO | Admitting: Nurse Practitioner

## 2020-07-04 ENCOUNTER — Encounter: Payer: BC Managed Care – PPO | Admitting: Nurse Practitioner

## 2020-07-04 VITALS — BP 138/88 | HR 96 | Resp 14 | Ht 65.0 in | Wt 270.0 lb

## 2020-07-04 DIAGNOSIS — A609 Anogenital herpesviral infection, unspecified: Secondary | ICD-10-CM

## 2020-07-04 DIAGNOSIS — Z01419 Encounter for gynecological examination (general) (routine) without abnormal findings: Secondary | ICD-10-CM

## 2020-07-04 DIAGNOSIS — Z90710 Acquired absence of both cervix and uterus: Secondary | ICD-10-CM | POA: Diagnosis not present

## 2020-07-04 MED ORDER — VALACYCLOVIR HCL 500 MG PO TABS: 500.0000 mg | ORAL_TABLET | Freq: Two times a day (BID) | ORAL | 12 refills | Status: DC

## 2020-07-04 NOTE — Progress Notes (Signed)
   Tiffany Gallagher [birth year] [id number]   History:  55 y.o. G2P1011 presents for annual exam. 1998 TVH for fibroids, on no HRT. Normal pap history. Benign cyst seen on left breast 08/2019, otherwise normal mammogram history. History of HSV-1 and has been having more frequent genital outbreaks. She has also noticed sores on her face that are taking over a month to heal. She tried Valtrex daily in the past but had itching with continuous use. She does use acyclovir ointment.   Gynecologic History No LMP recorded. Patient has had a hysterectomy.   Contraception/Family planning: status post hysterectomy  Health Maintenance Last Pap: No longer screening per guidelines Last mammogram: 08/2019. Results were: left benign cyst Last colonoscopy: 2018. Results were: polyps Last Dexa: 4-5 years ago. Results were: normal (managed by PCP)  Past medical history, past surgical history, family history and social history were all reviewed and documented in the EPIC chart.  ROS:  A ROS was performed and pertinent positives and negatives are included.  Exam:  Vitals:   07/04/20 0900  BP: 138/88  Pulse: 96  Resp: 14  Weight: 270 lb (122.5 kg)  Height: 5\' 5"  (1.651 m)   Body mass index is 44.93 kg/m.  General appearance:  Normal Thyroid:  Symmetrical, normal in size, without palpable masses or nodularity. Respiratory  Auscultation:  Clear without wheezing or rhonchi Cardiovascular  Auscultation:  Regular rate, without rubs, murmurs or gallops  Edema/varicosities:  Not grossly evident Abdominal  Soft,nontender, without masses, guarding or rebound.  Liver/spleen:  No organomegaly noted  Hernia:  None appreciated  Skin  Inspection:  Grossly normal   Breasts: Examined lying and sitting.   Right: Without masses, retractions, discharge or axillary adenopathy.   Left: Without masses, retractions, discharge or axillary adenopathy. Gentitourinary   Inguinal/mons:  Normal without inguinal  adenopathy  External genitalia:  Normal  BUS/Urethra/Skene's glands:  Normal  Vagina:  Normal  Cervix:  Absent  Uterus:  Absent  Adnexa/parametria:     Rt: Without masses or tenderness.   Lt: Without masses or tenderness.  Anus and perineum: Normal  Digital rectal exam: Normal sphincter tone without palpated masses or tenderness  Assessment/Plan:  55 y.o. G2P1011 for annual exam.   Well female exam with routine gynecological exam - Education provided on SBEs, importance of preventative screenings, current guidelines, high calcium diet, regular exercise, and multivitamin daily. Labs with PCP.   History of total vaginal hysterectomy (TVH) - 1990 for fibroids, menorrhagia. She has started having mild menopausal symptoms. No HRT.   HSV (herpes simplex virus) anogenital infection - Plan: valACYclovir (VALTREX) 500 MG tablet as needed. She is aware to take at first sign of outbreak. If she continues to have outbreaks it is recommended she take daily for suppression.  Screening for cervical cancer - Normal Pap history.  No longer screening per guidelines.   Screening for breast cancer - benign cyst of left breast seen 08/2019, otherwise normal mammogram history.  Continue annual screenings.  Normal breast exam today.  Screening for colon cancer - 2018 colonoscopy. Will repeat at GI's recommended interval.   Return in 1 year for annual.                   Tamela Gammon DNP, 9:17 AM 07/04/2020

## 2020-07-04 NOTE — Patient Instructions (Signed)
Health Maintenance, Female Adopting a healthy lifestyle and getting preventive care are important in promoting health and wellness. Ask your health care provider about:  The right schedule for you to have regular tests and exams.  Things you can do on your own to prevent diseases and keep yourself healthy. What should I know about diet, weight, and exercise? Eat a healthy diet  Eat a diet that includes plenty of vegetables, fruits, low-fat dairy products, and lean protein.  Do not eat a lot of foods that are high in solid fats, added sugars, or sodium.   Maintain a healthy weight Body mass index (BMI) is used to identify weight problems. It estimates body fat based on height and weight. Your health care provider can help determine your BMI and help you achieve or maintain a healthy weight. Get regular exercise Get regular exercise. This is one of the most important things you can do for your health. Most adults should:  Exercise for at least 150 minutes each week. The exercise should increase your heart rate and make you sweat (moderate-intensity exercise).  Do strengthening exercises at least twice a week. This is in addition to the moderate-intensity exercise.  Spend less time sitting. Even light physical activity can be beneficial. Watch cholesterol and blood lipids Have your blood tested for lipids and cholesterol at 55 years of age, then have this test every 5 years. Have your cholesterol levels checked more often if:  Your lipid or cholesterol levels are high.  You are older than 55 years of age.  You are at high risk for heart disease. What should I know about cancer screening? Depending on your health history and family history, you may need to have cancer screening at various ages. This may include screening for:  Breast cancer.  Cervical cancer.  Colorectal cancer.  Skin cancer.  Lung cancer. What should I know about heart disease, diabetes, and high blood  pressure? Blood pressure and heart disease  High blood pressure causes heart disease and increases the risk of stroke. This is more likely to develop in people who have high blood pressure readings, are of African descent, or are overweight.  Have your blood pressure checked: ? Every 3-5 years if you are 18-39 years of age. ? Every year if you are 40 years old or older. Diabetes Have regular diabetes screenings. This checks your fasting blood sugar level. Have the screening done:  Once every three years after age 40 if you are at a normal weight and have a low risk for diabetes.  More often and at a younger age if you are overweight or have a high risk for diabetes. What should I know about preventing infection? Hepatitis B If you have a higher risk for hepatitis B, you should be screened for this virus. Talk with your health care provider to find out if you are at risk for hepatitis B infection. Hepatitis C Testing is recommended for:  Everyone born from 1945 through 1965.  Anyone with known risk factors for hepatitis C. Sexually transmitted infections (STIs)  Get screened for STIs, including gonorrhea and chlamydia, if: ? You are sexually active and are younger than 55 years of age. ? You are older than 55 years of age and your health care provider tells you that you are at risk for this type of infection. ? Your sexual activity has changed since you were last screened, and you are at increased risk for chlamydia or gonorrhea. Ask your health care provider   if you are at risk.  Ask your health care provider about whether you are at high risk for HIV. Your health care provider may recommend a prescription medicine to help prevent HIV infection. If you choose to take medicine to prevent HIV, you should first get tested for HIV. You should then be tested every 3 months for as long as you are taking the medicine. Pregnancy  If you are about to stop having your period (premenopausal) and  you may become pregnant, seek counseling before you get pregnant.  Take 400 to 800 micrograms (mcg) of folic acid every day if you become pregnant.  Ask for birth control (contraception) if you want to prevent pregnancy. Osteoporosis and menopause Osteoporosis is a disease in which the bones lose minerals and strength with aging. This can result in bone fractures. If you are 65 years old or older, or if you are at risk for osteoporosis and fractures, ask your health care provider if you should:  Be screened for bone loss.  Take a calcium or vitamin D supplement to lower your risk of fractures.  Be given hormone replacement therapy (HRT) to treat symptoms of menopause. Follow these instructions at home: Lifestyle  Do not use any products that contain nicotine or tobacco, such as cigarettes, e-cigarettes, and chewing tobacco. If you need help quitting, ask your health care provider.  Do not use street drugs.  Do not share needles.  Ask your health care provider for help if you need support or information about quitting drugs. Alcohol use  Do not drink alcohol if: ? Your health care provider tells you not to drink. ? You are pregnant, may be pregnant, or are planning to become pregnant.  If you drink alcohol: ? Limit how much you use to 0-1 drink a day. ? Limit intake if you are breastfeeding.  Be aware of how much alcohol is in your drink. In the [place] one drink equals one 12 oz bottle of beer (355 mL), one 5 oz glass of wine (148 mL), or one 1 oz glass of hard liquor (44 mL). General instructions  Schedule regular health, dental, and eye exams.  Stay current with your vaccines.  Tell your health care provider if: ? You often feel depressed. ? You have ever been abused or do not feel safe at home. Summary  Adopting a healthy lifestyle and getting preventive care are important in promoting health and wellness.  Follow your health care provider's instructions about healthy  diet, exercising, and getting tested or screened for diseases.  Follow your health care provider's instructions on monitoring your cholesterol and blood pressure. This information is not intended to replace advice given to you by your health care provider. Make sure you discuss any questions you have with your health care provider. Document Revised: 03/31/2018 Document Reviewed: 03/31/2018 Elsevier Patient Education  2021 [employer]  

## 2020-08-01 DIAGNOSIS — G4733 Obstructive sleep apnea (adult) (pediatric): Secondary | ICD-10-CM | POA: Diagnosis not present

## 2020-08-14 ENCOUNTER — Other Ambulatory Visit: Payer: Self-pay

## 2020-08-14 ENCOUNTER — Ambulatory Visit: Payer: BC Managed Care – PPO | Admitting: Family

## 2020-08-14 VITALS — BP 127/59 | HR 76 | Temp 98.6°F | Resp 18 | Ht 65.0 in | Wt 271.6 lb

## 2020-08-14 DIAGNOSIS — R21 Rash and other nonspecific skin eruption: Secondary | ICD-10-CM

## 2020-08-14 NOTE — Progress Notes (Signed)
Subjective:    Patient ID: Tiffany Gallagher, female    DOB: [birth year], 55 y.o.   MRN: [id number]  HPI  Patient is a 55 year old female who presents today with chief complaint of rash. She is concerned that the lesions that she has are herpes lesions.  She reports that she has had some lesions on her arms, cheek, and now on her left lower back.  She does have a history of HSV1 for which she uses valtrex. She reports that she has itching with valtrex. Never any tongue or lip swelling.   Reports itching with valtrex.    Review of Systems See HPI  Past Medical History:  Diagnosis Date  . Allergy   . Diverticulitis   . Fibroid   . Hyperlipidemia   . Hypertension   . Migraines   . Obesity, morbid, BMI 40.0-49.9 ([place])   . Sleep apnea    cpap     Social History   Socioeconomic History  . Marital status: Divorced    Spouse name: Not on file  . Number of children: 1  . Years of education: 47  . Highest education level: Not on file  Occupational History  . Occupation: customer service   Tobacco Use  . Smoking status: Current Every Day Smoker    Packs/day: 1.00    Years: 30.00    Pack years: 30.00    Types: Cigarettes  . Smokeless tobacco: Never Used  . Tobacco comment: cutting back  Vaping Use  . Vaping Use: Never used  Substance and Sexual Activity  . Alcohol use: Yes    Alcohol/week: 0.0 standard drinks    Comment: rarely; once yearly  . Drug use: No  . Sexual activity: Yes    Partners: Male    Birth control/protection: Surgical, Condom  Other Topics Concern  . Not on file  Social History Narrative   Exercise-- no   Social Determinants of Health   Financial Resource Strain: Not on file  Food Insecurity: Not on file  Transportation Needs: Not on file  Physical Activity: Not on file  Stress: Not on file  Social Connections: Not on file  Intimate Partner Violence: Not on file    Past Surgical History:  Procedure Laterality Date  . bone spur      removed  . CESAREAN SECTION  1995  . VAGINAL HYSTERECTOMY  1998   partial    Family History  Problem Relation Age of Onset  . Coronary artery disease Mother   . Hypertension Mother   . Kidney disease Mother 35       dialysis  . Aneurysm Mother        brain  . Stroke Mother   . Heart disease Mother        MI  . Cancer Father 60       brain cancer  . Diabetes Sister   . Hyperlipidemia Sister   . Hypertension Sister   . Arthritis Brother   . Hypertension Brother   . Diabetes Brother   . Hypertension Brother   . Hypertension Sister     Allergies  Allergen Reactions  . Lactose Intolerance (Gi)   . Valtrex [Valacyclovir]     itching  . Lipitor [Atorvastatin Calcium]     Nerves, muscle spasms, light stool    Current Outpatient Medications on File Prior to Visit  Medication Sig Dispense Refill  . acetaminophen (TYLENOL) 325 MG tablet Take 650 mg by mouth every 6 (six) hours as needed.    Marland Kitchen  acyclovir ointment (ZOVIRAX) 5 % Apply tid prn 30 g 2  . albuterol (VENTOLIN HFA) 108 (90 Base) MCG/ACT inhaler Inhale 2 puffs into the lungs every 6 (six) hours as needed for wheezing or shortness of breath. 18 g 2  . Ascorbic Acid (VITAMIN C) 100 MG tablet Take 100 mg by mouth daily.    . benazepril (LOTENSIN) 10 MG tablet Take 1 tablet (10 mg total) by mouth daily. 90 tablet 3  . Biotin 10 MG CAPS Take by mouth.    Marland Kitchen CALCIUM PO Take 1 tablet by mouth daily.    . fluticasone (FLONASE) 50 MCG/ACT nasal spray Place 2 sprays into both nostrils daily. 16 g 6  . hydrochlorothiazide (HYDRODIURIL) 25 MG tablet Take 1 tablet (25 mg total) by mouth daily. 90 tablet 3  . Multiple Vitamin (MULTIVITAMIN WITH MINERALS) TABS Take 1 tablet by mouth daily.    . Omega-3 Fatty Acids (FISH OIL PO) Take by mouth.    . Orlistat (ALLI PO) Take 1 capsule by mouth daily.    Marland Kitchen thiamine (VITAMIN B-1) 100 MG tablet Take 100 mg by mouth daily.    . traMADol (ULTRAM) 50 MG tablet Take 50 mg by mouth every 6 (six)  hours as needed.    Marland Kitchen VITAMIN D PO Take 4,000 Units by mouth daily.    . Vitamin D, Ergocalciferol, (DRISDOL) 1.25 MG (50000 UT) CAPS capsule Take 1 capsule (50,000 Units total) by mouth every 7 (seven) days. 12 capsule 0  . omeprazole (PRILOSEC) 20 MG capsule Take 1 capsule (20 mg total) by mouth daily. 90 capsule 3   No current facility-administered medications on file prior to visit.    BP (!) 127/59 (BP Location: Left Arm, Patient Position: Sitting, Cuff Size: Large)   Pulse 76   Temp 98.6 F (37 C) (Oral)   Resp 18   Ht 5\' 5"  (1.651 m)   Wt 271 lb 9.6 oz (123.2 kg)   SpO2 100%   BMI 45.20 kg/m       Objective:   Physical Exam Constitutional:      Appearance: She is well-developed.  Neck:     Thyroid: No thyromegaly.  Cardiovascular:     Rate and Rhythm: Normal rate and regular rhythm.     Heart sounds: Normal heart sounds. No murmur heard.   Pulmonary:     Effort: Pulmonary effort is normal. No respiratory distress.     Breath sounds: Normal breath sounds. No wheezing.  Musculoskeletal:     Cervical back: Neck supple.  Skin:    General: Skin is warm and dry.     Comments: Some acne like lesions noted on right cheek.   No current lesions noted on arms  Small patch of raised lesions noted on left lower back  Neurological:     Mental Status: She is alert and oriented to person, place, and time.  Psychiatric:        Behavior: Behavior normal.        Thought Content: Thought content normal.        Judgment: Judgment normal.           Assessment & Plan:  Skin rash- discussed that lesions on left low back may represent early shingles.  Since she has had itching on valtrex, will change to acyclovir to see if she can tolerate this better.  She is advised to call if symptoms worsen or if symptoms fail to improve. Reassurance provided that the other lesions do  not represent HSV lesions.   This visit occurred during the SARS-CoV-2 public health emergency.  Safety  protocols were in place, including screening questions prior to the visit, additional usage of staff PPE, and extensive cleaning of exam room while observing appropriate contact time as indicated for disinfecting solutions.

## 2020-08-14 NOTE — Patient Instructions (Signed)
Please try acyclovir instead of valtrex to see if this causes you less itching. Call if symptoms worsen or if you have itching with valtrex.  Call if rash worsens or if not improved in 1 week.

## 2020-08-20 ENCOUNTER — Telehealth: Payer: Self-pay | Admitting: Family Medicine

## 2020-08-20 ENCOUNTER — Other Ambulatory Visit: Payer: Self-pay

## 2020-08-20 ENCOUNTER — Other Ambulatory Visit: Payer: Self-pay | Admitting: Family Medicine

## 2020-08-20 DIAGNOSIS — B009 Herpesviral infection, unspecified: Secondary | ICD-10-CM

## 2020-08-20 MED ORDER — VALACYCLOVIR HCL 1 G PO TABS
1000.0000 mg | ORAL_TABLET | Freq: Three times a day (TID) | ORAL | 0 refills | Status: AC
Start: 2020-08-20 — End: 2020-08-30

## 2020-08-20 NOTE — Telephone Encounter (Signed)
Per ov note 4-26:   Since she has had itching on valtrex, will change to acyclovir to see if she can tolerate this better.  Patient also provider said she was going to get oral form instead of cream.  Rx was not sent yet.

## 2020-08-20 NOTE — Telephone Encounter (Signed)
Melissa told patient she was going to try her on Valtrex instead of acyclovir ointment (ZOVIRAX) 5 % [[id number]. However no new med was called in.  Patient is still itching and rash is spreading. Please advise if Valtrex will still be called in for her.

## 2020-08-20 NOTE — Telephone Encounter (Signed)
I have sent in valtrex but remind pt she had listed it as an allergiyin past---- really an intolerance not allergy--- if it occurs again --- let us know

## 2020-08-20 NOTE — Telephone Encounter (Signed)
Pt is aware and voices understanding.

## 2020-08-31 DIAGNOSIS — G4733 Obstructive sleep apnea (adult) (pediatric): Secondary | ICD-10-CM | POA: Diagnosis not present

## 2020-10-01 DIAGNOSIS — G4733 Obstructive sleep apnea (adult) (pediatric): Secondary | ICD-10-CM | POA: Diagnosis not present

## 2020-10-06 ENCOUNTER — Other Ambulatory Visit: Payer: Self-pay | Admitting: Family Medicine

## 2020-10-06 DIAGNOSIS — I10 Essential (primary) hypertension: Secondary | ICD-10-CM

## 2020-10-31 DIAGNOSIS — G4733 Obstructive sleep apnea (adult) (pediatric): Secondary | ICD-10-CM | POA: Diagnosis not present

## 2020-11-16 ENCOUNTER — Other Ambulatory Visit: Payer: Self-pay | Admitting: Family Medicine

## 2020-11-16 DIAGNOSIS — Z1231 Encounter for screening mammogram for malignant neoplasm of breast: Secondary | ICD-10-CM

## 2020-11-28 ENCOUNTER — Ambulatory Visit (HOSPITAL_COMMUNITY)
Admission: EM | Admit: 2020-11-28 | Discharge: 2020-11-28 | Disposition: A | Discharge status: Home / Self Care | Payer: BC Managed Care – PPO | Attending: Student | Admitting: Student

## 2020-11-28 ENCOUNTER — Encounter (HOSPITAL_COMMUNITY): Payer: Self-pay

## 2020-11-28 ENCOUNTER — Other Ambulatory Visit: Payer: Self-pay

## 2020-11-28 DIAGNOSIS — Z1152 Encounter for screening for COVID-19: Secondary | ICD-10-CM | POA: Diagnosis not present

## 2020-11-28 DIAGNOSIS — Z112 Encounter for screening for other bacterial diseases: Secondary | ICD-10-CM | POA: Insufficient documentation

## 2020-11-28 DIAGNOSIS — R509 Fever, unspecified: Secondary | ICD-10-CM | POA: Diagnosis not present

## 2020-11-28 LAB — POC INFLUENZA A AND B ANTIGEN (URGENT CARE ONLY)
INFLUENZA A ANTIGEN, POC: NEGATIVE
INFLUENZA B ANTIGEN, POC: NEGATIVE

## 2020-11-28 LAB — POCT RAPID STREP A, ED / UC: Streptococcus, Group A Screen (Direct): NEGATIVE

## 2020-11-28 LAB — SARS CORONAVIRUS 2 (TAT 6-24 HRS): SARS Coronavirus 2: POSITIVE — AB

## 2020-11-28 MED ORDER — BENZONATATE 100 MG PO CAPS
100.0000 mg | ORAL_CAPSULE | Freq: Three times a day (TID) | ORAL | 0 refills | Status: DC
Start: 2020-11-28 — End: 2021-07-25

## 2020-11-28 MED ORDER — ACETAMINOPHEN 325 MG PO TABS
ORAL_TABLET | ORAL | Status: AC
  Filled 2020-11-28: qty 2

## 2020-11-28 MED ORDER — ACETAMINOPHEN 325 MG PO TABS
650.0000 mg | ORAL_TABLET | Freq: Once | ORAL | Status: AC
  Administered 2020-11-28: 650 mg via ORAL

## 2020-11-28 MED ORDER — PROMETHAZINE-DM 6.25-15 MG/5ML PO SYRP
5.0000 mL | ORAL_SOLUTION | Freq: Four times a day (QID) | ORAL | 0 refills | Status: DC | PRN
Start: 2020-11-28 — End: 2021-11-10

## 2020-11-28 NOTE — ED Triage Notes (Signed)
Pt presents with a headache, sore throat, chills, body aches and chest pain when breathing in.   States she has had a cough that she has not been able to get rid of.

## 2020-11-28 NOTE — Discharge Instructions (Addendum)
-  Promethazine DM cough syrup for congestion/cough. This could make you drowsy, so take at night before bed. -Tessalon (Benzonatate) as needed for cough. Take one pill up to 3x daily (every 8 hours) -Over-the-counter medications for additional relief (mucinex, dayquil, nyquil, etc) -For fevers/chills, bodyaches, headaches- -You can take Tylenol up to 1000 mg 3 times daily, and ibuprofen up to 600 mg 3 times daily with food.  You can take these together, or alternate every 3-4 hours. -Drink plenty of fluids and get plenty of rest -With a virus, you're typically contagious for 5-7 days, or as long as you're having fevers.

## 2020-11-28 NOTE — ED Provider Notes (Signed)
[facility]    CSN: [id number] Arrival date & time: 11/28/20  [id number]      History   Chief Complaint Chief Complaint  Patient presents with   Cough   Generalized Body Aches   Sore Throat   Chest Pain    HPI Tiffany Gallagher is a 55 y.o. female presenting with viral syndrome x1 day. Medical history obesity, hypertension, diverticulitis. Notes frequent nonproductive cough, generalized body aches, headaches, sore throat, chest wall pain,, decreased appetite for about 1 day.  Has not taken any medications for the symptoms. Exposure to covid at work. Denies n/v/d, shortness of breath, left sided chest pain, dizziness, waekness,  facial pain, teeth pain, headaches,  loss of taste/smell, swollen lymph nodes, ear pain.    HPI  Past Medical History:  Diagnosis Date   Allergy    Diverticulitis    Fibroid    Hyperlipidemia    Hypertension    Migraines    Obesity, morbid, BMI 40.0-49.9 ([place])    Sleep apnea    cpap    Patient Active Problem List   Diagnosis Date Noted   History of total vaginal hysterectomy (TVH) 07/04/2020   Healthcare maintenance 04/26/2020   It band syndrome, right 04/24/2020   Hyperlipidemia 09/19/2019   Mild intermittent asthma without complication [id number]   Hair loss 09/19/2019   Dyspepsia 09/19/2019   Right hip pain 09/19/2019   Leg skin lesion, right 06/09/2017   OSA (obstructive sleep apnea) 06/05/2016   Preventative health care 02/09/2015   Acute bacterial bronchitis 11/15/2014   Severe obesity (BMI >= 40) ([place]) 05/10/2014   Physical exam 01/20/2014   HSV-1 infection 06/20/2013   Migraines    Smoker    Sinusitis 04/11/2011   Leg cramps 09/18/2010   Edema 09/18/2010   VAGINAL DISCHARGE 09/20/2009   KNEE PAIN, BILATERAL 09/20/2009   FLATULENCE ERUCTATION AND GAS PAIN 08/03/2009   HEMATURIA, HX OF 07/04/2009   Essential hypertension 02/07/2008   GERD 11/30/2006    Past Surgical History:  Procedure Laterality Date    bone spur     removed   [place]   partial    OB History     Gravida  2   Para  1   Term  1   Preterm      AB  1   Living  1      SAB      IAB      Ectopic      Multiple      Live Births               Home Medications    Prior to Admission medications   Medication Sig Start Date End Date Taking? Authorizing Provider  benzonatate (TESSALON) 100 MG capsule Take 1 capsule (100 mg total) by mouth every 8 (eight) hours. 11/28/20  Yes Hazel Sams, PA-C  promethazine-dextromethorphan (PROMETHAZINE-DM) 6.25-15 MG/5ML syrup Take 5 mLs by mouth 4 (four) times daily as needed for cough. 11/28/20  Yes Hazel Sams, PA-C  acetaminophen (TYLENOL) 325 MG tablet Take 650 mg by mouth every 6 (six) hours as needed.    [provider]  acyclovir ointment (ZOVIRAX) 5 % Apply tid prn 09/16/19   Carollee Herter, Alferd Apa, DO  albuterol (VENTOLIN HFA) 108 (90 Base) MCG/ACT inhaler Inhale 2 puffs into the lungs every 6 (six) hours as needed for wheezing or shortness of breath. 09/16/19  Carollee Herter, Yvonne R, DO  Ascorbic Acid (VITAMIN C) 100 MG tablet Take 100 mg by mouth daily.    [provider]  benazepril (LOTENSIN) 10 MG tablet TAKE 1 TABLET BY MOUTH EVERY DAY 10/08/20   Carollee Herter, Alferd Apa, DO  Biotin 10 MG CAPS Take by mouth.    [provider]  CALCIUM PO Take 1 tablet by mouth daily.    [provider]  fluticasone (FLONASE) 50 MCG/ACT nasal spray Place 2 sprays into both nostrils daily. 06/09/17   Roma Schanz R, DO  hydrochlorothiazide (HYDRODIURIL) 25 MG tablet TAKE 1 TABLET BY MOUTH EVERY DAY 10/08/20   Ann Held, DO  Multiple Vitamin (MULTIVITAMIN WITH MINERALS) TABS Take 1 tablet by mouth daily.    [provider]  Omega-3 Fatty Acids (FISH OIL PO) Take by mouth.    [provider]  omeprazole (PRILOSEC) 20 MG capsule Take 1 capsule (20 mg total) by mouth  daily. 03/15/18 07/22/20  Ann Held, DO  Orlistat (ALLI PO) Take 1 capsule by mouth daily.    [provider]  thiamine (VITAMIN B-1) 100 MG tablet Take 100 mg by mouth daily.    [provider]  traMADol (ULTRAM) 50 MG tablet Take 50 mg by mouth every 6 (six) hours as needed.    [provider]  VITAMIN D PO Take 4,000 Units by mouth daily.    [provider]  Vitamin D, Ergocalciferol, (DRISDOL) 1.25 MG (50000 UT) CAPS capsule Take 1 capsule (50,000 Units total) by mouth every 7 (seven) days. 03/22/19   Ann Held, DO    Family History Family History  Problem Relation Age of Onset   Coronary artery disease Mother    Hypertension Mother    Kidney disease Mother 73       dialysis   Aneurysm Mother        brain   Stroke Mother    Heart disease Mother        MI   Cancer Father 1       brain cancer   Diabetes Sister    Hyperlipidemia Sister    Hypertension Sister    Arthritis Brother    Hypertension Brother    Diabetes Brother    Hypertension Brother    Hypertension Sister     Social History Social History   Tobacco Use   Smoking status: Every Day    Packs/day: 1.00    Years: 30.00    Pack years: 30.00    Types: Cigarettes   Smokeless tobacco: Never   Tobacco comments:    cutting back  Vaping Use   Vaping Use: Never used  Substance Use Topics   Alcohol use: Yes    Alcohol/week: 0.0 standard drinks    Comment: rarely; once yearly   Drug use: No     Allergies   Lactose intolerance (gi), Valtrex [valacyclovir], and Lipitor [atorvastatin calcium]   Review of Systems Review of Systems  Constitutional:  Positive for chills, fatigue and fever. Negative for appetite change.  HENT:  Positive for congestion. Negative for ear pain, rhinorrhea, sinus pressure, sinus pain and sore throat.   Eyes:  Negative for redness and visual disturbance.  Respiratory:  Positive for cough. Negative for chest tightness,  shortness of breath and wheezing.   Cardiovascular:  Negative for chest pain and palpitations.  Gastrointestinal:  Negative for abdominal pain, constipation, diarrhea, nausea and vomiting.  Genitourinary:  Negative for  dysuria, frequency and urgency.  Musculoskeletal:  Positive for myalgias.  Neurological:  Negative for dizziness, weakness and headaches.  Psychiatric/Behavioral:  Negative for confusion.   All other systems reviewed and are negative.   Physical Exam Triage Vital Signs ED Triage Vitals  Enc Vitals Group     BP 11/28/20 1040 140/65     Pulse Rate 11/28/20 1039 81     Resp 11/28/20 1039 19     Temp 11/28/20 1040 (!) 102.1 F (38.9 C)     Temp Source 11/28/20 1040 Oral     SpO2 11/28/20 1039 98 %     Weight --      Height --      Head Circumference --      Peak Flow --      Pain Score 11/28/20 1038 5     Pain Loc --      Pain Edu? --      Excl. in [place]? --    No data found.  Updated Vital Signs BP 140/65 (BP Location: Right Arm)   Pulse 81   Temp (!) 102.1 F (38.9 C) (Oral)   Resp 19   SpO2 98%   Visual Acuity Right Eye Distance:   Left Eye Distance:   Bilateral Distance:    Right Eye Near:   Left Eye Near:    Bilateral Near:     Physical Exam Vitals reviewed.  Constitutional:      General: She is not in acute distress.    Appearance: Normal appearance. She is obese. She is ill-appearing.  HENT:     Head: Normocephalic and atraumatic.     Right Ear: Tympanic membrane, ear canal and external ear normal. No tenderness. No middle ear effusion. There is no impacted cerumen. Tympanic membrane is not perforated, erythematous, retracted or bulging.     Left Ear: Tympanic membrane, ear canal and external ear normal. No tenderness.  No middle ear effusion. There is no impacted cerumen. Tympanic membrane is not perforated, erythematous, retracted or bulging.     Nose: Nose normal. No congestion.     Mouth/Throat:     Mouth: Mucous membranes are moist.      Pharynx: Uvula midline. No oropharyngeal exudate or posterior oropharyngeal erythema.  Eyes:     Extraocular Movements: Extraocular movements intact.     Pupils: Pupils are equal, round, and reactive to light.  Cardiovascular:     Rate and Rhythm: Normal rate and regular rhythm.     Heart sounds: Normal heart sounds.  Pulmonary:     Effort: Pulmonary effort is normal.     Breath sounds: Normal breath sounds. No decreased breath sounds, wheezing, rhonchi or rales.  Chest:     Chest wall: Tenderness present.     Comments: TTP sternum Abdominal:     Palpations: Abdomen is soft.     Tenderness: There is no abdominal tenderness. There is no guarding or rebound.  Lymphadenopathy:     Cervical: Cervical adenopathy present.     Right cervical: Superficial cervical adenopathy present.     Left cervical: Superficial cervical adenopathy present.  Neurological:     General: No focal deficit present.     Mental Status: She is alert and oriented to person, place, and time.  Psychiatric:        Mood and Affect: Mood normal.        Behavior: Behavior normal.        Thought Content: Thought content normal.  Judgment: Judgment normal.     UC Treatments / Results  Labs (all labs ordered are listed, but only abnormal results are displayed) Labs Reviewed  CULTURE, GROUP A STREP ([place])  SARS CORONAVIRUS 2 (TAT 6-24 HRS)  POCT RAPID STREP A, ED / UC  POC INFLUENZA A AND B ANTIGEN (URGENT CARE ONLY)    EKG   Radiology No results found.  Procedures Procedures (including critical care time)  Medications Ordered in UC Medications  acetaminophen (TYLENOL) tablet 650 mg (650 mg Oral Given 11/28/20 1048)    Initial Impression / Assessment and Plan / UC Course  I have reviewed the triage vital signs and the nursing notes.  Pertinent labs & imaging results that were available during my care of the patient were reviewed by me and considered in my medical decision making (see chart for  details).     This patient is a very pleasant 55 y.o. year old female presenting with febrile illness. Febrile at 102.1, nontachycardic, oxygenating well on room air. Denies history pulm ds. Exposure to covid at work  Rapid strep negative, culture sent. Covid PCR sent Rapid influenza negative   Promethazine DM, tessalon.   Work note provided. ED return precautions discussed. Patient verbalizes understanding and agreement.   Level 4 for acute illness with systemic symptoms and prescription drug management.   Final Clinical Impressions(s) / UC Diagnoses   Final diagnoses:  Febrile illness  Encounter for screening for COVID-19  Screening for streptococcal infection     Discharge Instructions      -Promethazine DM cough syrup for congestion/cough. This could make you drowsy, so take at night before bed. -Tessalon (Benzonatate) as needed for cough. Take one pill up to 3x daily (every 8 hours) -Over-the-counter medications for additional relief (mucinex, dayquil, nyquil, etc) -For fevers/chills, bodyaches, headaches- -You can take Tylenol up to 1000 mg 3 times daily, and ibuprofen up to 600 mg 3 times daily with food.  You can take these together, or alternate every 3-4 hours. -Drink plenty of fluids and get plenty of rest -With a virus, you're typically contagious for 5-7 days, or as long as you're having fevers.      ED Prescriptions     Medication Sig Dispense Auth. Provider   benzonatate (TESSALON) 100 MG capsule Take 1 capsule (100 mg total) by mouth every 8 (eight) hours. 21 capsule Hazel Sams, PA-C   promethazine-dextromethorphan (PROMETHAZINE-DM) 6.25-15 MG/5ML syrup Take 5 mLs by mouth 4 (four) times daily as needed for cough. 118 mL Hazel Sams, PA-C      PDMP not reviewed this encounter.   Hazel Sams, PA-C 11/28/20 1128

## 2020-11-30 LAB — CULTURE, GROUP A STREP (THRC)

## 2020-12-01 DIAGNOSIS — G4733 Obstructive sleep apnea (adult) (pediatric): Secondary | ICD-10-CM | POA: Diagnosis not present

## 2021-01-01 DIAGNOSIS — G4733 Obstructive sleep apnea (adult) (pediatric): Secondary | ICD-10-CM | POA: Diagnosis not present

## 2021-01-11 ENCOUNTER — Ambulatory Visit
Admission: RE | Admit: 2021-01-11 | Discharge: 2021-01-11 | Disposition: A | Discharge status: Home / Self Care | Payer: BC Managed Care – PPO | Source: Ambulatory Visit | Attending: Family Medicine | Admitting: Family Medicine

## 2021-01-11 ENCOUNTER — Other Ambulatory Visit: Payer: Self-pay

## 2021-01-11 DIAGNOSIS — Z1231 Encounter for screening mammogram for malignant neoplasm of breast: Secondary | ICD-10-CM | POA: Diagnosis not present

## 2021-01-31 DIAGNOSIS — G4733 Obstructive sleep apnea (adult) (pediatric): Secondary | ICD-10-CM | POA: Diagnosis not present

## 2021-04-04 DIAGNOSIS — G4733 Obstructive sleep apnea (adult) (pediatric): Secondary | ICD-10-CM | POA: Diagnosis not present

## 2021-04-23 ENCOUNTER — Encounter: Payer: BC Managed Care – PPO | Admitting: Family Medicine

## 2021-04-25 ENCOUNTER — Encounter: Payer: Self-pay | Admitting: Family Medicine

## 2021-04-25 ENCOUNTER — Ambulatory Visit (INDEPENDENT_AMBULATORY_CARE_PROVIDER_SITE_OTHER): Payer: BC Managed Care – PPO | Admitting: Family Medicine

## 2021-04-25 VITALS — BP 110/82 | HR 77 | Temp 98.4°F | Resp 20 | Ht 65.0 in | Wt 278.7 lb

## 2021-04-25 DIAGNOSIS — Z0000 Encounter for general adult medical examination without abnormal findings: Secondary | ICD-10-CM

## 2021-04-25 DIAGNOSIS — Z91013 Allergy to seafood: Secondary | ICD-10-CM

## 2021-04-25 DIAGNOSIS — J4520 Mild intermittent asthma, uncomplicated: Secondary | ICD-10-CM

## 2021-04-25 DIAGNOSIS — R739 Hyperglycemia, unspecified: Secondary | ICD-10-CM

## 2021-04-25 DIAGNOSIS — I10 Essential (primary) hypertension: Secondary | ICD-10-CM

## 2021-04-25 DIAGNOSIS — B009 Herpesviral infection, unspecified: Secondary | ICD-10-CM | POA: Diagnosis not present

## 2021-04-25 LAB — COMPREHENSIVE METABOLIC PANEL
ALT: 10 U/L (ref 0–35)
AST: 10 U/L (ref 0–37)
Albumin: 4.1 g/dL (ref 3.5–5.2)
Alkaline Phosphatase: 55 U/L (ref 39–117)
BUN: 12 mg/dL (ref 6–23)
CO2: 30 mEq/L (ref 19–32)
Calcium: 9.9 mg/dL (ref 8.4–10.5)
Chloride: 103 mEq/L (ref 96–112)
Creatinine, Ser: 0.68 mg/dL (ref 0.40–1.20)
GFR: 98.08 mL/min (ref >60.00)
Glucose, Bld: 105 mg/dL — ABNORMAL HIGH (ref 70–99)
Potassium: 3.7 mEq/L (ref 3.5–5.1)
Sodium: 141 mEq/L (ref 135–145)
Total Bilirubin: 0.6 mg/dL (ref 0.2–1.2)
Total Protein: 7.2 g/dL (ref 6.0–8.3)

## 2021-04-25 LAB — LIPID PANEL
Cholesterol: 233 mg/dL — ABNORMAL HIGH (ref 0–200)
HDL: 44.8 mg/dL (ref >39.00)
LDL Cholesterol: 159 mg/dL — ABNORMAL HIGH (ref 0–99)
NonHDL: 188.16
Total CHOL/HDL Ratio: 5
Triglycerides: 148 mg/dL (ref 0.0–149.0)
VLDL: 29.6 mg/dL (ref 0.0–40.0)

## 2021-04-25 LAB — HEMOGLOBIN A1C: Hgb A1c MFr Bld: 5.6 % (ref 4.6–6.5)

## 2021-04-25 LAB — CBC WITH DIFFERENTIAL/PLATELET
Basophils Absolute: 0 10*3/uL (ref 0.0–0.1)
Basophils Relative: 0.5 % (ref 0.0–3.0)
Eosinophils Absolute: 0.1 10*3/uL (ref 0.0–0.7)
Eosinophils Relative: 2.3 % (ref 0.0–5.0)
HCT: 40.2 % (ref 36.0–46.0)
Hemoglobin: 13.1 g/dL (ref 12.0–15.0)
Lymphocytes Relative: 41 % (ref 12.0–46.0)
Lymphs Abs: 2.2 10*3/uL (ref 0.7–4.0)
MCHC: 32.6 g/dL (ref 30.0–36.0)
MCV: 95.3 fl (ref 78.0–100.0)
Monocytes Absolute: 0.4 10*3/uL (ref 0.1–1.0)
Monocytes Relative: 7.8 % (ref 3.0–12.0)
Neutro Abs: 2.6 10*3/uL (ref 1.4–7.7)
Neutrophils Relative %: 48.4 % (ref 43.0–77.0)
Platelets: 286 10*3/uL (ref 150.0–400.0)
RBC: 4.22 Mil/uL (ref 3.87–5.11)
RDW: 14.1 % (ref 11.5–15.5)
WBC: 5.4 10*3/uL (ref 4.0–10.5)

## 2021-04-25 LAB — TSH: TSH: 1.44 u[IU]/mL (ref 0.35–5.50)

## 2021-04-25 LAB — VITAMIN D 25 HYDROXY (VIT D DEFICIENCY, FRACTURES): VITD: 25.3 ng/mL — ABNORMAL LOW (ref 30.00–100.00)

## 2021-04-25 MED ORDER — ACYCLOVIR 5 % EX OINT: TOPICAL_OINTMENT | CUTANEOUS | 2 refills | Status: DC

## 2021-04-25 MED ORDER — ALBUTEROL SULFATE HFA 108 (90 BASE) MCG/ACT IN AERS: 2.0000 | INHALATION_SPRAY | Freq: Four times a day (QID) | RESPIRATORY_TRACT | 2 refills | Status: DC | PRN

## 2021-04-25 MED ORDER — BENAZEPRIL HCL 10 MG PO TABS: 10.0000 mg | ORAL_TABLET | Freq: Every day | ORAL | 1 refills | Status: DC

## 2021-04-25 MED ORDER — HYDROCHLOROTHIAZIDE 25 MG PO TABS: 25.0000 mg | ORAL_TABLET | Freq: Every day | ORAL | 1 refills | Status: DC

## 2021-04-25 NOTE — Patient Instructions (Signed)

## 2021-04-25 NOTE — Progress Notes (Signed)
Subjective:     Tiffany Gallagher is a 56 y.o. female and is here for a comprehensive physical exam. The patient reports no problems.  Social History   Socioeconomic History   Marital status: Divorced    Spouse name: Not on file   Number of children: 1   Years of education: 17   Highest education level: Not on file  Occupational History   Occupation: customer service   Tobacco Use   Smoking status: Every Day    Packs/day: 0.50    Years: 32.00    Pack years: 16.00    Types: Cigarettes   Smokeless tobacco: Never   Tobacco comments:    cutting back  Vaping Use   Vaping Use: Never used  Substance and Sexual Activity   Alcohol use: Yes    Alcohol/week: 0.0 standard drinks    Comment: rarely; once yearly   Drug use: No   Sexual activity: Yes    Partners: Male    Birth control/protection: Surgical, Condom  Other Topics Concern   Not on file  Social History Narrative   Exercise-- just starting to walk 4 -7 days a week    Social Determinants of Health   Financial Resource Strain: Not on file  Food Insecurity: Not on file  Transportation Needs: Not on file  Physical Activity: Not on file  Stress: Not on file  Social Connections: Not on file  Intimate Partner Violence: Not on file   Health Maintenance  Topic Date Due   Zoster Vaccines- Shingrix (1 of 2) Never done   COVID-19 Vaccine (3 - Booster for Moderna series) 05/11/2021 (Originally 07/28/2020)   INFLUENZA VACCINE  07/19/2021 (Originally 11/19/2020)   Pneumococcal Vaccine 86-54 Years old (1 - PCV) 04/25/2022 (Originally 12/14/1971)   MAMMOGRAM  01/11/2022   COLONOSCOPY (Pts 45-42yrs Insurance coverage will need to be confirmed)  05/19/2026   TETANUS/TDAP  03/15/2028   Hepatitis C Screening  Completed   HIV Screening  Completed   HPV VACCINES  Aged Out    The following portions of the patient's history were reviewed and updated as appropriate: She  has a past medical history of Allergy, Diverticulitis, Fibroid,  Hyperlipidemia, Hypertension, Migraines, Obesity, morbid, BMI 40.0-49.9 ([place]), and Sleep apnea. She does not have any pertinent problems on file. She  has a past surgical history that includes Cesarean section (1995); Vaginal hysterectomy (1998); and bone spur. Her family history includes Aneurysm in her mother; Arthritis in her brother; Cancer (age of onset: 89) in her father; Coronary artery disease in her mother; Diabetes in her brother and sister; Heart disease in her mother; Hyperlipidemia in her sister; Hypertension in her brother, brother, mother, sister, and sister; Kidney disease (age of onset: 25) in her mother; Stroke in her mother. She  reports that she has been smoking cigarettes. She has a 16.00 pack-year smoking history. She has never used smokeless tobacco. She reports current alcohol use. She reports that she does not use drugs. She has a current medication list which includes the following prescription(s): acetaminophen, vitamin c, benzonatate, biotin, calcium, fluticasone, multivitamin with minerals, omega-3 fatty acids, orlistat, promethazine-dextromethorphan, thiamine, tramadol, vitamin d, vitamin d (ergocalciferol), acyclovir ointment, albuterol, benazepril, hydrochlorothiazide, and omeprazole. Current Outpatient Medications on File Prior to Visit  Medication Sig Dispense Refill   acetaminophen (TYLENOL) 325 MG tablet Take 650 mg by mouth every 6 (six) hours as needed.     Ascorbic Acid (VITAMIN C) 100 MG tablet Take 100 mg by mouth daily.  benzonatate (TESSALON) 100 MG capsule Take 1 capsule (100 mg total) by mouth every 8 (eight) hours. 21 capsule 0   Biotin 10 MG CAPS Take by mouth.     CALCIUM PO Take 1 tablet by mouth daily.     fluticasone (FLONASE) 50 MCG/ACT nasal spray Place 2 sprays into both nostrils daily. 16 g 6   Multiple Vitamin (MULTIVITAMIN WITH MINERALS) TABS Take 1 tablet by mouth daily.     Omega-3 Fatty Acids (FISH OIL PO) Take by mouth.     Orlistat  (ALLI PO) Take 1 capsule by mouth daily.     promethazine-dextromethorphan (PROMETHAZINE-DM) 6.25-15 MG/5ML syrup Take 5 mLs by mouth 4 (four) times daily as needed for cough. 118 mL 0   thiamine (VITAMIN B-1) 100 MG tablet Take 100 mg by mouth daily.     traMADol (ULTRAM) 50 MG tablet Take 50 mg by mouth every 6 (six) hours as needed.     VITAMIN D PO Take 4,000 Units by mouth daily.     Vitamin D, Ergocalciferol, (DRISDOL) 1.25 MG (50000 UT) CAPS capsule Take 1 capsule (50,000 Units total) by mouth every 7 (seven) days. 12 capsule 0   omeprazole (PRILOSEC) 20 MG capsule Take 1 capsule (20 mg total) by mouth daily. 90 capsule 3   No current facility-administered medications on file prior to visit.   She is allergic to eggs or egg-derived products, lactose intolerance (gi), valtrex [valacyclovir], and lipitor [atorvastatin calcium]..  Review of Systems Review of Systems  Constitutional: Negative for activity change, appetite change and fatigue.  HENT: Negative for hearing loss, congestion, tinnitus and ear discharge.  dentist q27m Eyes: Negative for visual disturbance (see optho q1y -- vision corrected to 20/20 with glasses).  Respiratory: Negative for cough, chest tightness and shortness of breath.   Cardiovascular: Negative for chest pain, palpitations and leg swelling.  Gastrointestinal: Negative for abdominal pain, diarrhea, constipation and abdominal distention.  Genitourinary: Negative for urgency, frequency, decreased urine volume and difficulty urinating.  Musculoskeletal: Negative for back pain, arthralgias and gait problem.  Skin: Negative for color change, pallor and rash.  Neurological: Negative for dizziness, light-headedness, numbness and headaches.  Hematological: Negative for adenopathy. Does not bruise/bleed easily.  Psychiatric/Behavioral: Negative for suicidal ideas, confusion, sleep disturbance, self-injury, dysphoric mood, decreased concentration and agitation.       Objective:    BP 110/82 (BP Location: Right Arm, Patient Position: Sitting, Cuff Size: Large)    Pulse 77    Temp 98.4 F (36.9 C) (Oral)    Resp 20    Ht 5\' 5"  (1.651 m)    Wt 278 lb 11.2 oz (126.4 kg)    SpO2 97%    BMI 46.38 kg/m  General appearance: alert, cooperative, appears stated age, and no distress Head: Normocephalic, without obvious abnormality, atraumatic Eyes: conjunctivae/corneas clear. PERRL, EOM's intact. Fundi benign. Ears: normal TM's and external ear canals both ears Nose: Nares normal. Septum midline. Mucosa normal. No drainage or sinus tenderness. Throat: lips, mucosa, and tongue normal; teeth and gums normal Neck: no adenopathy, no carotid bruit, no JVD, supple, symmetrical, trachea midline, and thyroid not enlarged, symmetric, no tenderness/mass/nodules Back: symmetric, no curvature. ROM normal. No CVA tenderness. Lungs: clear to auscultation bilaterally Heart: regular rate and rhythm, S1, S2 normal, no murmur, click, rub or gallop Abdomen: soft, non-tender; bowel sounds normal; no masses,  no organomegaly Extremities: extremities normal, atraumatic, no cyanosis or edema Pulses: 2+ and symmetric Skin: Skin color, texture, turgor normal. No rashes  or lesions Lymph nodes: Cervical, supraclavicular, and axillary nodes normal. Neurologic: Alert and oriented X 3, normal strength and tone. Normal symmetric reflexes. Normal coordination and gait    Assessment:    Healthy female exam.      Plan:    Ghm utd Check labs  See After Visit Summary for Counseling Recommendations    1. HSV-2 infection Stable  Refill meds  - acyclovir ointment (ZOVIRAX) 5 %; Apply tid prn  Dispense: 30 g; Refill: 2  2. Essential hypertension Well controlled, no changes to meds. Encouraged heart healthy diet such as the DASH diet and exercise as tolerated.   - benazepril (LOTENSIN) 10 MG tablet; Take 1 tablet (10 mg total) by mouth daily.  Dispense: 90 tablet; Refill: 1 -  hydrochlorothiazide (HYDRODIURIL) 25 MG tablet; Take 1 tablet (25 mg total) by mouth daily.  Dispense: 90 tablet; Refill: 1 - TSH - CBC with Differential/Platelet - Comprehensive metabolic panel - Lipid panel - VITAMIN D 25 Hydroxy (Vit-D Deficiency, Fractures)  3. Preventative health care See above Did discuss shingrix with the pt-- she will think about it  - TSH - CBC with Differential/Platelet - Comprehensive metabolic panel - Lipid panel - VITAMIN D 25 Hydroxy (Vit-D Deficiency, Fractures)  4. Mild intermittent asthma without complication Stable Refill meds  - albuterol (VENTOLIN HFA) 108 (90 Base) MCG/ACT inhaler; Inhale 2 puffs into the lungs every 6 (six) hours as needed for wheezing or shortness of breath.  Dispense: 18 g; Refill: 2  5. Morbid obesity ([place]) D/w pt diet and exercise She will think about HWW - Amb Ref to Medical Weight Management - Insulin, random - Hemoglobin A1c  6. Seafood allergy She wants to hold off on testing for now  7. Hyperglycemia Check labs  - Hemoglobin A1c

## 2021-04-26 LAB — INSULIN, RANDOM: Insulin: 15.7 u[IU]/mL

## 2021-05-01 ENCOUNTER — Other Ambulatory Visit: Payer: Self-pay | Admitting: Family Medicine

## 2021-05-01 ENCOUNTER — Telehealth: Payer: Self-pay | Admitting: Family Medicine

## 2021-05-01 DIAGNOSIS — E785 Hyperlipidemia, unspecified: Secondary | ICD-10-CM

## 2021-05-01 NOTE — Telephone Encounter (Signed)
opened in error

## 2021-05-05 DIAGNOSIS — G4733 Obstructive sleep apnea (adult) (pediatric): Secondary | ICD-10-CM | POA: Diagnosis not present

## 2021-06-05 DIAGNOSIS — G4733 Obstructive sleep apnea (adult) (pediatric): Secondary | ICD-10-CM | POA: Diagnosis not present

## 2021-07-02 DIAGNOSIS — G4733 Obstructive sleep apnea (adult) (pediatric): Secondary | ICD-10-CM | POA: Diagnosis not present

## 2021-07-08 NOTE — Progress Notes (Signed)
? ?  Tiffany Gallagher 03/21/1966 [id number] ? ? ?History:  56 y.o. G2P1011 presents for annual exam. Postmenopausal - no HRT. S/P 1998 TVH for fibroids. Normal pap history. History of HSV-1 with occasional genital and oral outbreaks. She tried Valtrex daily in the past but had itching with continuous use. She does use acyclovir ointment but insurance no longer covers. HTN, HLD managed by PCP. Smoker.  ? ?Gynecologic History ?No LMP recorded. Patient has had a hysterectomy. ?  ?Contraception/Family planning: status post hysterectomy ?Sexually active: No ? ?Health Maintenance ?Last Pap: 06/12/2010. Results were: Normal ?Last mammogram: 01/11/2021. Results were: Normal ?Last colonoscopy: 05/19/2016. Results were: Benign polyps, 10-year recall ?Last Dexa: 4-5 years ago. Results were: Normal (managed by PCP) ? ?Past medical history, past surgical history, family history and social history were all reviewed and documented in the EPIC chart. Works in [employer]. Son recently moved in with her.  ? ?ROS:  A ROS was performed and pertinent positives and negatives are included. ? ?Exam: ? ?Vitals:  ? 07/09/21 0827  ?BP: 128/84  ?Weight: 280 lb (127 kg)  ?Height: '5\' 5"'$  (1.651 m)  ? ? ?Body mass index is 46.59 kg/m?. ? ?General appearance:  Normal ?Thyroid:  Symmetrical, normal in size, without palpable masses or nodularity. ?Respiratory ? Auscultation:  Clear without wheezing or rhonchi ?Cardiovascular ? Auscultation:  Regular rate, without rubs, murmurs or gallops ? Edema/varicosities:  Not grossly evident ?Abdominal ? Soft,nontender, without masses, guarding or rebound. ? Liver/spleen:  No organomegaly noted ? Hernia:  None appreciated ? Skin ? Inspection:  Grossly normal ?  ?Breasts: Examined lying and sitting.  ? Right: Without masses, retractions, discharge or axillary adenopathy. ? ? Left: Without masses, retractions, discharge or axillary adenopathy. ?Gentitourinary  ? Inguinal/mons:  Normal without inguinal  adenopathy ? External genitalia:  Mild erythema, slight hypopigmentation along bilateral groin and along perineum  ? BUS/Urethra/Skene's glands:  Normal ? Vagina:  Normal ? Cervix:  Absent ? Uterus:  Absent ? Adnexa/parametria:   ?  Rt: Without masses or tenderness. ?  Lt: Without masses or tenderness. ? Anus and perineum: Mild erythema, slight hypopigmentation ? Digital rectal exam: Normal sphincter tone without palpated masses or tenderness ? ?Assessment/Plan:  56 y.o. G2P1011 for annual exam.  ? ?Well female exam with routine gynecological exam - Education provided on SBEs, importance of preventative screenings, current guidelines, high calcium diet, regular exercise, and multivitamin daily. Labs with PCP.  ? ?HSV (herpes simplex virus) anogenital infection - Has itching with extended use of Valacyclovir. Prefers to use topical ointment. Uses Acyclovir but insurance no longer covers. Had about 3 outbreaks this year. Recommend [facility] or trying oral Acyclovir.  ? ?Dermatitis - Plan: clobetasol ointment (TEMOVATE) 0.05 % BID x 7-10 days. Apply externally to groin and perineum.  ? ?Screening for cervical cancer - Normal Pap history.  No longer screening per guidelines.  ? ?Screening for breast cancer - Normal mammogram history. Continue annual screenings.  Normal breast exam today. ? ?Screening for colon cancer - 2018 colonoscopy. Will repeat at GI's recommended interval.  ? ?Screening for osteoporosis - Normal bone density around 5 years ago per patient. Managed by PCP.  ? ?Return in 1 year for annual.  ? ? ? ? ? ? ? ? ? ? ? ? ? ? ? ? ? ?Tamela Gammon DNP, 9:36 AM 07/09/2021 ? ?

## 2021-07-09 ENCOUNTER — Other Ambulatory Visit: Payer: Self-pay | Admitting: Nurse Practitioner

## 2021-07-09 ENCOUNTER — Other Ambulatory Visit: Payer: Self-pay

## 2021-07-09 ENCOUNTER — Telehealth: Payer: Self-pay

## 2021-07-09 ENCOUNTER — Encounter: Payer: Self-pay | Admitting: Nurse Practitioner

## 2021-07-09 ENCOUNTER — Ambulatory Visit (INDEPENDENT_AMBULATORY_CARE_PROVIDER_SITE_OTHER): Payer: BC Managed Care – PPO | Admitting: Nurse Practitioner

## 2021-07-09 VITALS — BP 128/84 | Ht 65.0 in | Wt 280.0 lb

## 2021-07-09 DIAGNOSIS — Z780 Asymptomatic menopausal state: Secondary | ICD-10-CM | POA: Diagnosis not present

## 2021-07-09 DIAGNOSIS — Z01419 Encounter for gynecological examination (general) (routine) without abnormal findings: Secondary | ICD-10-CM | POA: Diagnosis not present

## 2021-07-09 DIAGNOSIS — L309 Dermatitis, unspecified: Secondary | ICD-10-CM

## 2021-07-09 DIAGNOSIS — B009 Herpesviral infection, unspecified: Secondary | ICD-10-CM | POA: Diagnosis not present

## 2021-07-09 MED ORDER — CLOBETASOL PROPIONATE 0.05 % EX OINT: 1.0000 "application " | TOPICAL_OINTMENT | Freq: Two times a day (BID) | CUTANEOUS | 0 refills | Status: DC

## 2021-07-09 MED ORDER — ACYCLOVIR 5 % EX OINT: TOPICAL_OINTMENT | CUTANEOUS | 2 refills | Status: DC

## 2021-07-09 NOTE — Telephone Encounter (Signed)
Thank you for clarifying. Prescription sent to [facility] listed.  ?

## 2021-07-09 NOTE — Telephone Encounter (Signed)
Spoke with patient and read her TW's message.  She said she is certain she took both the oral Acyclovir and the Valacyclovir and had itching with both. She asked that TW send the Acyclovir ointment to her [place] (pharmacy is set) and she will use [facility] and get it there. ?

## 2021-07-09 NOTE — Telephone Encounter (Signed)
Tamela Gammon, NP  [facility] Triage ?Please let patient know the Acyclovir is the only topical ointment. She was using this before but insurance stopped covering. With good [facility] she can get for much cheaper at [facility] or [facility]. She could also try oral Acyclovir (she gets itching with Valacyclovir) to see if she tolerates this better. I am happy to send whichever she prefers.  ?

## 2021-07-25 ENCOUNTER — Encounter: Payer: Self-pay | Admitting: Family Medicine

## 2021-07-25 ENCOUNTER — Encounter: Payer: Self-pay | Admitting: Family

## 2021-07-25 ENCOUNTER — Telehealth (INDEPENDENT_AMBULATORY_CARE_PROVIDER_SITE_OTHER): Payer: BC Managed Care – PPO | Admitting: Family

## 2021-07-25 VITALS — Temp 100.3°F | Ht 65.0 in | Wt 280.0 lb

## 2021-07-25 DIAGNOSIS — U071 COVID-19: Secondary | ICD-10-CM | POA: Diagnosis not present

## 2021-07-25 MED ORDER — BENZONATATE 200 MG PO CAPS: 200.0000 mg | ORAL_CAPSULE | Freq: Three times a day (TID) | ORAL | 0 refills | Status: DC

## 2021-07-25 MED ORDER — NIRMATRELVIR/RITONAVIR (PAXLOVID)TABLET: 3.0000 | ORAL_TABLET | Freq: Two times a day (BID) | ORAL | 0 refills | Status: AC

## 2021-07-25 NOTE — Progress Notes (Signed)
? ? ?MyChart Video Visit ? ? ? ?Virtual Visit via Video Note  ? ?This visit type was conducted due to national recommendations for restrictions regarding the COVID-19 Pandemic (e.g. social distancing) in an effort to limit this patient's exposure and mitigate transmission in our community. This patient is at least at moderate risk for complications without adequate follow up. This format is felt to be most appropriate for this patient at this time. Physical exam was limited by quality of the video and audio technology used for the visit. CMA was able to get the patient set up on a video visit. ? ?Patient location: Home. Patient and provider in visit ?Provider location: Office ? ?I discussed the limitations of evaluation and management by telemedicine and the availability of in person appointments. The patient expressed understanding and agreed to proceed. ? ?Visit Date: 07/25/2021 ? ?Today's healthcare provider: Jeanie Sewer, NP  ? ? ? ?Subjective:  ? ? Patient ID: Tiffany Gallagher, female    DOB: [birth year], 56 y.o.   MRN: [id number] ? ?Chief Complaint  ?Patient presents with  ? Covid Positive  ?  Pt tested positive this morning  ? Headache  ?  Pt c/o headaches, body aches, fever, nasal congestion  ? Cough  ? ?HPI ?Upper Respiratory Infection: Symptoms include congestion, nasal congestion, non productive cough, sinus pressure, and sore throat.  ?Onset of symptoms was 1 day ago, gradually worsening since that time. She is drinking moderate amounts of fluids. Evaluation to date: none.  ?Treatment to date: none. ? ? ? ?Past Medical History:  ?Diagnosis Date  ? Allergy   ? Diverticulitis   ? Fibroid   ? Hyperlipidemia   ? Hypertension   ? Migraines   ? Obesity, morbid, BMI 40.0-49.9 ([place])   ? Sleep apnea   ? cpap  ? ? ?Past Surgical History:  ?Procedure Laterality Date  ? bone spur    ? removed  ? [place]  ? VAGINAL HYSTERECTOMY  1998  ? partial  ? ? ?Outpatient Medications Prior to Visit   ?Medication Sig Dispense Refill  ? acetaminophen (TYLENOL) 325 MG tablet Take 650 mg by mouth every 6 (six) hours as needed.    ? acyclovir ointment (ZOVIRAX) 5 % Apply tid prn 30 g 2  ? albuterol (VENTOLIN HFA) 108 (90 Base) MCG/ACT inhaler Inhale 2 puffs into the lungs every 6 (six) hours as needed for wheezing or shortness of breath. 18 g 2  ? Ascorbic Acid (VITAMIN C) 100 MG tablet Take 100 mg by mouth daily.    ? benazepril (LOTENSIN) 10 MG tablet Take 1 tablet (10 mg total) by mouth daily. 90 tablet 1  ? Biotin 10 MG CAPS Take by mouth.    ? CALCIUM PO Take 1 tablet by mouth daily.    ? clobetasol ointment (TEMOVATE) 4.09 % Apply 1 application. topically 2 (two) times daily. 30 g 0  ? fluticasone (FLONASE) 50 MCG/ACT nasal spray Place 2 sprays into both nostrils daily. 16 g 6  ? hydrochlorothiazide (HYDRODIURIL) 25 MG tablet Take 1 tablet (25 mg total) by mouth daily. 90 tablet 1  ? Multiple Vitamin (MULTIVITAMIN WITH MINERALS) TABS Take 1 tablet by mouth daily.    ? Omega-3 Fatty Acids (FISH OIL PO) Take by mouth.    ? Orlistat (ALLI PO) Take 1 capsule by mouth daily.    ? thiamine (VITAMIN B-1) 100 MG tablet Take 100 mg by mouth daily.    ? VITAMIN D PO  Take 4,000 Units by mouth daily.    ? benzonatate (TESSALON) 100 MG capsule Take 1 capsule (100 mg total) by mouth every 8 (eight) hours. 21 capsule 0  ? omeprazole (PRILOSEC) 20 MG capsule Take 1 capsule (20 mg total) by mouth daily. 90 capsule 3  ? promethazine-dextromethorphan (PROMETHAZINE-DM) 6.25-15 MG/5ML syrup Take 5 mLs by mouth 4 (four) times daily as needed for cough. (Patient not taking: Reported on 07/25/2021) 118 mL 0  ? traMADol (ULTRAM) 50 MG tablet Take 50 mg by mouth every 6 (six) hours as needed. (Patient not taking: Reported on 07/25/2021)    ? ?No facility-administered medications prior to visit.  ? ? ?Allergies  ?Allergen Reactions  ? Eggs Or Egg-Derived Products   ? Lactose Intolerance (Gi)   ? Valtrex [Valacyclovir]   ?  itching  ?  Lipitor [Atorvastatin Calcium]   ?  Nerves, muscle spasms, light stool  ? ? ? ?   ?Objective:  ?  ? ?Physical Exam ?Vitals and nursing note reviewed.  ?Constitutional:   ?   General: She is not in acute distress. ?   Appearance: Normal appearance.  ?HENT:  ?   Head: Normocephalic.  ?Pulmonary:  ?   Effort: No respiratory distress.  ?Musculoskeletal:  ?   Cervical back: Normal range of motion.  ?Skin: ?   General: Skin is dry.  ?   Coloration: Skin is not pale.  ?Neurological:  ?   Mental Status: She is alert and oriented to person, place, and time.  ?Psychiatric:     ?   Mood and Affect: Mood normal.  ? ?Temp 100.3 ?F (37.9 ?C) (Oral)   Ht '5\' 5"'$  (1.651 m)   Wt 280 lb (127 kg)   BMI 46.59 kg/m?  ? ?Wt Readings from Last 3 Encounters:  ?07/25/21 280 lb (127 kg)  ?07/09/21 280 lb (127 kg)  ?04/25/21 278 lb 11.2 oz (126.4 kg)  ? ? ?   ?Assessment & Plan:  ? ?Problem List Items Addressed This Visit   ?None ?Visit Diagnoses   ? ? COVID-19    -  Primary  ? Sending Paxlovid, pt advised of FDA label for emergency use, how to take, & SE. Advised of CDC guidelines for masking. Advised of safe practice guidelines. Encouraged to monitor & notify office of any worsening symptoms: increased shortness of breath, weakness, and signs of dehydration. Instructed to rest and hydrate well.   Also sent refill of Tessalon pearles for cough. ? ?Relevant Medications  ? benzonatate (TESSALON) 200 MG capsule  ? nirmatrelvir/ritonavir EUA (PAXLOVID) 20 x 150 MG & 10 x '100MG'$  TABS  ? ?  ? ?Meds ordered this encounter  ?Medications  ? benzonatate (TESSALON) 200 MG capsule  ?  Sig: Take 1 capsule (200 mg total) by mouth every 8 (eight) hours.  ?  Dispense:  21 capsule  ?  Refill:  0  ?  Order Specific Question:   Supervising Provider  ?  Answer:   ANDY, CAMILLE L [[id number]]  ? nirmatrelvir/ritonavir EUA (PAXLOVID) 20 x 150 MG & 10 x '100MG'$  TABS  ?  Sig: Take 3 tablets by mouth 2 (two) times daily for 5 days. (Take nirmatrelvir 150 mg two tablets  twice daily for 5 days and ritonavir 100 mg one tablet twice daily for 5 days) Patient GFR is 86  ?  Dispense:  30 tablet  ?  Refill:  0  ?  Order Specific Question:   Supervising Provider  ?  Answer:   ANDY, CAMILLE L [[id number]]  ? ? ?I discussed the assessment and treatment plan with the patient. The patient was provided an opportunity to ask questions and all were answered. The patient agreed with the plan and demonstrated an understanding of the instructions. ?  ?The patient was advised to call back or seek an in-person evaluation if the symptoms worsen or if the condition fails to improve as anticipated. ? ?I provided 21 minutes of face-to-face time during this encounter. ? ? ?Jeanie Sewer, NP ?[place] ?[phone] (phone) ?[phone] (fax) ? ?[facility]  ?

## 2021-07-26 ENCOUNTER — Encounter: Payer: Self-pay | Admitting: Family

## 2021-07-26 DIAGNOSIS — B3731 Acute candidiasis of vulva and vagina: Secondary | ICD-10-CM

## 2021-07-31 MED ORDER — FLUCONAZOLE 150 MG PO TABS: ORAL_TABLET | ORAL | 0 refills | Status: DC

## 2021-11-10 ENCOUNTER — Ambulatory Visit (HOSPITAL_COMMUNITY)
Admission: EM | Admit: 2021-11-10 | Discharge: 2021-11-10 | Disposition: A | Discharge status: Home / Self Care | Payer: BC Managed Care – PPO | Attending: Internal Medicine | Admitting: Internal Medicine

## 2021-11-10 ENCOUNTER — Encounter (HOSPITAL_COMMUNITY): Payer: Self-pay

## 2021-11-10 DIAGNOSIS — J029 Acute pharyngitis, unspecified: Secondary | ICD-10-CM | POA: Diagnosis not present

## 2021-11-10 DIAGNOSIS — J069 Acute upper respiratory infection, unspecified: Secondary | ICD-10-CM | POA: Diagnosis not present

## 2021-11-10 MED ORDER — IBUPROFEN 600 MG PO TABS: 600.0000 mg | ORAL_TABLET | Freq: Four times a day (QID) | ORAL | 0 refills | Status: DC | PRN

## 2021-11-10 MED ORDER — ACETAMINOPHEN 500 MG PO TABS: 1000.0000 mg | ORAL_TABLET | Freq: Four times a day (QID) | ORAL | 0 refills | Status: DC | PRN

## 2021-11-10 MED ORDER — PROMETHAZINE-DM 6.25-15 MG/5ML PO SYRP: 5.0000 mL | ORAL_SOLUTION | Freq: Four times a day (QID) | ORAL | 0 refills | Status: DC | PRN

## 2021-11-10 MED ORDER — GUAIFENESIN ER 1200 MG PO TB12: 1200.0000 mg | ORAL_TABLET | Freq: Two times a day (BID) | ORAL | 0 refills | Status: DC

## 2021-11-10 NOTE — ED Provider Notes (Signed)
[place]    CSN: [id number] Arrival date & time: 11/10/21  1005      History   Chief Complaint Chief Complaint  Patient presents with   Sore Throat    HPI Tiffany Gallagher is a 56 y.o. female.   Patient presents to urgent care for evaluation of nasal congestion, sore throat, headache, fever/chills, and mild nausea over the last 4 days.  Patient symptoms started on Wednesday, November 06, 2021.  Patient to call out of work on Wednesday when her symptoms first started due to significant body aches and fever.  Patient states that her symptoms initially improved with over-the-counter cold and flu medications and she was able to attempt to go to work on Friday, November 08, 2021 but had to leave work early because of feeling ill.  Symptoms started out in her head with nasal congestion, ear fullness, and sore throat and have now progressed to a dry cough at nighttime.  Patient states that she wears a CPAP machine and has noticed white frothy sputum when she wakes up.  Cough is nagging and worse at nighttime.  Nasal mucus is thick and sometimes clear and sometimes yellow/green.  No known exposure to sick contacts.  Patient took a COVID-19 test at home which was negative.  She has been using over-the-counter cold and flu sinus medications as well as Mucinex extra strength and states that the Mucinex has been helping significantly to clear her mucus.  She has been using Advil as needed for fever, body aches, and chills with significant relief.  Last dose of anti-inflammatory medications was yesterday.  Denies history of respiratory problems other than chronic sleep apnea that is well controlled with CPAP.  Reports a small amount of nausea but no vomiting.  No abdominal pain, urinary symptoms, dizziness, blood in mucus or stool, diarrhea, and visual changes.  Reports a mild headache that is significantly improved by Advil.  No vision changes or dizziness reported.  No other aggravating or relieving  factors identified at this time for patient's symptoms.   Sore Throat    Past Medical History:  Diagnosis Date   Allergy    Diverticulitis    Fibroid    Hyperlipidemia    Hypertension    Migraines    Obesity, morbid, BMI 40.0-49.9 ([place])    Sleep apnea    cpap    Patient Active Problem List   Diagnosis Date Noted   History of total vaginal hysterectomy (TVH) 07/04/2020   Healthcare maintenance 04/26/2020   It band syndrome, right 04/24/2020   Hyperlipidemia 09/19/2019   Mild intermittent asthma without complication 78/58/8502   Hair loss 09/19/2019   Dyspepsia 09/19/2019   Right hip pain 09/19/2019   Leg skin lesion, right 06/09/2017   OSA (obstructive sleep apnea) 06/05/2016   Preventative health care 02/09/2015   Acute bacterial bronchitis 11/15/2014   Severe obesity (BMI >= 40) ([place]) 05/10/2014   Physical exam 01/20/2014   HSV-1 infection 06/20/2013   Migraines    Smoker    Sinusitis 04/11/2011   Leg cramps 09/18/2010   Edema 09/18/2010   VAGINAL DISCHARGE 09/20/2009   KNEE PAIN, BILATERAL 09/20/2009   FLATULENCE ERUCTATION AND GAS PAIN 08/03/2009   HEMATURIA, HX OF 07/04/2009   Essential hypertension 02/07/2008   GERD 11/30/2006    Past Surgical History:  Procedure Laterality Date   bone spur     removed   [place]   partial  OB History     Gravida  2   Para  1   Term  1   Preterm      AB  1   Living  1      SAB      IAB      Ectopic      Multiple      Live Births               Home Medications    Prior to Admission medications   Medication Sig Start Date End Date Taking? Authorizing Provider  acetaminophen (TYLENOL) 500 MG tablet Take 2 tablets (1,000 mg total) by mouth every 6 (six) hours as needed. 11/10/21  Yes Talbot Grumbling, FNP  Guaifenesin 1200 MG TB12 Take 1 tablet (1,200 mg total) by mouth in the morning and at bedtime. 11/10/21  Yes Talbot Grumbling, FNP   ibuprofen (ADVIL) 600 MG tablet Take 1 tablet (600 mg total) by mouth every 6 (six) hours as needed. 11/10/21  Yes Talbot Grumbling, FNP  promethazine-dextromethorphan (PROMETHAZINE-DM) 6.25-15 MG/5ML syrup Take 5 mLs by mouth 4 (four) times daily as needed for cough. 11/10/21  Yes Talbot Grumbling, FNP  acyclovir ointment (ZOVIRAX) 5 % Apply tid prn 07/09/21   Marny Lowenstein A, NP  albuterol (VENTOLIN HFA) 108 (90 Base) MCG/ACT inhaler Inhale 2 puffs into the lungs every 6 (six) hours as needed for wheezing or shortness of breath. 04/25/21   Ann Held, DO  Ascorbic Acid (VITAMIN C) 100 MG tablet Take 100 mg by mouth daily.    [provider]  benazepril (LOTENSIN) 10 MG tablet Take 1 tablet (10 mg total) by mouth daily. 04/25/21   Ann Held, DO  Biotin 10 MG CAPS Take by mouth.    [provider]  CALCIUM PO Take 1 tablet by mouth daily.    [provider]  clobetasol ointment (TEMOVATE) 2.58 % Apply 1 application. topically 2 (two) times daily. 07/09/21   Tamela Gammon, NP  fluconazole (DIFLUCAN) 150 MG tablet Take 1 pill today and the 2nd pill in 3 days. 07/31/21   Jeanie Sewer, NP  fluticasone (FLONASE) 50 MCG/ACT nasal spray Place 2 sprays into both nostrils daily. 06/09/17   Ann Held, DO  hydrochlorothiazide (HYDRODIURIL) 25 MG tablet Take 1 tablet (25 mg total) by mouth daily. 04/25/21   Ann Held, DO  Multiple Vitamin (MULTIVITAMIN WITH MINERALS) TABS Take 1 tablet by mouth daily.    [provider]  Omega-3 Fatty Acids (FISH OIL PO) Take by mouth.    [provider]  omeprazole (PRILOSEC) 20 MG capsule Take 1 capsule (20 mg total) by mouth daily. 03/15/18 07/22/20  Ann Held, DO  Orlistat (ALLI PO) Take 1 capsule by mouth daily.    [provider]  thiamine (VITAMIN B-1) 100 MG tablet Take 100 mg by mouth daily.    [provider]  VITAMIN D PO Take 4,000  Units by mouth daily.    [provider]    Family History Family History  Problem Relation Age of Onset   Coronary artery disease Mother    Hypertension Mother    Kidney disease Mother 45       dialysis   Aneurysm Mother        brain   Stroke Mother    Heart disease Mother        MI   Cancer  Father 73       brain cancer   Diabetes Sister    Hyperlipidemia Sister    Hypertension Sister    Hypertension Sister    Arthritis Brother    Hypertension Brother    Diabetes Brother    Hypertension Brother    Heart attack Brother     Social History Social History   Tobacco Use   Smoking status: Every Day    Packs/day: 0.50    Years: 32.00    Total pack years: 16.00    Types: Cigarettes   Smokeless tobacco: Never   Tobacco comments:    cutting back  Vaping Use   Vaping Use: Never used  Substance Use Topics   Alcohol use: Yes    Comment: Occas wine   Drug use: Yes    Types: Marijuana    Comment: occ     Allergies   Eggs or egg-derived products, Lactose intolerance (gi), Valtrex [valacyclovir], and Lipitor [atorvastatin calcium]   Review of Systems Review of Systems Per HPI  Physical Exam Triage Vital Signs ED Triage Vitals [11/10/21 1034]  Enc Vitals Group     BP (!) 149/87     Pulse Rate 78     Resp 18     Temp 99.2 F (37.3 C)     Temp Source Oral     SpO2 95 %     Weight      Height      Head Circumference      Peak Flow      Pain Score      Pain Loc      Pain Edu?      Excl. in [place]?    No data found.  Updated Vital Signs BP (!) 149/87   Pulse 78   Temp 99.2 F (37.3 C) (Oral)   Resp 18   SpO2 95%   Visual Acuity Right Eye Distance:   Left Eye Distance:   Bilateral Distance:    Right Eye Near:   Left Eye Near:    Bilateral Near:     Physical Exam Vitals and nursing note reviewed.  Constitutional:      Appearance: Normal appearance. She is obese. She is not ill-appearing or toxic-appearing.     Comments: Very pleasant  patient sitting on exam in position of comfort table in no acute distress.   HENT:     Head: Normocephalic and atraumatic.     Right Ear: Hearing, tympanic membrane, ear canal and external ear normal. Tympanic membrane is not erythematous.     Left Ear: Hearing, tympanic membrane, ear canal and external ear normal. Tympanic membrane is not erythematous.     Nose: Congestion present.     Mouth/Throat:     Lips: Pink.     Mouth: Mucous membranes are moist.     Pharynx: Uvula midline. Posterior oropharyngeal erythema present.     Tonsils: No tonsillar exudate or tonsillar abscesses.     Comments: Mild erythema to posterior oropharynx with small amount of clear postnasal drainage visualized. Airway intact and patent. Eyes:     General: Lids are normal. Vision grossly intact. Gaze aligned appropriately.     Extraocular Movements: Extraocular movements intact.     Conjunctiva/sclera: Conjunctivae normal.  Cardiovascular:     Rate and Rhythm: Normal rate and regular rhythm.     Heart sounds: Normal heart sounds, S1 normal and S2 normal.  Pulmonary:     Effort: Pulmonary effort is normal. No respiratory  distress.     Breath sounds: Normal breath sounds and air entry.     Comments: Clear to auscultation bilaterally. Abdominal:     General: Bowel sounds are normal.     Palpations: Abdomen is soft.  Musculoskeletal:     Cervical back: Neck supple.  Skin:    General: Skin is warm and dry.     Capillary Refill: Capillary refill takes less than 2 seconds.     Findings: No rash.  Neurological:     General: No focal deficit present.     Mental Status: She is alert and oriented to person, place, and time. Mental status is at baseline.     Cranial Nerves: No dysarthria or facial asymmetry.     Motor: No weakness.     Gait: Gait is intact. Gait normal.  Psychiatric:        Mood and Affect: Mood normal.        Speech: Speech normal.        Behavior: Behavior normal.        Thought Content:  Thought content normal.        Judgment: Judgment normal.      UC Treatments / Results  Labs (all labs ordered are listed, but only abnormal results are displayed) Labs Reviewed - No data to display  EKG   Radiology No results found.  Procedures Procedures (including critical care time)  Medications Ordered in UC Medications - No data to display  Initial Impression / Assessment and Plan / UC Course  I have reviewed the triage vital signs and the nursing notes.  Pertinent labs & imaging results that were available during my care of the patient were reviewed by me and considered in my medical decision making (see chart for details).  Viral URI with cough Symptomology and physical exam are consistent with viral upper respiratory infection with cough.  We will manage this with supportive care prescriptions for symptomatic relief.  Deferred imaging based on stable cardiopulmonary exam and hemodynamically stable vital signs at this time.  Guaifenesin twice daily to thin mucus prescribed.  Encourage patient to increase water intake while taking this medication and while recovering from viral illness.  Promethazine DM prescribed for cough at nighttime and patient advised to not drink or drive while taking this medicine as it can make her sleepy.  Tylenol 1000 mg and ibuprofen 600 mg every 6 hours to be taken with food for fever/chills, sore throat, aches and pains, and fever/chills.  Nonpharmacologic methods of sore throat relief provided and after visit summary.    Patient declines PCR COVID-19 testing at this time.  Doubt streptococcal pharyngitis etiology of symptoms.  Discussed physical exam and available lab work findings in clinic with patient.  Counseled patient regarding appropriate use of medications and potential side effects for all medications recommended or prescribed today. Discussed red flag signs and symptoms of worsening condition,when to call the PCP office, return to  urgent care, and when to seek higher level of care in the emergency department. Patient verbalizes understanding and agreement with plan. All questions answered. Patient discharged in stable condition.   Final Clinical Impressions(s) / UC Diagnoses   Final diagnoses:  Viral URI with cough  Sore throat     Discharge Instructions      You have a viral upper respiratory infection.   Take guaifenesin '1200mg'$   2 times daily to thin your mucous so that you can cough it up and blow it out of your nose  easier. Drink plenty of water while taking this medication so that it works well in your body (at least 8 cups a day).   Take Promethazine DM cough medication to help with your cough at nighttime so that you are able to sleep. Do not drive, drink alcohol, or go to work while taking this medication since it can make you sleepy. Only take this at nighttime.   You may take tylenol 1,'000mg'$  and ibuprofen '600mg'$  every 6 hours with food as needed for fever/chills, sore throat, aches/pains, and inflammation associated with viral illness. Take this with food to avoid stomach upset.    Your next dose of tylenol may be when you get home.  Your next dose of ibuprofen may be when you get home.   You may do salt water and baking soda gargles every 4 hours as needed for your throat pain.  Please put 1 teaspoon of salt and 1/2 teaspoon of baking soda in 8 ounces of warm water then gargle and spit the water out. You may also put 1 tablespoon of honey in warm water and drink this to soothe your throat.  Place a humidifier in your room at night to help decrease dry air that can irritate your airway and cause you to have a sore throat and cough.  Please try to eat a well-balanced diet while you are sick so that your body gets proper nutrition to heal.  If you develop any new or worsening symptoms, please return.  If your symptoms are severe, please go to the emergency room.  Follow-up with your primary care provider for  further evaluation and management of your symptoms as well as ongoing wellness visits.  I hope you feel better!     ED Prescriptions     Medication Sig Dispense Auth. Provider   Guaifenesin 1200 MG TB12 Take 1 tablet (1,200 mg total) by mouth in the morning and at bedtime. 14 tablet Talbot Grumbling, FNP   promethazine-dextromethorphan (PROMETHAZINE-DM) 6.25-15 MG/5ML syrup Take 5 mLs by mouth 4 (four) times daily as needed for cough. 118 mL Joella Prince M, FNP   acetaminophen (TYLENOL) 500 MG tablet Take 2 tablets (1,000 mg total) by mouth every 6 (six) hours as needed. 30 tablet Joella Prince M, FNP   ibuprofen (ADVIL) 600 MG tablet Take 1 tablet (600 mg total) by mouth every 6 (six) hours as needed. 30 tablet Talbot Grumbling, FNP      PDMP not reviewed this encounter.   Talbot Grumbling, [place] 11/10/21 1121

## 2021-11-10 NOTE — ED Triage Notes (Signed)
Patient presents to Urgent Care with complaints of sore throat and head cold since 4 days ago. Patient reports cold and cough medicaytion otc.

## 2021-11-10 NOTE — Discharge Instructions (Addendum)
You have a viral upper respiratory infection.   Take guaifenesin '1200mg'$   2 times daily to thin your mucous so that you can cough it up and blow it out of your nose easier. Drink plenty of water while taking this medication so that it works well in your body (at least 8 cups a day).   Take Promethazine DM cough medication to help with your cough at nighttime so that you are able to sleep. Do not drive, drink alcohol, or go to work while taking this medication since it can make you sleepy. Only take this at nighttime.   You may take tylenol 1,'000mg'$  and ibuprofen '600mg'$  every 6 hours with food as needed for fever/chills, sore throat, aches/pains, and inflammation associated with viral illness. Take this with food to avoid stomach upset.    Your next dose of tylenol may be when you get home.  Your next dose of ibuprofen may be when you get home.   You may do salt water and baking soda gargles every 4 hours as needed for your throat pain.  Please put 1 teaspoon of salt and 1/2 teaspoon of baking soda in 8 ounces of warm water then gargle and spit the water out. You may also put 1 tablespoon of honey in warm water and drink this to soothe your throat.  Place a humidifier in your room at night to help decrease dry air that can irritate your airway and cause you to have a sore throat and cough.  Please try to eat a well-balanced diet while you are sick so that your body gets proper nutrition to heal.  If you develop any new or worsening symptoms, please return.  If your symptoms are severe, please go to the emergency room.  Follow-up with your primary care provider for further evaluation and management of your symptoms as well as ongoing wellness visits.  I hope you feel better!

## 2021-12-25 IMAGING — MG MM DIGITAL SCREENING BILAT W/ TOMO AND CAD
8 series · 8 of 24 positions shown · non-contrast
Comparison: Previous exam(s).

CLINICAL DATA: Screening.

EXAM:
DIGITAL SCREENING BILATERAL MAMMOGRAM WITH TOMOSYNTHESIS AND CAD
TECHNIQUE: Bilateral screening digital craniocaudal and mediolateral oblique
mammograms were obtained. Bilateral screening digital breast
tomosynthesis was performed. The images were evaluated with
computer-aided detection.

[R MLO synth-2D]
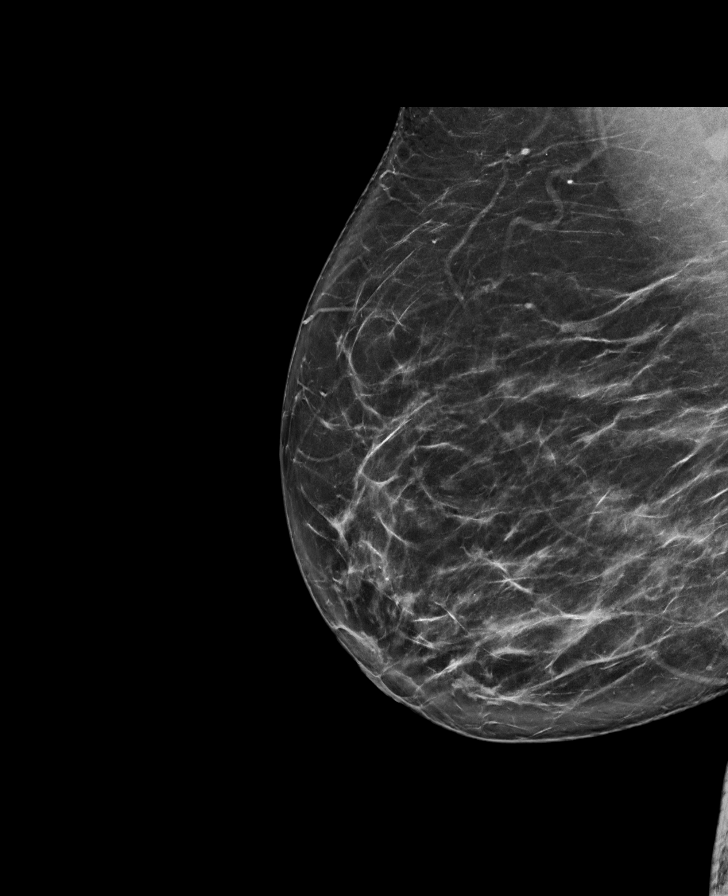

[L CC synth-2D]
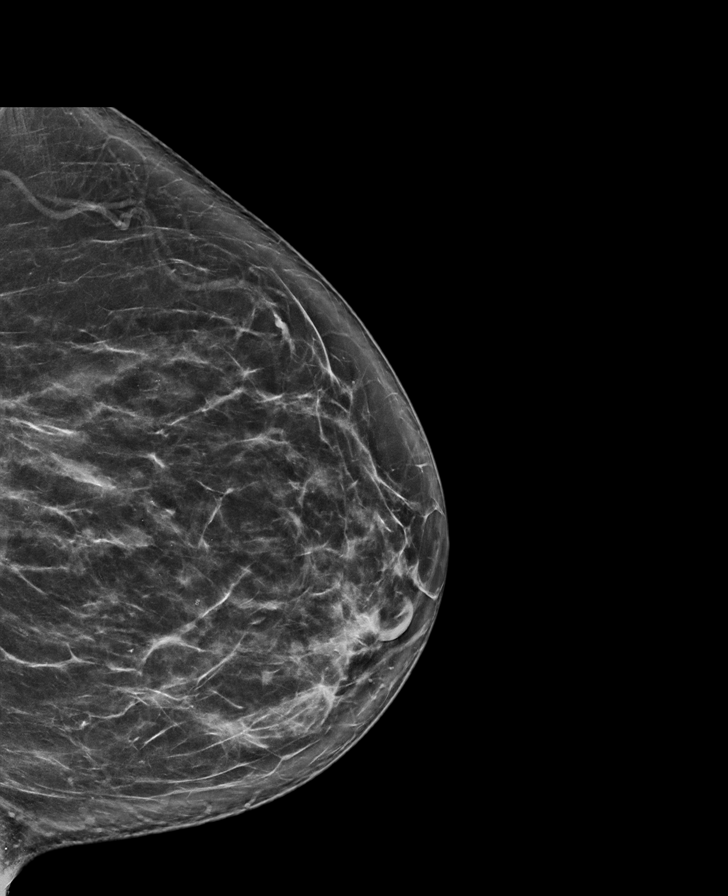

[L MLO synth-2D]
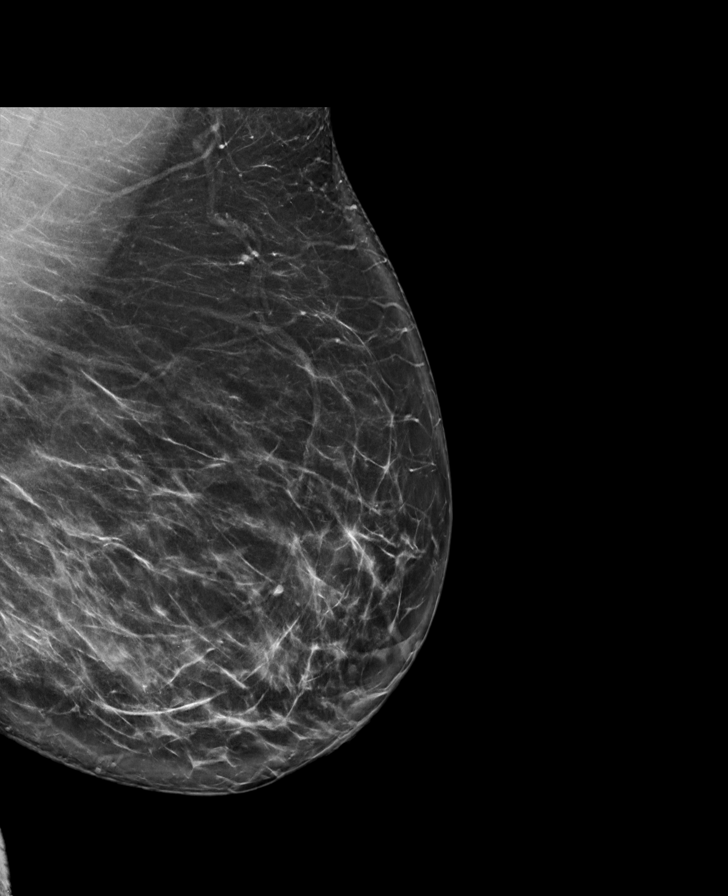

[R CC synth-2D]
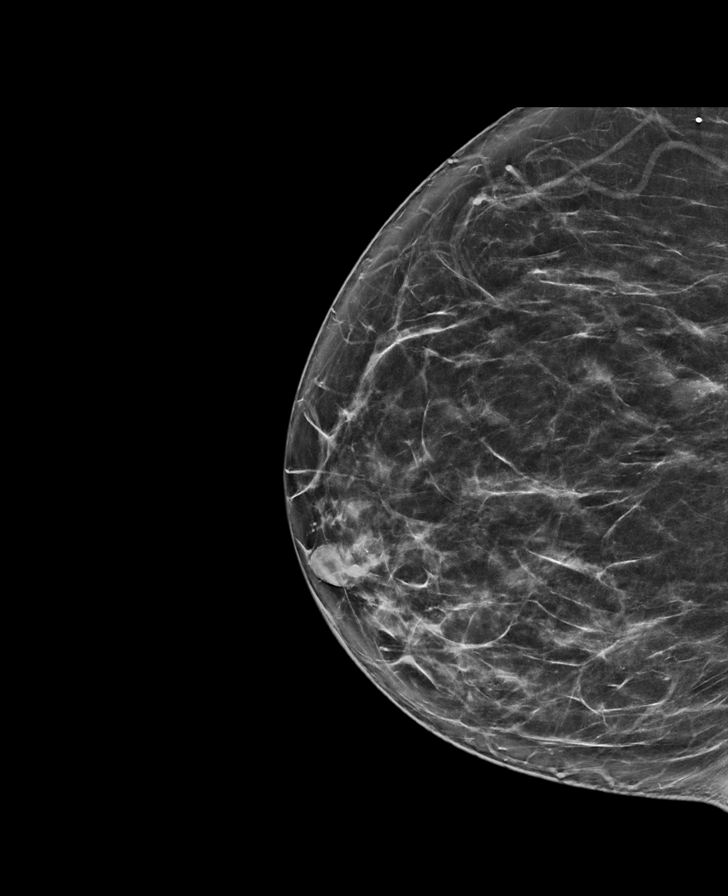

[R CC tomo · tomo slice 41/82.0]
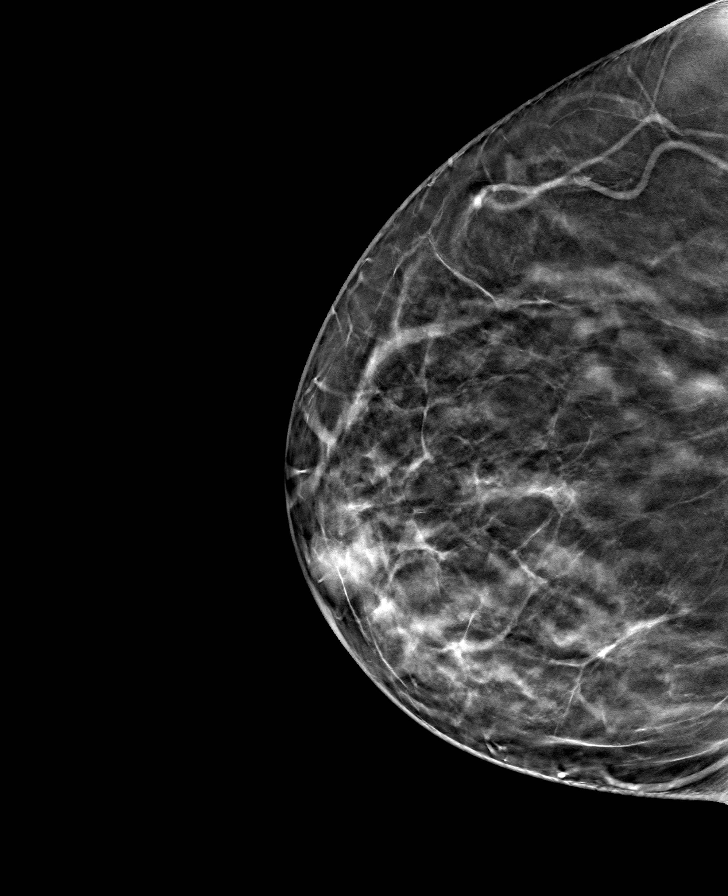

[L MLO tomo · tomo slice 48/95.0]
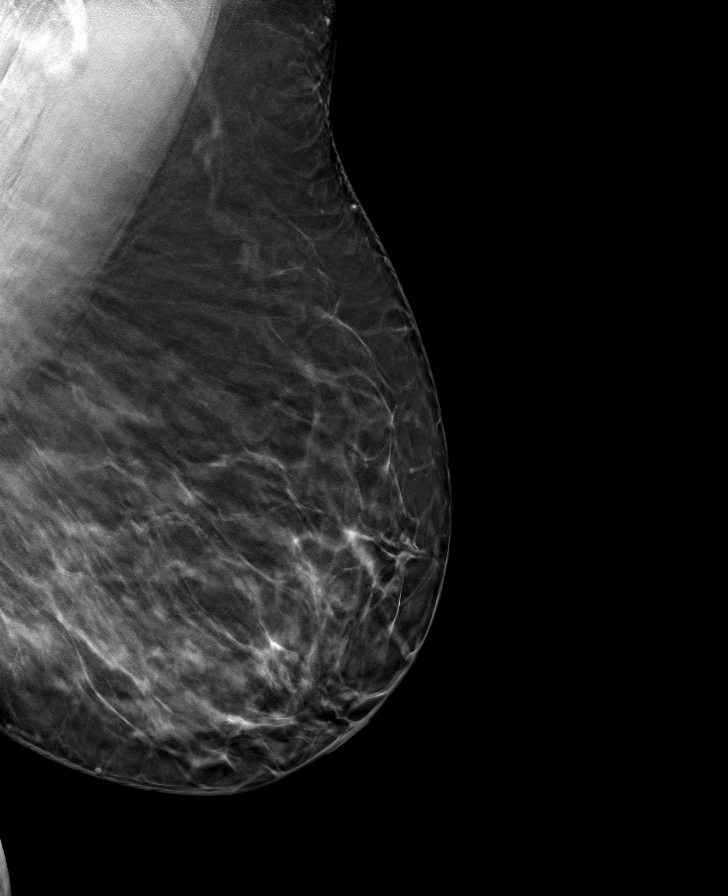

[R MLO tomo · tomo slice 46/91.0]
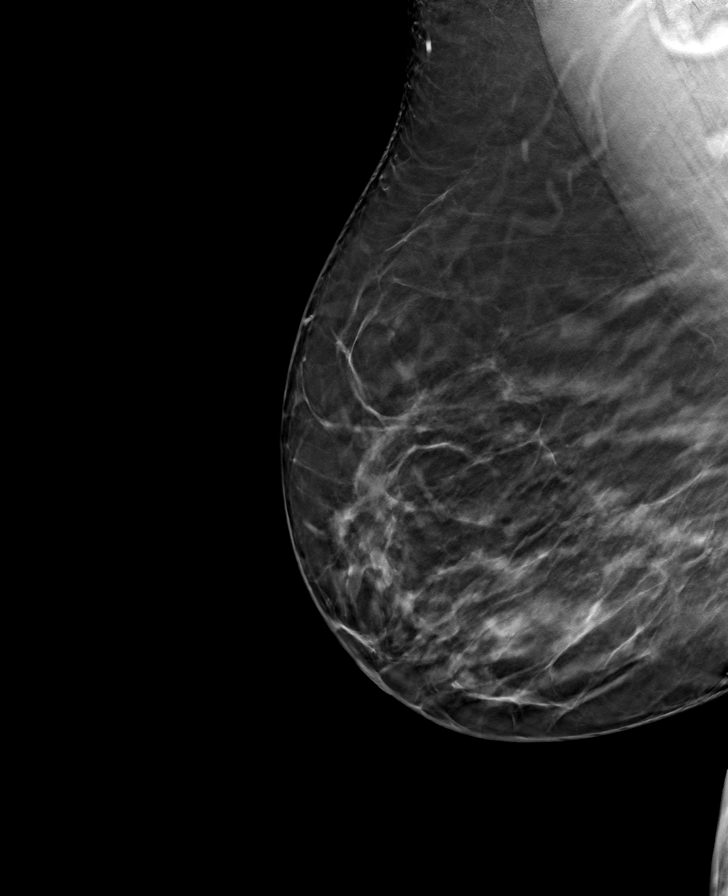

[L CC tomo · tomo slice 45/88.0]
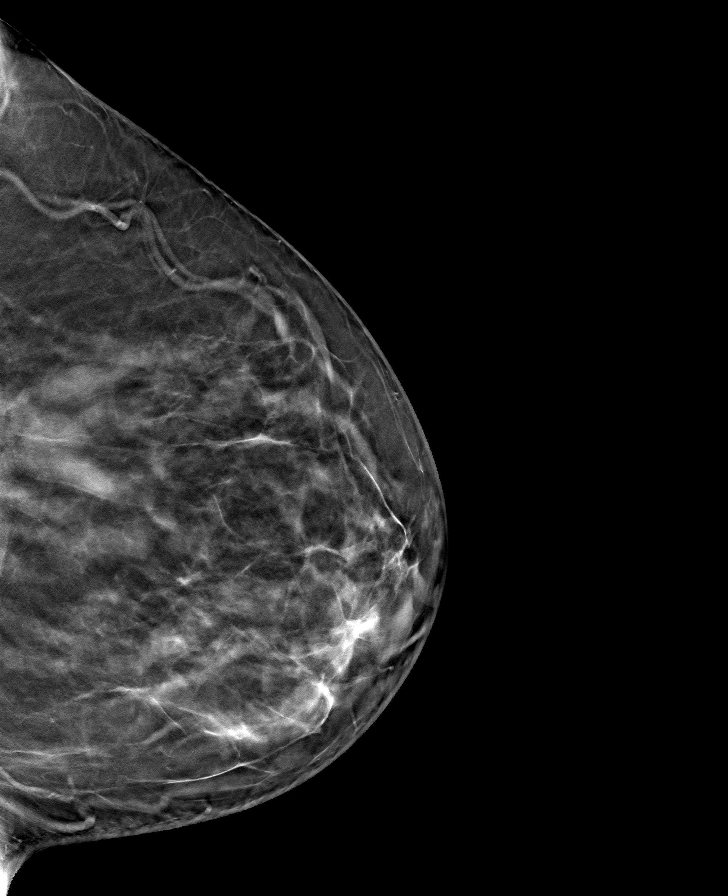

[8 of 24 positions shown; findings below may reference images not displayed]

ACR Breast Density Category b: There are scattered areas of
fibroglandular density.
FINDINGS: There are no findings suspicious for malignancy.
IMPRESSION: No mammographic evidence of malignancy. A result letter of this
screening mammogram will be mailed directly to the patient.

RECOMMENDATION:
Screening mammogram in one year. (Code:51-O-LD2)

BI-RADS CATEGORY  1: Negative.

## 2022-01-04 ENCOUNTER — Other Ambulatory Visit: Payer: Self-pay | Admitting: Family Medicine

## 2022-01-04 DIAGNOSIS — I10 Essential (primary) hypertension: Secondary | ICD-10-CM

## 2022-04-25 NOTE — Progress Notes (Signed)
Subjective:   By signing my name below, I, Tiffany Gallagher, attest that this documentation has been prepared under the direction and in the presence of Ann Held, DO 04/28/22   Patient ID: Tiffany Gallagher, female    DOB: 07/03/1965, 57 y.o.   MRN: [id number]  Chief Complaint  Patient presents with   Annual Exam    Pt states fasting     HPI Patient is in today for a comprehensive physical exam.   She states that she is doing well overall. 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 She has been there for over 30 years.  She reports that she has been compliant with her Benazepril '10mg'$   daily and HCTZ '25mg'$  daily. However, her blood pressures have been labile at home. She notes having a BP at home of 431 systolic associated with a severe headache since her last visit. She would like to check with her insurance company to see if she is BP Readings from Last 5 Encounters:  04/28/22 (!) 158/90  11/10/21 (!) 149/87  07/09/21 128/84  04/25/21 110/82  11/28/20 140/65     She has been active exercising regularly and doing yoga with relief of her stiff joints.  Last mammogram: 01/11/2021. Results were: negative for evidence of malignancy. She has not yet scheduled a repeat mammogram but states that she will try to get this scheduled this month.  Last Pap: 06/12/2010. Results were: Normal. She is s/p partial hysterectomy. She is followed by OBGYN with annual visits. She states that they no longer do pap smears but they do perform vaginal swabs.  Last colonoscopy: 05/19/2016. Results were: Benign polyps, 10-year recall recommended.  She is not interested in receiving her Shingrix vaccine today. She is otherwise UTD on her immunizations. Despite receiving the annual COVID booster she has had COVID twice.   Patient reports a  spot above her bra line in the middle of her back which she feels may be an area of eczema as it itches. However, she has been unable to look at it more closely. She has been applying a topical OTC eczema lotion on this area without much relief.  She denies having any fever, new muscle pain, new joint pain, new moles, congestion, sinus pain, sore throat, chest pain, palpations, cough, SOB, wheezing, n/v/d, constipation, blood in stool, dysuria, frequency, hematuria, or headaches at this time.   Past Medical History:  Diagnosis Date   Allergy    Diverticulitis    Fibroid    Hyperlipidemia    Hypertension    Migraines    Obesity, morbid, BMI 40.0-49.9 ([place])    Sleep apnea    cpap    Past Surgical History:  Procedure Laterality Date   bone spur     removed   [place]   partial    Family History  Problem Relation Age of Onset   Coronary artery disease Mother    Hypertension Mother    Kidney disease Mother 47       dialysis   Aneurysm Mother        brain   Stroke Mother    Heart disease Mother        MI   Cancer Father 37       brain  cancer   Diabetes Sister    Hyperlipidemia Sister    Hypertension Sister    Hypertension Sister    Arthritis Brother    Hypertension Brother    Diabetes Brother    Hypertension Brother    Heart attack Brother     Social History   Socioeconomic History   Marital status: Divorced    Spouse name: Not on file   Number of children: 1   Years of education: 17   Highest education level: Not on file  Occupational History   Occupation: customer service   Tobacco Use   Smoking status: Every Day    Packs/day: 0.50    Years: 32.00    Total pack years: 16.00    Types: Cigarettes   Smokeless tobacco: Never   Tobacco comments:    cutting back  Vaping Use   Vaping Use: Never used  Substance and Sexual Activity   Alcohol use: Yes    Comment: Occas wine   Drug use: Yes    Types: Marijuana     Comment: occ   Sexual activity: Not Currently    Partners: Male    Birth control/protection: Surgical, Condom  Other Topics Concern   Not on file  Social History Narrative   Exercise-- just starting to walk 4 -7 days a week , yoga    Social Determinants of Health   Financial Resource Strain: Not on file  Food Insecurity: Not on file  Transportation Needs: Not on file  Physical Activity: Not on file  Stress: Not on file  Social Connections: Not on file  Intimate Partner Violence: Not on file    Outpatient Medications Prior to Visit  Medication Sig Dispense Refill   acetaminophen (TYLENOL) 500 MG tablet Take 2 tablets (1,000 mg total) by mouth every 6 (six) hours as needed. 30 tablet 0   acyclovir ointment (ZOVIRAX) 5 % Apply tid prn 30 g 2   Ascorbic Acid (VITAMIN C) 100 MG tablet Take 100 mg by mouth daily.     Biotin 10 MG CAPS Take by mouth.     CALCIUM PO Take 1 tablet by mouth daily.     fluconazole (DIFLUCAN) 150 MG tablet Take 1 pill today and the 2nd pill in 3 days. 2 tablet 0   fluticasone (FLONASE) 50 MCG/ACT nasal spray Place 2 sprays into both nostrils daily. 16 g 6   Guaifenesin 1200 MG TB12 Take 1 tablet (1,200 mg total) by mouth in the morning and at bedtime. 14 tablet 0   ibuprofen (ADVIL) 600 MG tablet Take 1 tablet (600 mg total) by mouth every 6 (six) hours as needed. 30 tablet 0   Multiple Vitamin (MULTIVITAMIN WITH MINERALS) TABS Take 1 tablet by mouth daily.     Omega-3 Fatty Acids (FISH OIL PO) Take by mouth.     Orlistat (ALLI PO) Take 1 capsule by mouth daily.     thiamine (VITAMIN B-1) 100 MG tablet Take 100 mg by mouth daily.     VITAMIN D PO Take 4,000 Units by mouth daily.     albuterol (VENTOLIN HFA) 108 (90 Base) MCG/ACT inhaler Inhale 2 puffs into the lungs every 6 (six) hours as needed for wheezing or shortness of breath. 18 g 2   benazepril (LOTENSIN) 10 MG tablet Take 1 tablet (10 mg total) by mouth daily. 90 tablet 1   clobetasol ointment  (TEMOVATE) 2.40 % Apply 1 application. topically 2 (two) times daily. 30 g 0   hydrochlorothiazide (HYDRODIURIL)  25 MG tablet Take 1 tablet (25 mg total) by mouth daily. 90 tablet 1   promethazine-dextromethorphan (PROMETHAZINE-DM) 6.25-15 MG/5ML syrup Take 5 mLs by mouth 4 (four) times daily as needed for cough. 118 mL 0   omeprazole (PRILOSEC) 20 MG capsule Take 1 capsule (20 mg total) by mouth daily. 90 capsule 3   No facility-administered medications prior to visit.    Allergies  Allergen Reactions   Eggs Or Egg-Derived Products    Lactose Intolerance (Gi)    Valtrex [Valacyclovir]     itching   Lipitor [Atorvastatin Calcium]     Nerves, muscle spasms, light stool    Review of Systems  Constitutional:  Negative for chills, fever, malaise/fatigue and weight loss.  HENT:  Negative for hearing loss, sinus pain and sore throat.   Respiratory:  Negative for cough and hemoptysis.   Cardiovascular:  Negative for chest pain, palpitations, leg swelling and PND.  Gastrointestinal:  Negative for abdominal pain, constipation, diarrhea, heartburn, nausea and vomiting.  Genitourinary:  Negative for dysuria, frequency and urgency.  Musculoskeletal:  Negative for back pain, myalgias and neck pain.  Skin:  Negative for itching and rash.  Neurological:  Negative for dizziness, tingling, seizures and headaches.  Endo/Heme/Allergies:  Negative for polydipsia.  Psychiatric/Behavioral:  Negative for depression. The patient is not nervous/anxious.        Objective:    Physical Exam Vitals and nursing note reviewed.  Constitutional:      General: She is not in acute distress.    Appearance: Normal appearance. She is well-developed. She is not ill-appearing or toxic-appearing.  HENT:     Head: Normocephalic and atraumatic.     Right Ear: Tympanic membrane, ear canal and external ear normal. Tympanic membrane is not erythematous, retracted or bulging.     Left Ear: Tympanic membrane, ear canal  and external ear normal. Tympanic membrane is not erythematous, retracted or bulging.  Eyes:     General: Lids are normal.     Conjunctiva/sclera: Conjunctivae normal.     Pupils: Pupils are equal, round, and reactive to light.  Neck:     Trachea: Trachea normal.  Cardiovascular:     Rate and Rhythm: Normal rate and regular rhythm.     Heart sounds: Normal heart sounds, S1 normal and S2 normal.  Pulmonary:     Effort: Pulmonary effort is normal. No tachypnea or respiratory distress.     Breath sounds: Normal breath sounds. No decreased breath sounds, wheezing, rhonchi or rales.  Abdominal:     General: Bowel sounds are normal.     Palpations: Abdomen is soft.     Tenderness: There is no abdominal tenderness.  Musculoskeletal:        General: Normal range of motion.     Cervical back: Full passive range of motion without pain.  Lymphadenopathy:     Cervical: No cervical adenopathy.  Skin:    General: Skin is warm and dry.     Comments: Itchy area on her back overlying a blackhead which was extracted  Neurological:     Mental Status: She is alert.     GCS: GCS eye subscore is 4. GCS verbal subscore is 5. GCS motor subscore is 6.     Cranial Nerves: No cranial nerve deficit.     Sensory: No sensory deficit.     Deep Tendon Reflexes: Reflexes are normal and symmetric.  Psychiatric:        Speech: Speech normal.  Behavior: Behavior normal. Behavior is cooperative.     BP (!) 158/90 (BP Location: Left Arm, Patient Position: Sitting, Cuff Size: Large)   Pulse 82   Temp 98.6 F (37 C) (Oral)   Resp 20   Ht '5\' 5"'$  (1.651 m)   Wt (!) 302 lb (137 kg)   SpO2 97%   BMI 50.26 kg/m  Wt Readings from Last 3 Encounters:  04/28/22 (!) 302 lb (137 kg)  07/25/21 280 lb (127 kg)  07/09/21 280 lb (127 kg)       Assessment & Plan:  Preventative health care Assessment & Plan: Ghm utd Check labs  See avs  Orders: -     CBC with Differential/Platelet -     Comprehensive  metabolic panel -     Lipid panel -     TSH  Dermatitis -     Clobetasol Propionate; Apply 1 Application topically 2 (two) times daily.  Dispense: 30 g; Refill: 0  Mild intermittent asthma without complication Assessment & Plan: Stable  Con't inhalers   Orders: -     Albuterol Sulfate HFA; Inhale 2 puffs into the lungs every 6 (six) hours as needed for wheezing or shortness of breath.  Dispense: 18 g; Refill: 2  Essential hypertension Assessment & Plan: Poorly controlled will alter medications, encouraged DASH diet, minimize caffeine and obtain adequate sleep. Report concerning symptoms and follow up as directed and as needed  Increase benazelpril 20 mg daily   Orders: -     hydroCHLOROthiazide; Take 1 tablet (25 mg total) by mouth daily.  Dispense: 90 tablet; Refill: 1 -     Benazepril HCl; Take 1 tablet (20 mg total) by mouth daily.  Dispense: 90 tablet; Refill: 3  Gastroesophageal reflux disease with esophagitis -     Omeprazole; Take 1 capsule (20 mg total) by mouth daily.  Dispense: 90 capsule; Refill: 3  Hyperlipidemia, unspecified hyperlipidemia type Assessment & Plan: Encourage heart healthy diet such as MIND or DASH diet, increase exercise, avoid trans fats, simple carbohydrates and processed foods, consider a krill or fish or flaxseed oil cap daily.     OSA (obstructive sleep apnea) Assessment & Plan: Has cpap F/u with pulmonary   Severe obesity (BMI >= 40) (HCC) Assessment & Plan: Con't with exercise     Gastroesophageal reflux disease, unspecified whether esophagitis present Assessment & Plan: stable      I,Alexis Herring,acting as a scribe for Ann Held, DO.,have documented all relevant documentation on the behalf of Ann Held, DO,as directed by  Ann Held, DO while in the presence of [place], DO, personally preformed the services described in this documentation.  All  medical record entries made by the scribe were at my direction and in my presence.  I have reviewed the chart and discharge instructions (if applicable) and agree that the record reflects my personal performance and is accurate and complete. 04/28/22   Ann Held, DO

## 2022-04-28 ENCOUNTER — Ambulatory Visit (INDEPENDENT_AMBULATORY_CARE_PROVIDER_SITE_OTHER): Payer: BLUE CROSS/BLUE SHIELD | Admitting: Family Medicine

## 2022-04-28 ENCOUNTER — Encounter: Payer: Self-pay | Admitting: Family Medicine

## 2022-04-28 VITALS — BP 158/90 | HR 82 | Temp 98.6°F | Resp 20 | Ht 65.0 in | Wt 302.0 lb

## 2022-04-28 DIAGNOSIS — G4733 Obstructive sleep apnea (adult) (pediatric): Secondary | ICD-10-CM

## 2022-04-28 DIAGNOSIS — L309 Dermatitis, unspecified: Secondary | ICD-10-CM | POA: Diagnosis not present

## 2022-04-28 DIAGNOSIS — I10 Essential (primary) hypertension: Secondary | ICD-10-CM | POA: Diagnosis not present

## 2022-04-28 DIAGNOSIS — Z0000 Encounter for general adult medical examination without abnormal findings: Secondary | ICD-10-CM | POA: Diagnosis not present

## 2022-04-28 DIAGNOSIS — J4520 Mild intermittent asthma, uncomplicated: Secondary | ICD-10-CM

## 2022-04-28 DIAGNOSIS — K2100 Gastro-esophageal reflux disease with esophagitis, without bleeding: Secondary | ICD-10-CM

## 2022-04-28 DIAGNOSIS — K219 Gastro-esophageal reflux disease without esophagitis: Secondary | ICD-10-CM

## 2022-04-28 DIAGNOSIS — E785 Hyperlipidemia, unspecified: Secondary | ICD-10-CM

## 2022-04-28 LAB — CBC WITH DIFFERENTIAL/PLATELET
Basophils Absolute: 0 10*3/uL (ref 0.0–0.1)
Basophils Relative: 0.6 % (ref 0.0–3.0)
Eosinophils Absolute: 0.2 10*3/uL (ref 0.0–0.7)
Eosinophils Relative: 2.5 % (ref 0.0–5.0)
HCT: 39.3 % (ref 36.0–46.0)
Hemoglobin: 13 g/dL (ref 12.0–15.0)
Lymphocytes Relative: 41.7 % (ref 12.0–46.0)
Lymphs Abs: 2.7 10*3/uL (ref 0.7–4.0)
MCHC: 33 g/dL (ref 30.0–36.0)
MCV: 95.1 fl (ref 78.0–100.0)
Monocytes Absolute: 0.5 10*3/uL (ref 0.1–1.0)
Monocytes Relative: 7.5 % (ref 3.0–12.0)
Neutro Abs: 3 10*3/uL (ref 1.4–7.7)
Neutrophils Relative %: 47.7 % (ref 43.0–77.0)
Platelets: 307 10*3/uL (ref 150.0–400.0)
RBC: 4.13 Mil/uL (ref 3.87–5.11)
RDW: 14.6 % (ref 11.5–15.5)
WBC: 6.3 10*3/uL (ref 4.0–10.5)

## 2022-04-28 LAB — COMPREHENSIVE METABOLIC PANEL
ALT: 10 U/L (ref 0–35)
AST: 12 U/L (ref 0–37)
Albumin: 4 g/dL (ref 3.5–5.2)
Alkaline Phosphatase: 64 U/L (ref 39–117)
BUN: 14 mg/dL (ref 6–23)
CO2: 30 mEq/L (ref 19–32)
Calcium: 9.7 mg/dL (ref 8.4–10.5)
Chloride: 105 mEq/L (ref 96–112)
Creatinine, Ser: 0.67 mg/dL (ref 0.40–1.20)
GFR: 97.74 mL/min (ref >60.00)
Glucose, Bld: 121 mg/dL — ABNORMAL HIGH (ref 70–99)
Potassium: 3.8 mEq/L (ref 3.5–5.1)
Sodium: 143 mEq/L (ref 135–145)
Total Bilirubin: 0.5 mg/dL (ref 0.2–1.2)
Total Protein: 7.3 g/dL (ref 6.0–8.3)

## 2022-04-28 LAB — LIPID PANEL
Cholesterol: 239 mg/dL — ABNORMAL HIGH (ref 0–200)
HDL: 50.7 mg/dL (ref >39.00)
LDL Cholesterol: 164 mg/dL — ABNORMAL HIGH (ref 0–99)
NonHDL: 188.6
Total CHOL/HDL Ratio: 5
Triglycerides: 124 mg/dL (ref 0.0–149.0)
VLDL: 24.8 mg/dL (ref 0.0–40.0)

## 2022-04-28 LAB — TSH: TSH: 2.7 u[IU]/mL (ref 0.35–5.50)

## 2022-04-28 MED ORDER — HYDROCHLOROTHIAZIDE 25 MG PO TABS: 25.0000 mg | ORAL_TABLET | Freq: Every day | ORAL | 1 refills | Status: DC

## 2022-04-28 MED ORDER — BENAZEPRIL HCL 10 MG PO TABS: 10.0000 mg | ORAL_TABLET | Freq: Every day | ORAL | 1 refills | Status: DC

## 2022-04-28 MED ORDER — OMEPRAZOLE 20 MG PO CPDR: 20.0000 mg | DELAYED_RELEASE_CAPSULE | Freq: Every day | ORAL | 3 refills | Status: AC

## 2022-04-28 MED ORDER — ALBUTEROL SULFATE HFA 108 (90 BASE) MCG/ACT IN AERS: 2.0000 | INHALATION_SPRAY | Freq: Four times a day (QID) | RESPIRATORY_TRACT | 2 refills | Status: DC | PRN

## 2022-04-28 MED ORDER — CLOBETASOL PROPIONATE 0.05 % EX OINT: 1.0000 | TOPICAL_OINTMENT | Freq: Two times a day (BID) | CUTANEOUS | 0 refills | Status: DC

## 2022-04-28 MED ORDER — BENAZEPRIL HCL 20 MG PO TABS: 20.0000 mg | ORAL_TABLET | Freq: Every day | ORAL | 3 refills | Status: DC

## 2022-04-28 NOTE — Assessment & Plan Note (Signed)
Poorly controlled will alter medications, encouraged DASH diet, minimize caffeine and obtain adequate sleep. Report concerning symptoms and follow up as directed and as needed  Increase benazelpril 20 mg daily

## 2022-04-28 NOTE — Assessment & Plan Note (Signed)
Encourage heart healthy diet such as MIND or DASH diet, increase exercise, avoid trans fats, simple carbohydrates and processed foods, consider a krill or fish or flaxseed oil cap daily.  °

## 2022-04-28 NOTE — Assessment & Plan Note (Signed)
Ghm utd Check labs  See avs 

## 2022-04-28 NOTE — Assessment & Plan Note (Signed)
Has cpap F/u with pulmonary

## 2022-04-28 NOTE — Assessment & Plan Note (Signed)
Stable  Con't inhalers

## 2022-04-28 NOTE — Assessment & Plan Note (Signed)
stable °

## 2022-04-28 NOTE — Assessment & Plan Note (Signed)
Con't with exercise

## 2022-04-29 ENCOUNTER — Other Ambulatory Visit: Payer: Self-pay | Admitting: Family Medicine

## 2022-04-29 DIAGNOSIS — E785 Hyperlipidemia, unspecified: Secondary | ICD-10-CM

## 2022-05-06 ENCOUNTER — Other Ambulatory Visit: Payer: Self-pay | Admitting: *Deleted

## 2022-05-06 MED ORDER — PITAVASTATIN CALCIUM 2 MG PO TABS: 2.0000 mg | ORAL_TABLET | Freq: Every day | ORAL | 2 refills | Status: DC

## 2022-05-11 ENCOUNTER — Other Ambulatory Visit: Payer: Self-pay | Admitting: Family Medicine

## 2022-05-11 DIAGNOSIS — B009 Herpesviral infection, unspecified: Secondary | ICD-10-CM

## 2022-05-14 ENCOUNTER — Other Ambulatory Visit: Payer: Self-pay | Admitting: Family Medicine

## 2022-05-14 DIAGNOSIS — B009 Herpesviral infection, unspecified: Secondary | ICD-10-CM

## 2022-07-01 ENCOUNTER — Other Ambulatory Visit: Payer: Self-pay | Admitting: Family Medicine

## 2022-07-14 ENCOUNTER — Encounter: Payer: Self-pay | Admitting: Nurse Practitioner

## 2022-07-14 ENCOUNTER — Ambulatory Visit (INDEPENDENT_AMBULATORY_CARE_PROVIDER_SITE_OTHER): Payer: BC Managed Care – PPO | Admitting: Nurse Practitioner

## 2022-07-14 VITALS — BP 132/80 | HR 78 | Ht 63.0 in | Wt 308.0 lb

## 2022-07-14 DIAGNOSIS — B009 Herpesviral infection, unspecified: Secondary | ICD-10-CM

## 2022-07-14 DIAGNOSIS — Z01419 Encounter for gynecological examination (general) (routine) without abnormal findings: Secondary | ICD-10-CM | POA: Diagnosis not present

## 2022-07-14 DIAGNOSIS — R109 Unspecified abdominal pain: Secondary | ICD-10-CM | POA: Diagnosis not present

## 2022-07-14 DIAGNOSIS — Z780 Asymptomatic menopausal state: Secondary | ICD-10-CM | POA: Diagnosis not present

## 2022-07-14 MED ORDER — ACYCLOVIR 5 % EX OINT: TOPICAL_OINTMENT | CUTANEOUS | 2 refills | Status: DC

## 2022-07-14 NOTE — Progress Notes (Signed)
Tiffany Gallagher Aug 30, 1965 [id number]   History:  57 y.o. G2P1011 presents for annual exam. Postmenopausal - no HRT. S/P 1998 TVH for fibroids. Normal pap history. History of HSV-1 with occasional genital and oral outbreaks. She tried Valtrex daily in the past but had itching with continuous use. She does use acyclovir ointment but insurance no longer covers. HTN, HLD managed by PCP. Smoker. Complains of flank pain and occasional urgency.   Gynecologic History No LMP recorded. Patient has had a hysterectomy.   Contraception/Family planning: status post hysterectomy Sexually active: No  Health Maintenance Last Pap: 06/12/2010. Results were: Normal Last mammogram: 01/11/2021. Results were: Normal Last colonoscopy: 05/19/2016. Results were: Benign polyps, 10-year recall Last Dexa: 4-5 years ago. Results were: Normal (managed by PCP)  Past medical history, past surgical history, family history and social history were all reviewed and documented in the EPIC chart. Divorced. Works in [employer]. Son age 63, living with her.   ROS:  A ROS was performed and pertinent positives and negatives are included.  Exam:  Vitals:   07/14/22 0817  BP: 132/80  Pulse: 78  SpO2: 100%  Weight: (!) 308 lb (139.7 kg)  Height: 5\' 3"  (1.6 m)     Body mass index is 54.56 kg/m.  General appearance:  Normal Thyroid:  Symmetrical, normal in size, without palpable masses or nodularity. Respiratory  Auscultation:  Clear without wheezing or rhonchi Cardiovascular  Auscultation:  Regular rate, without rubs, murmurs or gallops  Edema/varicosities:  Not grossly evident Abdominal  Soft,nontender, without masses, guarding or rebound.  Liver/spleen:  No organomegaly noted  Hernia:  None appreciated  Skin  Inspection:  Grossly normal Breasts: Examined lying and sitting.   Right: Without masses, retractions, nipple discharge or axillary adenopathy.   Left: Without masses, retractions, nipple  discharge or axillary adenopathy. Genitourinary   Inguinal/mons:  Normal without inguinal adenopathy  External genitalia:  Normal appearing vulva with no masses, tenderness, or lesions  BUS/Urethra/Skene's glands:  Normal  Vagina:  Normal appearing with normal color and discharge, no lesions  Cervix:  And uterus absent  Adnexa/parametria:     Rt: Normal in size, without masses or tenderness.   Lt: Normal in size, without masses or tenderness.  Anus and perineum: Normal  Digital rectal exam: Deferred  Patient informed chaperone available to be present for breast and pelvic exam. Patient has requested no chaperone to be present. Patient has been advised what will be completed during breast and pelvic exam.   Assessment/Plan:  57 y.o. G2P1011 for annual exam.   Well female exam with routine gynecological exam - Education provided on SBEs, importance of preventative screenings, current guidelines, high calcium diet, regular exercise, and multivitamin daily. Labs with PCP.   Postmenopausal - No HRT. S/P TVH for fibroids.   Flank pain - Plan: Urinalysis,Complete w/RFL Culture. UA unremarkable. Culture pending. Back pain likely musculoskeletal, plans to follow up with PCP if it continues.   HSV infection - Plan: acyclovir ointment (ZOVIRAX) 5 % TID as needed. Occasional outbreaks. Does not tolerate daily Valtrex due to itching.   Screening for cervical cancer - Normal Pap history.  No longer screening per guidelines.   Screening for breast cancer - Normal mammogram history. Overdue and plans to schedule soon. Normal breast exam today.  Screening for colon cancer - 2018 colonoscopy. Will repeat at GI's recommended interval.   Screening for osteoporosis - Normal bone density around 5 years ago per patient. Managed by PCP.   Return in  1 year for annual.                   Tamela Gammon DNP, 8:49 AM 07/14/2022

## 2022-07-16 LAB — URINALYSIS, COMPLETE W/RFL CULTURE
Bacteria, UA: NONE SEEN /HPF
Bilirubin Urine: NEGATIVE
Glucose, UA: NEGATIVE
Hyaline Cast: NONE SEEN /LPF
Ketones, ur: NEGATIVE
Leukocyte Esterase: NEGATIVE
Nitrites, Initial: NEGATIVE
Specific Gravity, Urine: 1.025 (ref 1.001–1.035)
WBC, UA: NONE SEEN /HPF (ref 0–5)
pH: 7 (ref 5.0–8.0)

## 2022-07-16 LAB — URINE CULTURE
MICRO NUMBER:: 14736100
Result:: NO GROWTH
SPECIMEN QUALITY:: ADEQUATE

## 2022-07-16 LAB — CULTURE INDICATED

## 2022-08-04 ENCOUNTER — Encounter: Payer: Self-pay | Admitting: *Deleted

## 2022-08-05 ENCOUNTER — Other Ambulatory Visit (INDEPENDENT_AMBULATORY_CARE_PROVIDER_SITE_OTHER): Payer: BC Managed Care – PPO

## 2022-08-05 DIAGNOSIS — E785 Hyperlipidemia, unspecified: Secondary | ICD-10-CM

## 2022-08-05 LAB — LIPID PANEL
Cholesterol: 215 mg/dL — ABNORMAL HIGH (ref 0–200)
HDL: 44.2 mg/dL (ref >39.00)
LDL Cholesterol: 143 mg/dL — ABNORMAL HIGH (ref 0–99)
NonHDL: 170.59
Total CHOL/HDL Ratio: 5
Triglycerides: 138 mg/dL (ref 0.0–149.0)
VLDL: 27.6 mg/dL (ref 0.0–40.0)

## 2022-08-05 LAB — COMPREHENSIVE METABOLIC PANEL
ALT: 12 U/L (ref 0–35)
AST: 13 U/L (ref 0–37)
Albumin: 3.9 g/dL (ref 3.5–5.2)
Alkaline Phosphatase: 67 U/L (ref 39–117)
BUN: 12 mg/dL (ref 6–23)
CO2: 29 mEq/L (ref 19–32)
Calcium: 9.1 mg/dL (ref 8.4–10.5)
Chloride: 105 mEq/L (ref 96–112)
Creatinine, Ser: 0.63 mg/dL (ref 0.40–1.20)
GFR: 99.01 mL/min (ref >60.00)
Glucose, Bld: 162 mg/dL — ABNORMAL HIGH (ref 70–99)
Potassium: 3.7 mEq/L (ref 3.5–5.1)
Sodium: 142 mEq/L (ref 135–145)
Total Bilirubin: 0.5 mg/dL (ref 0.2–1.2)
Total Protein: 6.7 g/dL (ref 6.0–8.3)

## 2022-08-13 ENCOUNTER — Other Ambulatory Visit: Payer: Self-pay | Admitting: Family Medicine

## 2022-08-13 DIAGNOSIS — E785 Hyperlipidemia, unspecified: Secondary | ICD-10-CM

## 2022-08-15 ENCOUNTER — Other Ambulatory Visit: Payer: Self-pay

## 2022-08-15 DIAGNOSIS — E785 Hyperlipidemia, unspecified: Secondary | ICD-10-CM

## 2022-09-14 ENCOUNTER — Encounter (HOSPITAL_COMMUNITY): Payer: Self-pay | Admitting: Emergency Medicine

## 2022-09-14 ENCOUNTER — Ambulatory Visit (HOSPITAL_COMMUNITY)
Admission: EM | Admit: 2022-09-14 | Discharge: 2022-09-14 | Disposition: A | Discharge status: Home / Self Care | Payer: BC Managed Care – PPO | Attending: Internal Medicine | Admitting: Internal Medicine

## 2022-09-14 ENCOUNTER — Other Ambulatory Visit: Payer: Self-pay

## 2022-09-14 DIAGNOSIS — J069 Acute upper respiratory infection, unspecified: Secondary | ICD-10-CM | POA: Diagnosis not present

## 2022-09-14 DIAGNOSIS — R0789 Other chest pain: Secondary | ICD-10-CM

## 2022-09-14 DIAGNOSIS — J4520 Mild intermittent asthma, uncomplicated: Secondary | ICD-10-CM

## 2022-09-14 DIAGNOSIS — M791 Myalgia, unspecified site: Secondary | ICD-10-CM | POA: Diagnosis not present

## 2022-09-14 DIAGNOSIS — F17210 Nicotine dependence, cigarettes, uncomplicated: Secondary | ICD-10-CM

## 2022-09-14 MED ORDER — BENZONATATE 100 MG PO CAPS: 100.0000 mg | ORAL_CAPSULE | Freq: Three times a day (TID) | ORAL | 0 refills | Status: DC

## 2022-09-14 NOTE — Discharge Instructions (Signed)
You have a viral upper respiratory infection.   Use the following medicines to help with symptoms: - Plain Mucinex (guaifenesin) over the counter as directed every 12 hours to thin mucous so that you are able to get it out of your body easier. Drink plenty of water while taking this medication so that it works well in your body (at least 8 cups a day).  - Tylenol 1,000mg  and/or ibuprofen 600mg  every 6 hours with food as needed for aches/pains or fever/chills.  - Tessalon perles every 8 hours as needed for cough. - Use your rescue inhaler as needed for shortness of breath and wheezing.  1 tablespoon of honey in warm water and/or salt water gargles may also help with symptoms. Humidifier to your room will help add water to the air and reduce coughing.  If you develop any new or worsening symptoms, please return.  If your symptoms are severe, please go to the emergency room.  Follow-up with your primary care provider for further evaluation and management of your symptoms as well as ongoing wellness visits.  I hope you feel better!

## 2022-09-14 NOTE — ED Provider Notes (Signed)
[facility]    CSN: [id number] Arrival date & time: 09/14/22  1001      History   Chief Complaint Chief Complaint  Patient presents with   Chest Pain   multiple complain    Pt arrive c/o right leg pain for 2 days, HA, tingling on her hands and cp with SOB.    HPI Tiffany Gallagher is a 57 y.o. female.   Patient presents to urgent care for evaluation of multiple symptoms/complaints.  She mainly complains of chest discomfort that started to the right side of her chest and has now become painful to the bilateral chest wall starting 2 days ago.  Describes chest pains as tight/sharp and intermittent.  She states chest pain causes pain to her head as well.  When asked if she has had a cough recently, she remembers that she has had a cough and has felt unwell since Monday, Sep 08, 2022.  Her illness started 1 week ago with cough, nasal congestion, throat irritation, and possible low-grade fever at home.  Her coworker has been sick with similar symptoms.  She also reports body aches and shortness of breath associated with coughing.  Cough started out as dry but has become more productive over the last 2 to 3 days with clear sputum.  Denies nausea, vomiting, diarrhea, abdominal pain, rash, recent antibiotic/steroid use, dizziness, vision changes, neck pain, new back pain, heart palpitations, weakness, and orthopnea/leg swelling.  Nasal congestion and sore throat have improved/resolved.  She has a history of asthma but has not needed to use her albuterol inhaler in the last 1 to 2 months or while she has been sick with this illness.  She is also a current everyday cigarette smoker and has been for the last 40 years.  She has been using Mucinex over-the-counter as well as Tylenol/ibuprofen with some relief of symptoms.   Chest Pain   Past Medical History:  Diagnosis Date   Allergy    Diverticulitis    Fibroid    Hyperlipidemia    Hypertension    Migraines    Obesity, morbid, BMI  40.0-49.9 (HCC)    Sleep apnea    cpap    Patient Active Problem List   Diagnosis Date Noted   History of total vaginal hysterectomy (TVH) 07/04/2020   Healthcare maintenance 04/26/2020   It band syndrome, right 04/24/2020   Hyperlipidemia 09/19/2019   Mild intermittent asthma without complication 09/19/2019   Hair loss 09/19/2019   Dyspepsia 09/19/2019   Right hip pain 09/19/2019   Leg skin lesion, right 06/09/2017   OSA (obstructive sleep apnea) 06/05/2016   Preventative health care 02/09/2015   Acute bacterial bronchitis 11/15/2014   Severe obesity (BMI >= 40) (HCC) 05/10/2014   Physical exam 01/20/2014   HSV-1 infection 06/20/2013   Migraines    Smoker    Sinusitis 04/11/2011   Leg cramps 09/18/2010   Edema 09/18/2010   VAGINAL DISCHARGE 09/20/2009   KNEE PAIN, BILATERAL 09/20/2009   FLATULENCE ERUCTATION AND GAS PAIN 08/03/2009   HEMATURIA, HX OF 07/04/2009   Essential hypertension 02/07/2008   GERD 11/30/2006    Past Surgical History:  Procedure Laterality Date   bone spur     removed   CESAREAN SECTION  1995   VAGINAL HYSTERECTOMY  1998   partial    OB History     Gravida  2   Para  1   Term  1   Preterm      AB  1   Living  1      SAB      IAB      Ectopic      Multiple      Live Births               Home Medications    Prior to Admission medications   Medication Sig Start Date End Date Taking? Authorizing Provider  benzonatate (TESSALON) 100 MG capsule Take 1 capsule (100 mg total) by mouth every 8 (eight) hours. 09/14/22  Yes Carlisle Beers, FNP  acetaminophen (TYLENOL) 500 MG tablet Take 2 tablets (1,000 mg total) by mouth every 6 (six) hours as needed. 11/10/21   Carlisle Beers, FNP  acyclovir ointment (ZOVIRAX) 5 % Apply three times per day as needed 07/14/22   Wyline Beady A, NP  albuterol (VENTOLIN HFA) 108 (90 Base) MCG/ACT inhaler Inhale 2 puffs into the lungs every 6 (six) hours as needed for wheezing  or shortness of breath. 04/28/22   Donato Schultz, DO  Ascorbic Acid (VITAMIN C) 100 MG tablet Take 100 mg by mouth daily.    [provider]  benazepril (LOTENSIN) 20 MG tablet Take 1 tablet (20 mg total) by mouth daily. 04/28/22   Seabron Spates R, DO  CALCIUM PO Take 1 tablet by mouth daily.    [provider]  clobetasol ointment (TEMOVATE) 0.05 % Apply 1 Application topically 2 (two) times daily. 04/28/22   Seabron Spates R, DO  fluticasone (FLONASE) 50 MCG/ACT nasal spray Place 2 sprays into both nostrils daily. 06/09/17   Seabron Spates R, DO  Guaifenesin 1200 MG TB12 Take 1 tablet (1,200 mg total) by mouth in the morning and at bedtime. 11/10/21   Carlisle Beers, FNP  hydrochlorothiazide (HYDRODIURIL) 25 MG tablet Take 1 tablet (25 mg total) by mouth daily. 04/28/22   Donato Schultz, DO  ibuprofen (ADVIL) 600 MG tablet Take 1 tablet (600 mg total) by mouth every 6 (six) hours as needed. 11/10/21   Carlisle Beers, FNP  Multiple Vitamin (MULTIVITAMIN WITH MINERALS) TABS Take 1 tablet by mouth daily.    [provider]  Omega-3 Fatty Acids (FISH OIL PO) Take by mouth.    [provider]  omeprazole (PRILOSEC) 20 MG capsule Take 1 capsule (20 mg total) by mouth daily. 04/28/22 09/04/24  Donato Schultz, DO  Orlistat (ALLI PO) Take 1 capsule by mouth daily.    [provider]  thiamine (VITAMIN B-1) 100 MG tablet Take 100 mg by mouth daily.    [provider]  VITAMIN D PO Take 4,000 Units by mouth daily.    [provider]    Family History Family History  Problem Relation Age of Onset   Coronary artery disease Mother    Hypertension Mother    Kidney disease Mother 62       dialysis   Aneurysm Mother        brain   Stroke Mother    Heart disease Mother        MI   Cancer Father 9       brain cancer   Diabetes Sister    Hyperlipidemia Sister    Hypertension Sister    Hypertension  Sister    Arthritis Brother    Hypertension Brother    Diabetes Brother    Hypertension Brother    Heart attack Brother     Social History Social  History   Tobacco Use   Smoking status: Every Day    Packs/day: 0.50    Years: 32.00    Additional pack years: 0.00    Total pack years: 16.00    Types: Cigarettes   Smokeless tobacco: Never   Tobacco comments:    cutting back  Vaping Use   Vaping Use: Never used  Substance Use Topics   Alcohol use: Yes    Comment: Occas wine   Drug use: Yes    Types: Marijuana    Comment: occ     Allergies   Egg-derived products, Lactose intolerance (gi), Valtrex [valacyclovir], and Lipitor [atorvastatin calcium]   Review of Systems Review of Systems  Cardiovascular:  Positive for chest pain.  Per HPI   Physical Exam Triage Vital Signs ED Triage Vitals  Enc Vitals Group     BP 09/14/22 1025 (!) 144/89     Pulse Rate 09/14/22 1025 81     Resp 09/14/22 1025 18     Temp 09/14/22 1025 99 F (37.2 C)     Temp Source 09/14/22 1025 Oral     SpO2 09/14/22 1025 95 %     Weight 09/14/22 1026 (!) 308 lb 10.3 oz (140 kg)     Height 09/14/22 1026 5\' 3"  (1.6 m)     Head Circumference --      Peak Flow --      Pain Score 09/14/22 1026 5     Pain Loc --      Pain Edu? --      Excl. in GC? --    No data found.  Updated Vital Signs BP (!) 144/89 (BP Location: Right Arm)   Pulse 81   Temp 99 F (37.2 C) (Oral)   Resp 18   Ht 5\' 3"  (1.6 m)   Wt (!) 308 lb 10.3 oz (140 kg)   SpO2 95%   BMI 54.67 kg/m   Visual Acuity Right Eye Distance:   Left Eye Distance:   Bilateral Distance:    Right Eye Near:   Left Eye Near:    Bilateral Near:     Physical Exam Vitals and nursing note reviewed.  Constitutional:      Appearance: She is obese. She is not ill-appearing or toxic-appearing.  HENT:     Head: Normocephalic and atraumatic.     Right Ear: Hearing, tympanic membrane, ear canal and external ear normal.     Left Ear: Hearing,  tympanic membrane, ear canal and external ear normal.     Nose: Congestion present.     Mouth/Throat:     Lips: Pink.     Mouth: Mucous membranes are moist. No injury.     Tongue: No lesions. Tongue does not deviate from midline.     Palate: No mass and lesions.     Pharynx: Oropharynx is clear. Uvula midline. No pharyngeal swelling, oropharyngeal exudate, posterior oropharyngeal erythema or uvula swelling.     Tonsils: No tonsillar exudate or tonsillar abscesses.  Eyes:     General: Lids are normal. Vision grossly intact. Gaze aligned appropriately.     Extraocular Movements: Extraocular movements intact.     Conjunctiva/sclera: Conjunctivae normal.  Cardiovascular:     Rate and Rhythm: Normal rate and regular rhythm.     Heart sounds: Normal heart sounds, S1 normal and S2 normal.  Pulmonary:     Effort: Pulmonary effort is normal. No respiratory distress.     Breath sounds: Normal breath sounds and air  entry. No stridor. No wheezing, rhonchi or rales.  Chest:     Chest wall: Tenderness (Chest tenderness reproducible to palpation of the left upper chest wall) present.  Musculoskeletal:     Cervical back: Neck supple. No tenderness.     Right lower leg: No edema.     Left lower leg: No edema.  Lymphadenopathy:     Cervical: No cervical adenopathy.  Skin:    General: Skin is warm and dry.     Capillary Refill: Capillary refill takes less than 2 seconds.     Findings: No rash.  Neurological:     General: No focal deficit present.     Mental Status: She is alert and oriented to person, place, and time. Mental status is at baseline.     Cranial Nerves: No cranial nerve deficit, dysarthria or facial asymmetry.     Motor: No weakness.     Coordination: Coordination normal.     Gait: Gait normal.  Psychiatric:        Mood and Affect: Mood normal.        Speech: Speech normal.        Behavior: Behavior normal.        Thought Content: Thought content normal.        Judgment:  Judgment normal.      UC Treatments / Results  Labs (all labs ordered are listed, but only abnormal results are displayed) Labs Reviewed - No data to display  EKG   Radiology No results found.  Procedures Procedures (including critical care time)  Medications Ordered in UC Medications - No data to display  Initial Impression / Assessment and Plan / UC Course  I have reviewed the triage vital signs and the nursing notes.  Pertinent labs & imaging results that were available during my care of the patient were reviewed by me and considered in my medical decision making (see chart for details).   1.  Viral URI with cough, mild intermittent asthma without complication, cigarette nicotine dependence without complication, tightness in chest, myalgia Symptoms and physical exam consistent with a viral upper respiratory tract infection that will likely resolve with rest, fluids, and prescriptions for symptomatic relief. Deferred imaging based on stable cardiopulmonary exam and hemodynamically stable vital signs. Deferred viral testing using shared decision making as patient is low risk for severe disease related to potential COVID-19 illness and there is low suspicion for influenza. Discussed updated CDC guidelines for masking related to COVID-19.   EKG shows normal sinus rhythm without concerning ST/T wave changes.  Low suspicion for cardiac etiology of patient's chest discomfort as chest pain is reproducible to palpation of the chest wall.  Chest discomfort likely due to cough.  Tessalon Perles sent to pharmacy for symptomatic relief to be taken as prescribed.  May continue taking over the counter medications as directed for further symptomatic relief.  Nonpharmacologic interventions for symptom relief provided and after visit summary below. Advised to push fluids to stay well hydrated while recovering from viral illness.   Discussed physical exam and available lab work findings in clinic  with patient.  Counseled patient regarding appropriate use of medications and potential side effects for all medications recommended or prescribed today. Discussed red flag signs and symptoms of worsening condition,when to call the PCP office, return to urgent care, and when to seek higher level of care in the emergency department. Patient verbalizes understanding and agreement with plan. All questions answered. Patient discharged in stable condition.  Final Clinical Impressions(s) / UC Diagnoses   Final diagnoses:  Tightness in chest  Myalgia  Viral URI with cough  Mild intermittent asthma without status asthmaticus without complication  Cigarette nicotine dependence without complication     Discharge Instructions      You have a viral upper respiratory infection.   Use the following medicines to help with symptoms: - Plain Mucinex (guaifenesin) over the counter as directed every 12 hours to thin mucous so that you are able to get it out of your body easier. Drink plenty of water while taking this medication so that it works well in your body (at least 8 cups a day).  - Tylenol 1,000mg  and/or ibuprofen 600mg  every 6 hours with food as needed for aches/pains or fever/chills.  - Tessalon perles every 8 hours as needed for cough. - Use your rescue inhaler as needed for shortness of breath and wheezing.  1 tablespoon of honey in warm water and/or salt water gargles may also help with symptoms. Humidifier to your room will help add water to the air and reduce coughing.  If you develop any new or worsening symptoms, please return.  If your symptoms are severe, please go to the emergency room.  Follow-up with your primary care provider for further evaluation and management of your symptoms as well as ongoing wellness visits.  I hope you feel better!      ED Prescriptions     Medication Sig Dispense Auth. Provider   benzonatate (TESSALON) 100 MG capsule Take 1 capsule (100 mg total) by  mouth every 8 (eight) hours. 21 capsule Carlisle Beers, FNP      PDMP not reviewed this encounter.   Carlisle Beers, [place] 09/14/22 1119

## 2022-09-14 NOTE — ED Triage Notes (Signed)
Pt arrive c/o right leg pain for 2 days, HA, tingling on her hands and cp with SOB.

## 2022-10-19 ENCOUNTER — Ambulatory Visit (HOSPITAL_COMMUNITY)
Admission: EM | Admit: 2022-10-19 | Discharge: 2022-10-19 | Disposition: A | Discharge status: Home / Self Care | Payer: BC Managed Care – PPO | Attending: Emergency Medicine | Admitting: Emergency Medicine

## 2022-10-19 ENCOUNTER — Emergency Department (HOSPITAL_COMMUNITY)
Admission: EM | Admit: 2022-10-19 | Discharge: 2022-10-19 | Disposition: A | Discharge status: Home / Self Care | Payer: BC Managed Care – PPO | Attending: Emergency Medicine | Admitting: Emergency Medicine

## 2022-10-19 ENCOUNTER — Other Ambulatory Visit: Payer: Self-pay

## 2022-10-19 ENCOUNTER — Encounter (HOSPITAL_COMMUNITY): Payer: Self-pay

## 2022-10-19 ENCOUNTER — Emergency Department (HOSPITAL_COMMUNITY): Payer: BC Managed Care – PPO

## 2022-10-19 DIAGNOSIS — R739 Hyperglycemia, unspecified: Secondary | ICD-10-CM | POA: Diagnosis not present

## 2022-10-19 DIAGNOSIS — F17210 Nicotine dependence, cigarettes, uncomplicated: Secondary | ICD-10-CM | POA: Diagnosis not present

## 2022-10-19 DIAGNOSIS — Z7984 Long term (current) use of oral hypoglycemic drugs: Secondary | ICD-10-CM | POA: Insufficient documentation

## 2022-10-19 DIAGNOSIS — E1165 Type 2 diabetes mellitus with hyperglycemia: Secondary | ICD-10-CM | POA: Diagnosis not present

## 2022-10-19 DIAGNOSIS — Z79899 Other long term (current) drug therapy: Secondary | ICD-10-CM | POA: Diagnosis not present

## 2022-10-19 DIAGNOSIS — I700 Atherosclerosis of aorta: Secondary | ICD-10-CM | POA: Diagnosis not present

## 2022-10-19 DIAGNOSIS — I10 Essential (primary) hypertension: Secondary | ICD-10-CM | POA: Insufficient documentation

## 2022-10-19 DIAGNOSIS — Z794 Long term (current) use of insulin: Secondary | ICD-10-CM | POA: Diagnosis not present

## 2022-10-19 DIAGNOSIS — R000 Tachycardia, unspecified: Secondary | ICD-10-CM | POA: Diagnosis not present

## 2022-10-19 DIAGNOSIS — R079 Chest pain, unspecified: Secondary | ICD-10-CM | POA: Diagnosis not present

## 2022-10-19 DIAGNOSIS — E119 Type 2 diabetes mellitus without complications: Secondary | ICD-10-CM

## 2022-10-19 LAB — BASIC METABOLIC PANEL
Anion gap: 15 (ref 5–15)
BUN: 13 mg/dL (ref 6–20)
CO2: 24 mmol/L (ref 22–32)
Calcium: 9.6 mg/dL (ref 8.9–10.3)
Chloride: 93 mmol/L — ABNORMAL LOW (ref 98–111)
Creatinine, Ser: 0.93 mg/dL (ref 0.44–1.00)
GFR, Estimated: >60 mL/min (ref >60)
Glucose, Bld: 461 mg/dL — ABNORMAL HIGH (ref 70–99)
Potassium: 3.8 mmol/L (ref 3.5–5.1)
Sodium: 132 mmol/L — ABNORMAL LOW (ref 135–145)

## 2022-10-19 LAB — URINALYSIS, ROUTINE W REFLEX MICROSCOPIC
Bilirubin Urine: NEGATIVE
Glucose, UA: >=500 mg/dL — AB
Hgb urine dipstick: NEGATIVE
Ketones, ur: 20 mg/dL — AB
Leukocytes,Ua: NEGATIVE
Nitrite: NEGATIVE
Protein, ur: 100 mg/dL — AB
Specific Gravity, Urine: 1.036 — ABNORMAL HIGH (ref 1.005–1.030)
pH: 5 (ref 5.0–8.0)

## 2022-10-19 LAB — I-STAT VENOUS BLOOD GAS, ED
Acid-Base Excess: 2 mmol/L (ref 0.0–2.0)
Bicarbonate: 27.1 mmol/L (ref 20.0–28.0)
Calcium, Ion: 1.18 mmol/L (ref 1.15–1.40)
HCT: 48 % — ABNORMAL HIGH (ref 36.0–46.0)
Hemoglobin: 16.3 g/dL — ABNORMAL HIGH (ref 12.0–15.0)
O2 Saturation: 75 %
Potassium: 3.9 mmol/L (ref 3.5–5.1)
Sodium: 134 mmol/L — ABNORMAL LOW (ref 135–145)
TCO2: 28 mmol/L (ref 22–32)
pCO2, Ven: 43.8 mmHg — ABNORMAL LOW (ref 44–60)
pH, Ven: 7.399 (ref 7.25–7.43)
pO2, Ven: 40 mmHg (ref 32–45)

## 2022-10-19 LAB — CBC
HCT: 45.7 % (ref 36.0–46.0)
Hemoglobin: 15.7 g/dL — ABNORMAL HIGH (ref 12.0–15.0)
MCH: 31.1 pg (ref 26.0–34.0)
MCHC: 34.4 g/dL (ref 30.0–36.0)
MCV: 90.5 fL (ref 80.0–100.0)
Platelets: 280 10*3/uL (ref 150–400)
RBC: 5.05 MIL/uL (ref 3.87–5.11)
RDW: 12.7 % (ref 11.5–15.5)
WBC: 8.1 10*3/uL (ref 4.0–10.5)
nRBC: 0 % (ref 0.0–0.2)

## 2022-10-19 LAB — POCT URINALYSIS DIP (MANUAL ENTRY)
Glucose, UA: >=1000 mg/dL — AB
Leukocytes, UA: NEGATIVE
Nitrite, UA: NEGATIVE
Protein Ur, POC: 30 mg/dL — AB
Spec Grav, UA: 1.025 (ref 1.010–1.025)
Urobilinogen, UA: 0.2 E.U./dL
pH, UA: 5 (ref 5.0–8.0)

## 2022-10-19 LAB — I-STAT CHEM 8, ED
BUN: 14 mg/dL (ref 6–20)
Calcium, Ion: 1.19 mmol/L (ref 1.15–1.40)
Chloride: 96 mmol/L — ABNORMAL LOW (ref 98–111)
Creatinine, Ser: 0.8 mg/dL (ref 0.44–1.00)
Glucose, Bld: 470 mg/dL — ABNORMAL HIGH (ref 70–99)
HCT: 49 % — ABNORMAL HIGH (ref 36.0–46.0)
Hemoglobin: 16.7 g/dL — ABNORMAL HIGH (ref 12.0–15.0)
Potassium: 3.9 mmol/L (ref 3.5–5.1)
Sodium: 135 mmol/L (ref 135–145)
TCO2: 26 mmol/L (ref 22–32)

## 2022-10-19 LAB — CBG MONITORING, ED
Glucose-Capillary: 459 mg/dL — ABNORMAL HIGH (ref 70–99)
Glucose-Capillary: 370 mg/dL — ABNORMAL HIGH (ref 70–99)
Glucose-Capillary: 277 mg/dL — ABNORMAL HIGH (ref 70–99)

## 2022-10-19 LAB — POCT FASTING CBG KUC MANUAL ENTRY: POCT Glucose (KUC): 473 mg/dL — AB (ref 70–99)

## 2022-10-19 LAB — MAGNESIUM: Magnesium: 1.6 mg/dL — ABNORMAL LOW (ref 1.7–2.4)

## 2022-10-19 LAB — CK: Total CK: 133 U/L (ref 38–234)

## 2022-10-19 MED ORDER — SODIUM CHLORIDE 0.9 % IV BOLUS
1000.0000 mL | Freq: Once | INTRAVENOUS | Status: AC
  Administered 2022-10-19: 1000 mL via INTRAVENOUS

## 2022-10-19 MED ORDER — MAGNESIUM SULFATE 2 GM/50ML IV SOLN
2.0000 g | Freq: Once | INTRAVENOUS | Status: AC
  Administered 2022-10-19: 2 g via INTRAVENOUS
  Filled 2022-10-19: qty 50

## 2022-10-19 MED ORDER — INSULIN ASPART 100 UNIT/ML IJ SOLN
10.0000 [IU] | Freq: Once | INTRAMUSCULAR | Status: AC
  Administered 2022-10-19: 10 [IU] via INTRAVENOUS

## 2022-10-19 MED ORDER — ACETAMINOPHEN 500 MG PO TABS
1000.0000 mg | ORAL_TABLET | Freq: Once | ORAL | Status: AC
  Administered 2022-10-19: 1000 mg via ORAL
  Filled 2022-10-19: qty 2

## 2022-10-19 MED ORDER — DIAZEPAM 2 MG PO TABS
2.0000 mg | ORAL_TABLET | Freq: Once | ORAL | Status: AC
  Administered 2022-10-19: 2 mg via ORAL
  Filled 2022-10-19: qty 1

## 2022-10-19 MED ORDER — METFORMIN HCL 500 MG PO TABS: 500.0000 mg | ORAL_TABLET | Freq: Two times a day (BID) | ORAL | 0 refills | Status: DC

## 2022-10-19 NOTE — ED Notes (Signed)
Patient is being discharged from the Urgent Care and sent to the Emergency Department via self . Per provider, patient is in need of higher level of care due to hyperglycemia, new onset diabetes. Patient is aware and verbalizes understanding of plan of care.  Vitals:   10/19/22 1529  BP: 122/89  Pulse: (!) 103  Resp: 16  Temp: 98.7 F (37.1 C)  SpO2: 94%

## 2022-10-19 NOTE — ED Provider Notes (Signed)
[facility]    CSN: [id number] Arrival date & time: 10/19/22  1358      History   Chief Complaint Chief Complaint  Patient presents with   Urinary Frequency    HPI Tiffany Gallagher is a 57 y.o. female.  Here with 5 day history of urinary frequency, increased thirst Associated nausea but no vomiting. Not having abdominal pain. No diarrhea. Normal BM Also reporting muscle aches, feeling sweaty and weak. Today started feeling slight dizziness.  Has not measured temp No known sick contacts, no recent travel   No hx DM. Last A1c in 04/2021, was 5.6  Past Medical History:  Diagnosis Date   Allergy    Diverticulitis    Fibroid    Hyperlipidemia    Hypertension    Migraines    Obesity, morbid, BMI 40.0-49.9 (HCC)    Sleep apnea    cpap    Patient Active Problem List   Diagnosis Date Noted   History of total vaginal hysterectomy (TVH) 07/04/2020   Healthcare maintenance 04/26/2020   It band syndrome, right 04/24/2020   Hyperlipidemia 09/19/2019   Mild intermittent asthma without complication 09/19/2019   Hair loss 09/19/2019   Dyspepsia 09/19/2019   Right hip pain 09/19/2019   Leg skin lesion, right 06/09/2017   OSA (obstructive sleep apnea) 06/05/2016   Preventative health care 02/09/2015   Acute bacterial bronchitis 11/15/2014   Severe obesity (BMI >= 40) (HCC) 05/10/2014   Physical exam 01/20/2014   HSV-1 infection 06/20/2013   Migraines    Smoker    Sinusitis 04/11/2011   Leg cramps 09/18/2010   Edema 09/18/2010   VAGINAL DISCHARGE 09/20/2009   KNEE PAIN, BILATERAL 09/20/2009   FLATULENCE ERUCTATION AND GAS PAIN 08/03/2009   HEMATURIA, HX OF 07/04/2009   Essential hypertension 02/07/2008   GERD 11/30/2006    Past Surgical History:  Procedure Laterality Date   bone spur     removed   CESAREAN SECTION  1995   VAGINAL HYSTERECTOMY  1998   partial    OB History     Gravida  2   Para  1   Term  1   Preterm      AB  1    Living  1      SAB      IAB      Ectopic      Multiple      Live Births               Home Medications    Prior to Admission medications   Medication Sig Start Date End Date Taking? Authorizing Provider  acyclovir ointment (ZOVIRAX) 5 % Apply three times per day as needed 07/14/22   Wyline Beady A, NP  albuterol (VENTOLIN HFA) 108 (90 Base) MCG/ACT inhaler Inhale 2 puffs into the lungs every 6 (six) hours as needed for wheezing or shortness of breath. 04/28/22   Donato Schultz, DO  Ascorbic Acid (VITAMIN C) 100 MG tablet Take 100 mg by mouth daily.    [provider]  benazepril (LOTENSIN) 20 MG tablet Take 1 tablet (20 mg total) by mouth daily. 04/28/22   Seabron Spates R, DO  CALCIUM PO Take 1 tablet by mouth daily.    [provider]  hydrochlorothiazide (HYDRODIURIL) 25 MG tablet Take 1 tablet (25 mg total) by mouth daily. 04/28/22   Donato Schultz, DO  Multiple Vitamin (MULTIVITAMIN WITH MINERALS) TABS Take 1 tablet by mouth  daily.    [provider]  Omega-3 Fatty Acids (FISH OIL PO) Take by mouth.    [provider]  omeprazole (PRILOSEC) 20 MG capsule Take 1 capsule (20 mg total) by mouth daily. 04/28/22 09/04/24  Donato Schultz, DO  Orlistat (ALLI PO) Take 1 capsule by mouth daily.    [provider]  thiamine (VITAMIN B-1) 100 MG tablet Take 100 mg by mouth daily.    [provider]  VITAMIN D PO Take 4,000 Units by mouth daily.    [provider]    Family History Family History  Problem Relation Age of Onset   Coronary artery disease Mother    Hypertension Mother    Kidney disease Mother 80       dialysis   Aneurysm Mother        brain   Stroke Mother    Heart disease Mother        MI   Cancer Father 36       brain cancer   Diabetes Sister    Hyperlipidemia Sister    Hypertension Sister    Hypertension Sister    Arthritis Brother    Hypertension Brother    Diabetes  Brother    Hypertension Brother    Heart attack Brother     Social History Social History   Tobacco Use   Smoking status: Every Day    Packs/day: 0.50    Years: 32.00    Additional pack years: 0.00    Total pack years: 16.00    Types: Cigarettes   Smokeless tobacco: Never   Tobacco comments:    cutting back  Vaping Use   Vaping Use: Never used  Substance Use Topics   Alcohol use: Yes    Comment: Occas wine   Drug use: Yes    Types: Marijuana    Comment: occ     Allergies   Egg-derived products, Lactose intolerance (gi), Valtrex [valacyclovir], and Lipitor [atorvastatin calcium]   Review of Systems Review of Systems As per HPI  Physical Exam Triage Vital Signs ED Triage Vitals  Enc Vitals Group     BP 10/19/22 1529 122/89     Pulse Rate 10/19/22 1529 (!) 103     Resp 10/19/22 1529 16     Temp 10/19/22 1529 98.7 F (37.1 C)     Temp Source 10/19/22 1529 Oral     SpO2 10/19/22 1529 94 %     Weight 10/19/22 1529 (!) 307 lb (139.3 kg)     Height 10/19/22 1529 5\' 3"  (1.6 m)     Head Circumference --      Peak Flow --      Pain Score 10/19/22 1528 10     Pain Loc --      Pain Edu? --      Excl. in GC? --    No data found.  Updated Vital Signs BP 122/89 (BP Location: Right Arm)   Pulse (!) 103   Temp 98.7 F (37.1 C) (Oral)   Resp 16   Ht 5\' 3"  (1.6 m)   Wt (!) 307 lb (139.3 kg)   SpO2 94%   BMI 54.38 kg/m   Physical Exam Vitals and nursing note reviewed.  Constitutional:      General: She is not in acute distress.    Appearance: Normal appearance. She is obese. She is not ill-appearing or diaphoretic.  HENT:     Mouth/Throat:     Pharynx:  Oropharynx is clear.  Eyes:     Conjunctiva/sclera: Conjunctivae normal.  Cardiovascular:     Rate and Rhythm: Normal rate and regular rhythm.  Pulmonary:     Effort: Pulmonary effort is normal.  Musculoskeletal:     Cervical back: Normal range of motion.  Skin:    General: Skin is warm and dry.   Neurological:     Mental Status: She is alert and oriented to person, place, and time.     Comments: Answers questions appropriately      UC Treatments / Results  Labs (all labs ordered are listed, but only abnormal results are displayed) Labs Reviewed  POCT URINALYSIS DIP (MANUAL ENTRY) - Abnormal; Notable for the following components:      Result Value   Clarity, UA cloudy (*)    Glucose, UA >=1,000 (*)    Bilirubin, UA small (*)    Ketones, POC UA small (15) (*)    Blood, UA moderate (*)    Protein Ur, POC =30 (*)    All other components within normal limits  POCT FASTING CBG [facility] MANUAL ENTRY - Abnormal; Notable for the following components:   POCT Glucose ([facility]) 473 (*)    All other components within normal limits    EKG  Radiology No results found.  Procedures Procedures (including critical care time)  Medications Ordered in UC Medications - No data to display  Initial Impression / Assessment and Plan / UC Course  I have reviewed the triage vital signs and the nursing notes.  Pertinent labs & imaging results that were available during my care of the patient were reviewed by me and considered in my medical decision making (see chart for details).  CBG 473 UA with some ketones. Lots of glucose.  With patient symptoms and elevated blood glucose I have concern for new onset diabetes. Needs to be evaluated in the emergency department for blood work and medications. Patient would like to transport herself to the hospital. This is reasonable as she is alert and oriented, and the ED is next door to this clinic.  Final Clinical Impressions(s) / UC Diagnoses   Final diagnoses:  Hyperglycemia  Diabetes mellitus, new onset [facility])     Discharge Instructions      Sent to ED      ED Prescriptions   None    PDMP not reviewed this encounter.   Marlow Baars, [place] 10/19/22 1556

## 2022-10-19 NOTE — Discharge Instructions (Addendum)
We evaluated you for your dehydration.  Your laboratory tests show that you probably have new onset type 2 diabetes.  The diabetes coordinator will probably call you in the next few days.  We have started you on a medication called metformin.  Please take this starting tomorrow twice daily.  This can help control your blood sugar.  You may need additional medication so it is very important to follow-up with your primary doctor soon as possible.  Please call them tomorrow morning to set up appointment.  Please keep an eye on your symptoms.  If you develop any recurrent symptoms, such as recurrent severe dehydration, difficulty breathing, chest pain or shortness of breath, fevers or chills, vomiting, or any other concerning symptoms, please return to the emergency department for reevaluation.

## 2022-10-19 NOTE — ED Triage Notes (Signed)
Patient here today with c/o urinary frequency, thirst, sweats, nausea, muscle cramps, and weakness X 5 days. Nothing helps. Has been feeling dizzy today.

## 2022-10-19 NOTE — Discharge Instructions (Addendum)
Sent to ED

## 2022-10-19 NOTE — ED Triage Notes (Signed)
Pt bib POV. Stating she hasn't been feeling well. Pt says she thought she was dehydrated so she has been drinking and peeing a lot. She has been dizzy and experiencing muscle cramping. Pt went to UC where her CBG 479. Pt states no hx DM2

## 2022-10-19 NOTE — ED Provider Notes (Signed)
[facility] Provider Note  CSN: [id number] Arrival date & time: 10/19/22 1601  Chief Complaint(s) No chief complaint on file.  HPI Tiffany Gallagher is a 57 y.o. female history of hypertension, hyperlipidemia presenting to the emergency department with high blood sugar.  Patient reported that over the past week she has been having increased generalized weakness, polyuria, polydipsia, mild shortness of breath, fatigue and muscle aches.  Also having some nausea.  No abdominal pain, chest pain.  No fevers or chills.  No known history of diabetes.  She was noted to have hyperglycemia at urgent care and was sent to the emergency department for further evaluation as she has no known history of diabetes.   Past Medical History Past Medical History:  Diagnosis Date   Allergy    Diverticulitis    Fibroid    Hyperlipidemia    Hypertension    Migraines    Obesity, morbid, BMI 40.0-49.9 (HCC)    Sleep apnea    cpap   Patient Active Problem List   Diagnosis Date Noted   History of total vaginal hysterectomy (TVH) 07/04/2020   Healthcare maintenance 04/26/2020   It band syndrome, right 04/24/2020   Hyperlipidemia 09/19/2019   Mild intermittent asthma without complication 09/19/2019   Hair loss 09/19/2019   Dyspepsia 09/19/2019   Right hip pain 09/19/2019   Leg skin lesion, right 06/09/2017   OSA (obstructive sleep apnea) 06/05/2016   Preventative health care 02/09/2015   Acute bacterial bronchitis 11/15/2014   Severe obesity (BMI >= 40) (HCC) 05/10/2014   Physical exam 01/20/2014   HSV-1 infection 06/20/2013   Migraines    Smoker    Sinusitis 04/11/2011   Leg cramps 09/18/2010   Edema 09/18/2010   VAGINAL DISCHARGE 09/20/2009   KNEE PAIN, BILATERAL 09/20/2009   FLATULENCE ERUCTATION AND GAS PAIN 08/03/2009   HEMATURIA, HX OF 07/04/2009   Essential hypertension 02/07/2008   GERD 11/30/2006   Home Medication(s) Prior to Admission  medications   Medication Sig Start Date End Date Taking? Authorizing Provider  metFORMIN (GLUCOPHAGE) 500 MG tablet Take 1 tablet (500 mg total) by mouth 2 (two) times daily with a meal. 10/19/22  Yes Lonell Grandchild, MD  acyclovir ointment (ZOVIRAX) 5 % Apply three times per day as needed 07/14/22   Olivia Mackie, NP  albuterol (VENTOLIN HFA) 108 (90 Base) MCG/ACT inhaler Inhale 2 puffs into the lungs every 6 (six) hours as needed for wheezing or shortness of breath. 04/28/22   Donato Schultz, DO  Ascorbic Acid (VITAMIN C) 100 MG tablet Take 100 mg by mouth daily.    [provider]  benazepril (LOTENSIN) 20 MG tablet Take 1 tablet (20 mg total) by mouth daily. 04/28/22   Seabron Spates R, DO  CALCIUM PO Take 1 tablet by mouth daily.    [provider]  hydrochlorothiazide (HYDRODIURIL) 25 MG tablet Take 1 tablet (25 mg total) by mouth daily. 04/28/22   Donato Schultz, DO  Multiple Vitamin (MULTIVITAMIN WITH MINERALS) TABS Take 1 tablet by mouth daily.    [provider]  Omega-3 Fatty Acids (FISH OIL PO) Take by mouth.    [provider]  omeprazole (PRILOSEC) 20 MG capsule Take 1 capsule (20 mg total) by mouth daily. 04/28/22 09/04/24  Donato Schultz, DO  Orlistat (ALLI PO) Take 1 capsule by mouth daily.    [provider]  thiamine (VITAMIN B-1) 100 MG tablet  Take 100 mg by mouth daily.    [provider]  VITAMIN D PO Take 4,000 Units by mouth daily.    [provider]                                                                                                                                    Past Surgical History Past Surgical History:  Procedure Laterality Date   bone spur     removed   CESAREAN SECTION  1995   VAGINAL HYSTERECTOMY  1998   partial   Family History Family History  Problem Relation Age of Onset   Coronary artery disease Mother    Hypertension Mother    Kidney disease Mother  40       dialysis   Aneurysm Mother        brain   Stroke Mother    Heart disease Mother        MI   Cancer Father 29       brain cancer   Diabetes Sister    Hyperlipidemia Sister    Hypertension Sister    Hypertension Sister    Arthritis Brother    Hypertension Brother    Diabetes Brother    Hypertension Brother    Heart attack Brother     Social History Social History   Tobacco Use   Smoking status: Every Day    Packs/day: 0.50    Years: 32.00    Additional pack years: 0.00    Total pack years: 16.00    Types: Cigarettes   Smokeless tobacco: Never   Tobacco comments:    cutting back  Vaping Use   Vaping Use: Never used  Substance Use Topics   Alcohol use: Yes    Comment: Occas wine   Drug use: Yes    Types: Marijuana    Comment: occ   Allergies Egg-derived products, Lactose intolerance (gi), Valtrex [valacyclovir], and Lipitor [atorvastatin calcium]  Review of Systems Review of Systems  All other systems reviewed and are negative.   Physical Exam Vital Signs  I have reviewed the triage vital signs BP (!) 166/118 (BP Location: Right Arm)   Pulse (!) 110   Temp 98.6 F (37 C) (Oral)   Resp 20   Ht 5\' 3"  (1.6 m)   Wt (!) 139.3 kg   SpO2 96%   BMI 54.38 kg/m  Physical Exam Vitals and nursing note reviewed.  Constitutional:      General: She is not in acute distress.    Appearance: She is well-developed.  HENT:     Head: Normocephalic and atraumatic.     Mouth/Throat:     Mouth: Mucous membranes are dry.  Eyes:     Pupils: Pupils are equal, round, and reactive to light.  Cardiovascular:     Rate and Rhythm: Regular rhythm. Tachycardia present.     Heart sounds: No murmur heard. Pulmonary:  Effort: Pulmonary effort is normal. No respiratory distress.     Breath sounds: Normal breath sounds.  Abdominal:     General: Abdomen is flat.     Palpations: Abdomen is soft.     Tenderness: There is no abdominal tenderness.  Musculoskeletal:         General: No tenderness.     Right lower leg: No edema.     Left lower leg: No edema.  Skin:    General: Skin is warm and dry.  Neurological:     General: No focal deficit present.     Mental Status: She is alert. Mental status is at baseline.  Psychiatric:        Mood and Affect: Mood normal.        Behavior: Behavior normal.     ED Results and Treatments Labs (all labs ordered are listed, but only abnormal results are displayed) Labs Reviewed  BASIC METABOLIC PANEL - Abnormal; Notable for the following components:      Result Value   Sodium 132 (*)    Chloride 93 (*)    Glucose, Bld 461 (*)    All other components within normal limits  CBC - Abnormal; Notable for the following components:   Hemoglobin 15.7 (*)    All other components within normal limits  URINALYSIS, ROUTINE W REFLEX MICROSCOPIC - Abnormal; Notable for the following components:   APPearance HAZY (*)    Specific Gravity, Urine 1.036 (*)    Glucose, UA >=500 (*)    Ketones, ur 20 (*)    Protein, ur 100 (*)    Bacteria, UA RARE (*)    All other components within normal limits  MAGNESIUM - Abnormal; Notable for the following components:   Magnesium 1.6 (*)    All other components within normal limits  CBG MONITORING, ED - Abnormal; Notable for the following components:   Glucose-Capillary 459 (*)    All other components within normal limits  CBG MONITORING, ED - Abnormal; Notable for the following components:   Glucose-Capillary 370 (*)    All other components within normal limits  I-STAT VENOUS BLOOD GAS, ED - Abnormal; Notable for the following components:   pCO2, Ven 43.8 (*)    Sodium 134 (*)    HCT 48.0 (*)    Hemoglobin 16.3 (*)    All other components within normal limits  I-STAT CHEM 8, ED - Abnormal; Notable for the following components:   Chloride 96 (*)    Glucose, Bld 470 (*)    Hemoglobin 16.7 (*)    HCT 49.0 (*)    All other components within normal limits  CBG MONITORING, ED -  Abnormal; Notable for the following components:   Glucose-Capillary 277 (*)    All other components within normal limits  CK  CBG MONITORING, ED  Radiology DG Chest Port 1 View  Result Date: 10/19/2022 CLINICAL DATA:  Chest pain EXAM: PORTABLE CHEST 1 VIEW COMPARISON:  03/19/2015 FINDINGS: The heart size and mediastinal contours are within normal limits. Aortic atherosclerosis. Both lungs are clear. The visualized skeletal structures are unremarkable. IMPRESSION: No active disease. Electronically Signed   By: Jasmine Pang M.D.   On: 10/19/2022 17:19    Pertinent labs & imaging results that were available during my care of the patient were reviewed by me and considered in my medical decision making (see MDM for details).  Medications Ordered in ED Medications  sodium chloride 0.9 % bolus 1,000 mL (0 mLs Intravenous Stopped 10/19/22 1836)  diazepam (VALIUM) tablet 2 mg (2 mg Oral Given 10/19/22 1702)  acetaminophen (TYLENOL) tablet 1,000 mg (1,000 mg Oral Given 10/19/22 1702)  insulin aspart (novoLOG) injection 10 Units (10 Units Intravenous Given 10/19/22 1954)  magnesium sulfate IVPB 2 g 50 mL (2 g Intravenous New Bag/Given 10/19/22 2001)                                                                                                                                     Procedures Procedures  (including critical care time)  Medical Decision Making / ED Course   MDM:  57 year old presenting to the emergency department with high blood sugar.  Patient overall well-appearing, vitals notable for some tachycardia.  Patient also somewhat hypertensive.  Dry mucous membranes  Suspect symptoms likely due to new onset diabetes.  Given her symptoms of shortness of breath, nausea considered DKA but no gap on labs and CO2 is normal.  Will give insulin, fluids.  Patient will need to  start diabetic medication.  She is also having muscle cramps, electrolytes are reassuring, could be due to dehydration, will check CK to rule out rhabdomyolysis.  She denies any specific infectious symptoms such as cough, dysuria, abdominal pain to suggest any underlying infection.  Will reassess. Clinical Course as of 10/19/22 2109  Wynelle Link Oct 19, 2022  2107 Sugar improved after IV fluids, insulin.  Patient reports feeling better.  Labs without evidence of DKA.  Did have mild hypomagnesemia which was repleted.  CK normal.  Discussed new diagnosis of likely diabetes.  Advise close follow-up with her primary doctor.  Will prescribe metformin.  Diabetes coordinator was notified and will likely try to call the patient over the next few days. Will discharge patient to home. All questions answered. Patient comfortable with plan of discharge. Return precautions discussed with patient and specified on the after visit summary.  [WS]    Clinical Course User Index [WS] Lonell Grandchild, MD     Additional history obtained:  -External records from outside source obtained and reviewed including: Chart review including previous notes, labs, imaging, consultation notes including UC note today   Lab Tests: -I ordered, reviewed, and interpreted labs.   The pertinent results include:   Labs Reviewed  BASIC METABOLIC PANEL - Abnormal; Notable for the following components:      Result Value   Sodium 132 (*)    Chloride 93 (*)    Glucose, Bld 461 (*)    All other components within normal limits  CBC - Abnormal; Notable for the following components:   Hemoglobin 15.7 (*)    All other components within normal limits  URINALYSIS, ROUTINE W REFLEX MICROSCOPIC - Abnormal; Notable for the following components:   APPearance HAZY (*)    Specific Gravity, Urine 1.036 (*)    Glucose, UA >=500 (*)    Ketones, ur 20 (*)    Protein, ur 100 (*)    Bacteria, UA RARE (*)    All other components within normal limits   MAGNESIUM - Abnormal; Notable for the following components:   Magnesium 1.6 (*)    All other components within normal limits  CBG MONITORING, ED - Abnormal; Notable for the following components:   Glucose-Capillary 459 (*)    All other components within normal limits  CBG MONITORING, ED - Abnormal; Notable for the following components:   Glucose-Capillary 370 (*)    All other components within normal limits  I-STAT VENOUS BLOOD GAS, ED - Abnormal; Notable for the following components:   pCO2, Ven 43.8 (*)    Sodium 134 (*)    HCT 48.0 (*)    Hemoglobin 16.3 (*)    All other components within normal limits  I-STAT CHEM 8, ED - Abnormal; Notable for the following components:   Chloride 96 (*)    Glucose, Bld 470 (*)    Hemoglobin 16.7 (*)    HCT 49.0 (*)    All other components within normal limits  CBG MONITORING, ED - Abnormal; Notable for the following components:   Glucose-Capillary 277 (*)    All other components within normal limits  CK  CBG MONITORING, ED    Notable for hyperglycemia, hypomagnesemia, no anion gap or acidosis  EKG   EKG Interpretation Date/Time:  Sunday October 19 2022 16:10:31 EDT Ventricular Rate:  125 PR Interval:  127 QRS Duration:  85 QT Interval:  351 QTC Calculation: 507 R Axis:   -34  Text Interpretation: Sinus tachycardia Consider right atrial enlargement Left axis deviation Abnormal R-wave progression, late transition Borderline prolonged QT interval Confirmed by Alvino Blood ([id number]) on 10/19/2022 4:33:19 PM         Imaging Studies ordered: I ordered imaging studies including CXR On my interpretation imaging demonstrates no acute process I independently visualized and interpreted imaging. I agree with the radiologist interpretation   Medicines ordered and prescription drug management: Meds ordered this encounter  Medications   sodium chloride 0.9 % bolus 1,000 mL   diazepam (VALIUM) tablet 2 mg   acetaminophen (TYLENOL)  tablet 1,000 mg   insulin aspart (novoLOG) injection 10 Units   magnesium sulfate IVPB 2 g 50 mL   metFORMIN (GLUCOPHAGE) 500 MG tablet    Sig: Take 1 tablet (500 mg total) by mouth 2 (two) times daily with a meal.    Dispense:  60 tablet    Refill:  0    -I have reviewed the patients home medicines and have made adjustments as needed    Cardiac Monitoring: The patient was maintained on a cardiac monitor.  I personally viewed and interpreted the cardiac monitored which showed an underlying rhythm of: sinus tachycardia improving to normal sinus rhythm   Social Determinants of Health:  Diagnosis or treatment significantly limited  by social determinants of health: obesity   Reevaluation: After the interventions noted above, I reevaluated the patient and found that their symptoms have improved  Co morbidities that complicate the patient evaluation  Past Medical History:  Diagnosis Date   Allergy    Diverticulitis    Fibroid    Hyperlipidemia    Hypertension    Migraines    Obesity, morbid, BMI 40.0-49.9 (HCC)    Sleep apnea    cpap      Dispostion: Disposition decision including need for hospitalization was considered, and patient discharged from emergency department.    Final Clinical Impression(s) / ED Diagnoses Final diagnoses:  Hyperglycemia due to diabetes mellitus (HCC)     This chart was dictated using voice recognition software.  Despite best efforts to proofread,  errors can occur which can change the documentation meaning.    Lonell Grandchild, MD 10/19/22 2109

## 2022-10-20 ENCOUNTER — Encounter: Payer: Self-pay | Admitting: Family Medicine

## 2022-10-20 ENCOUNTER — Ambulatory Visit (INDEPENDENT_AMBULATORY_CARE_PROVIDER_SITE_OTHER): Payer: BC Managed Care – PPO | Admitting: Family Medicine

## 2022-10-20 VITALS — BP 104/80 | HR 102 | Temp 98.6°F | Resp 18 | Ht 63.0 in | Wt 293.4 lb

## 2022-10-20 DIAGNOSIS — Z7984 Long term (current) use of oral hypoglycemic drugs: Secondary | ICD-10-CM | POA: Diagnosis not present

## 2022-10-20 DIAGNOSIS — E785 Hyperlipidemia, unspecified: Secondary | ICD-10-CM | POA: Diagnosis not present

## 2022-10-20 DIAGNOSIS — R739 Hyperglycemia, unspecified: Secondary | ICD-10-CM

## 2022-10-20 DIAGNOSIS — Z794 Long term (current) use of insulin: Secondary | ICD-10-CM

## 2022-10-20 DIAGNOSIS — E1165 Type 2 diabetes mellitus with hyperglycemia: Secondary | ICD-10-CM

## 2022-10-20 LAB — GLUCOSE, POCT (MANUAL RESULT ENTRY): POC Glucose: 459 mg/dl — AB (ref 70–99)

## 2022-10-20 MED ORDER — METFORMIN HCL 1000 MG PO TABS
1000.0000 mg | ORAL_TABLET | Freq: Two times a day (BID) | ORAL | 3 refills | Status: DC
Start: 2022-10-20 — End: 2023-02-27

## 2022-10-20 MED ORDER — PITAVASTATIN CALCIUM 2 MG PO TABS
2.0000 mg | ORAL_TABLET | Freq: Every day | ORAL | 1 refills | Status: AC
Start: 2022-10-20 — End: ?

## 2022-10-20 MED ORDER — LANCETS MISC. MISC
1.0000 | Freq: Three times a day (TID) | 0 refills | Status: AC
Start: 2022-10-20 — End: 2022-11-19

## 2022-10-20 MED ORDER — LANCET DEVICE MISC
1.0000 | Freq: Three times a day (TID) | 0 refills | Status: AC
Start: 2022-10-20 — End: 2022-11-19

## 2022-10-20 MED ORDER — INSULIN PEN NEEDLE 32G X 6 MM MISC
1 refills | Status: DC
Start: 2022-10-20 — End: 2023-02-16

## 2022-10-20 MED ORDER — BLOOD GLUCOSE TEST VI STRP
1.0000 | ORAL_STRIP | Freq: Three times a day (TID) | 0 refills | Status: DC
Start: 2022-10-20 — End: 2022-10-28

## 2022-10-20 MED ORDER — NOVOLOG FLEXPEN 100 UNIT/ML ~~LOC~~ SOPN
PEN_INJECTOR | SUBCUTANEOUS | 11 refills | Status: DC
Start: 2022-10-20 — End: 2023-11-12

## 2022-10-20 MED ORDER — BLOOD GLUCOSE MONITORING SUPPL DEVI
1.00 | Freq: Three times a day (TID) | 0 refills | Status: AC
Start: 2022-10-20 — End: ?

## 2022-10-20 NOTE — Assessment & Plan Note (Signed)
Pt given 10 u reg insulin in office  And a sliding scale  Inc metformin 1000 mg bid  Refer to endo

## 2022-10-20 NOTE — Progress Notes (Signed)
Established Patient Office Visit  Subjective   Patient ID: Tiffany Gallagher, female    DOB: 1966-01-28  Age: 57 y.o. MRN: [id number]  Chief Complaint  Patient presents with   ER follow up    Pt was seen in the ED yesterday. Pt states sxs resolved while at the hospital. Sxs started back this morning. Pt states having dizziness, sweating, sugar was 563 at 1156 this morning.     HPI Pt is here to f/u er for hyperglycemia--- bs in er >500  she had been feeling fatigued, weak since last week and it worsened so she went to the er yesterday.  She was given 10 u insulin and started on metformin 500 mg bid and sent home.  She felt much better but this am she started feeling bad again so she called for an appointent  Patient Active Problem List   Diagnosis Date Noted   Type 2 diabetes mellitus with hyperglycemia, without long-term current use of insulin (HCC) 10/20/2022   History of total vaginal hysterectomy (TVH) 07/04/2020   Healthcare maintenance 04/26/2020   It band syndrome, right 04/24/2020   Hyperlipidemia 09/19/2019   Mild intermittent asthma without complication 09/19/2019   Hair loss 09/19/2019   Dyspepsia 09/19/2019   Right hip pain 09/19/2019   Leg skin lesion, right 06/09/2017   OSA (obstructive sleep apnea) 06/05/2016   Preventative health care 02/09/2015   Acute bacterial bronchitis 11/15/2014   Severe obesity (BMI >= 40) (HCC) 05/10/2014   Physical exam 01/20/2014   HSV-1 infection 06/20/2013   Migraines    Smoker    Sinusitis 04/11/2011   Leg cramps 09/18/2010   Edema 09/18/2010   VAGINAL DISCHARGE 09/20/2009   KNEE PAIN, BILATERAL 09/20/2009   FLATULENCE ERUCTATION AND GAS PAIN 08/03/2009   HEMATURIA, HX OF 07/04/2009   Essential hypertension 02/07/2008   GERD 11/30/2006   Past Medical History:  Diagnosis Date   Allergy    Diverticulitis    Fibroid    Hyperlipidemia    Hypertension    Migraines    Obesity, morbid, BMI 40.0-49.9 (HCC)    Sleep apnea     cpap   Past Surgical History:  Procedure Laterality Date   bone spur     removed   CESAREAN SECTION  1995   VAGINAL HYSTERECTOMY  1998   partial   Social History   Tobacco Use   Smoking status: Every Day    Packs/day: 0.50    Years: 32.00    Additional pack years: 0.00    Total pack years: 16.00    Types: Cigarettes   Smokeless tobacco: Never   Tobacco comments:    cutting back  Vaping Use   Vaping Use: Never used  Substance Use Topics   Alcohol use: Yes    Comment: Occas wine   Drug use: Yes    Types: Marijuana    Comment: occ   Social History   Socioeconomic History   Marital status: Divorced    Spouse name: Not on file   Number of children: 1   Years of education: 17   Highest education level: Not on file  Occupational History   Occupation: customer service   Tobacco Use   Smoking status: Every Day    Packs/day: 0.50    Years: 32.00    Additional pack years: 0.00    Total pack years: 16.00    Types: Cigarettes   Smokeless tobacco: Never   Tobacco comments:    cutting back  Vaping Use   Vaping Use: Never used  Substance and Sexual Activity   Alcohol use: Yes    Comment: Occas wine   Drug use: Yes    Types: Marijuana    Comment: occ   Sexual activity: Not Currently    Partners: Male    Birth control/protection: Surgical, Condom  Other Topics Concern   Not on file  Social History Narrative   Exercise-- just starting to walk 4 -7 days a week , yoga    Social Determinants of Health   Financial Resource Strain: Not on file  Food Insecurity: Not on file  Transportation Needs: Not on file  Physical Activity: Not on file  Stress: Not on file  Social Connections: Not on file  Intimate Partner Violence: Not on file   Family Status  Relation Name Status   Mother  Deceased   Father  Deceased   Sister theresa Alive   Sister  Alive   Brother victor Alive   Brother willie Alive   Family History  Problem Relation Age of Onset   Coronary  artery disease Mother    Hypertension Mother    Kidney disease Mother 61       dialysis   Aneurysm Mother        brain   Stroke Mother    Heart disease Mother        MI   Cancer Father 58       brain cancer   Diabetes Sister    Hyperlipidemia Sister    Hypertension Sister    Hypertension Sister    Arthritis Brother    Hypertension Brother    Diabetes Brother    Hypertension Brother    Heart attack Brother    Allergies  Allergen Reactions   Egg-Derived Products    Lactose Intolerance (Gi)    Valtrex [Valacyclovir]     itching   Lipitor [Atorvastatin Calcium]     Nerves, muscle spasms, light stool      Review of Systems  Constitutional:  Negative for fever and malaise/fatigue.  HENT:  Negative for congestion.   Eyes:  Negative for blurred vision.  Respiratory:  Negative for shortness of breath.   Cardiovascular:  Negative for chest pain, palpitations and leg swelling.  Gastrointestinal:  Negative for abdominal pain, blood in stool and nausea.  Genitourinary:  Negative for dysuria and frequency.  Musculoskeletal:  Negative for falls.  Skin:  Negative for rash.  Neurological:  Negative for dizziness, loss of consciousness and headaches.  Endo/Heme/Allergies:  Negative for environmental allergies.  Psychiatric/Behavioral:  Negative for depression. The patient is not nervous/anxious.       Objective:     BP 104/80 (BP Location: Left Arm, Patient Position: Sitting, Cuff Size: Large)   Pulse (!) 102   Temp 98.6 F (37 C) (Oral)   Resp 18   Ht 5\' 3"  (1.6 m)   Wt 293 lb 6.4 oz (133.1 kg)   SpO2 95%   BMI 51.97 kg/m  BP Readings from Last 3 Encounters:  10/20/22 104/80  10/19/22 119/67  10/19/22 122/89   Wt Readings from Last 3 Encounters:  10/20/22 293 lb 6.4 oz (133.1 kg)  10/19/22 (!) 307 lb (139.3 kg)  10/19/22 (!) 307 lb (139.3 kg)   SpO2 Readings from Last 3 Encounters:  10/20/22 95%  10/19/22 96%  10/19/22 94%      Physical Exam Vitals and  nursing note reviewed.  Constitutional:      General: She is  not in acute distress.    Appearance: Normal appearance. She is well-developed.  HENT:     Head: Normocephalic and atraumatic.  Eyes:     General: No scleral icterus.       Right eye: No discharge.        Left eye: No discharge.  Cardiovascular:     Rate and Rhythm: Normal rate and regular rhythm.     Heart sounds: No murmur heard. Pulmonary:     Effort: Pulmonary effort is normal. No respiratory distress.     Breath sounds: Normal breath sounds.  Musculoskeletal:        General: Normal range of motion.     Cervical back: Normal range of motion and neck supple.     Right lower leg: No edema.     Left lower leg: No edema.  Skin:    General: Skin is warm and dry.  Neurological:     General: No focal deficit present.     Mental Status: She is alert and oriented to person, place, and time.  Psychiatric:        Mood and Affect: Mood normal.        Behavior: Behavior normal.        Thought Content: Thought content normal.        Judgment: Judgment normal.      Results for orders placed or performed in visit on 10/20/22  POCT glucose (manual entry)  Result Value Ref Range   POC Glucose 459 (A) 70 - 99 mg/dl    Last CBC Lab Results  Component Value Date   WBC 8.1 10/19/2022   HGB 16.3 (H) 10/19/2022   HGB 16.7 (H) 10/19/2022   HCT 48.0 (H) 10/19/2022   HCT 49.0 (H) 10/19/2022   MCV 90.5 10/19/2022   MCH 31.1 10/19/2022   RDW 12.7 10/19/2022   PLT 280 10/19/2022   Last metabolic panel Lab Results  Component Value Date   GLUCOSE 470 (H) 10/19/2022   NA 134 (L) 10/19/2022   NA 135 10/19/2022   K 3.9 10/19/2022   K 3.9 10/19/2022   CL 96 (L) 10/19/2022   CO2 24 10/19/2022   BUN 14 10/19/2022   CREATININE 0.80 10/19/2022   GFRNONAA >60 10/19/2022   CALCIUM 9.6 10/19/2022   PHOS 2.9 09/18/2010   PROT 6.7 08/05/2022   ALBUMIN 3.9 08/05/2022   BILITOT 0.5 08/05/2022   ALKPHOS 67 08/05/2022   AST 13  08/05/2022   ALT 12 08/05/2022   ANIONGAP 15 10/19/2022   Last lipids Lab Results  Component Value Date   CHOL 215 (H) 08/05/2022   HDL 44.20 08/05/2022   LDLCALC 143 (H) 08/05/2022   LDLDIRECT 151.6 01/18/2013   TRIG 138.0 08/05/2022   CHOLHDL 5 08/05/2022   Last hemoglobin A1c Lab Results  Component Value Date   HGBA1C 5.6 04/25/2021   Last thyroid functions Lab Results  Component Value Date   TSH 2.70 04/28/2022   T4TOTAL 9.2 09/16/2019   Last vitamin D Lab Results  Component Value Date   VD25OH 25.30 (L) 04/25/2021   Last vitamin B12 and Folate Lab Results  Component Value Date   VITAMINB12 174 (L) 09/16/2019   FOLATE 6.0 09/16/2019      The 10-year ASCVD risk score (Arnett DK, et al., 2019) is: 15.4%    Assessment & Plan:   Problem List Items Addressed This Visit       Unprioritized   Hyperlipidemia   Relevant Medications  Pitavastatin Calcium 2 MG TABS   Other Relevant Orders   Lipid panel   Comprehensive metabolic panel   Type 2 diabetes mellitus with hyperglycemia, without long-term current use of insulin (HCC) - Primary    Pt given 10 u reg insulin in office  And a sliding scale  Inc metformin 1000 mg bid  Refer to endo       Relevant Medications   Pitavastatin Calcium 2 MG TABS   Blood Glucose Monitoring Suppl DEVI   Glucose Blood (BLOOD GLUCOSE TEST STRIPS) STRP   Lancet Device MISC   Lancets Misc. MISC   metFORMIN (GLUCOPHAGE) 1000 MG tablet   insulin aspart (NOVOLOG FLEXPEN) 100 UNIT/ML FlexPen   Insulin Pen Needle 32G X 6 MM MISC   Other Relevant Orders   Hemoglobin A1c   Microalbumin / creatinine urine ratio   POCT Urinalysis Dipstick (Automated)   Ambulatory referral to Endocrinology   Other Visit Diagnoses     Hyperglycemia       Relevant Orders   POCT glucose (manual entry) (Completed)   CBC with Differential/Platelet       No follow-ups on file.    Donato Schultz, DO

## 2022-10-20 NOTE — Patient Instructions (Addendum)
Check blood sugars 4x a day     Carbohydrate Counting for Diabetes Mellitus, Adult Carbohydrate counting is a method of keeping track of how many carbohydrates you eat. Eating carbohydrates increases the amount of sugar (glucose) in the blood. Counting how many carbohydrates you eat improves how well you manage your blood glucose. This, in turn, helps you manage your diabetes. Carbohydrates are measured in grams (g) per serving. It is important to know how many carbohydrates (in grams or by serving size) you can have in each meal. This is different for every person. A dietitian can help you make a meal plan and calculate how many carbohydrates you should have at each meal and snack. What foods contain carbohydrates? Carbohydrates are found in the following foods: Grains, such as breads and cereals. Dried beans and soy products. Starchy vegetables, such as potatoes, peas, and corn. Fruit and fruit juices. Milk and yogurt. Sweets and snack foods, such as cake, cookies, candy, chips, and soft drinks. How do I count carbohydrates in foods? There are two ways to count carbohydrates in food. You can read food labels or learn standard serving sizes of foods. You can use either of these methods or a combination of both. Using the Nutrition Facts label The Nutrition Facts list is included on the labels of almost all packaged foods and beverages in the [place]. It includes: The serving size. Information about nutrients in each serving, including the grams of carbohydrate per serving. To use the Nutrition Facts, decide how many servings you will have. Then, multiply the number of servings by the number of carbohydrates per serving. The resulting number is the total grams of carbohydrates that you will be having. Learning the standard serving sizes of foods When you eat carbohydrate foods that are not packaged or do not include Nutrition Facts on the label, you need to measure the servings in order  to count the grams of carbohydrates. Measure the foods that you will eat with a food scale or measuring cup, if needed. Decide how many standard-size servings you will eat. Multiply the number of servings by 15. For foods that contain carbohydrates, one serving equals 15 g of carbohydrates. For example, if you eat 2 cups or 10 oz (300 g) of strawberries, you will have eaten 2 servings and 30 g of carbohydrates (2 servings x 15 g = 30 g). For foods that have more than one food mixed, such as soups and casseroles, you must count the carbohydrates in each food that is included. The following list contains standard serving sizes of common carbohydrate-rich foods. Each of these servings has about 15 g of carbohydrates: 1 slice of bread. 1 six-inch (15 cm) tortilla. ? cup or 2 oz (53 g) cooked rice or pasta.  cup or 3 oz (85 g) cooked or canned, drained and rinsed beans or lentils.  cup or 3 oz (85 g) starchy vegetable, such as peas, corn, or squash.  cup or 4 oz (120 g) hot cereal.  cup or 3 oz (85 g) boiled or mashed potatoes, or  or 3 oz (85 g) of a large baked potato.  cup or 4 fl oz (118 mL) fruit juice. 1 cup or 8 fl oz (237 mL) milk. 1 small or 4 oz (106 g) apple.  or 2 oz (63 g) of a medium banana. 1 cup or 5 oz (150 g) strawberries. 3 cups or 1 oz (28.3 g) popped popcorn. What is an example of carbohydrate counting? To calculate the  grams of carbohydrates in this sample meal, follow the steps shown below. Sample meal 3 oz (85 g) chicken breast. ? cup or 4 oz (106 g) brown rice.  cup or 3 oz (85 g) corn. 1 cup or 8 fl oz (237 mL) milk. 1 cup or 5 oz (150 g) strawberries with sugar-free whipped topping. Carbohydrate calculation Identify the foods that contain carbohydrates: Rice. Corn. Milk. Strawberries. Calculate how many servings you have of each food: 2 servings rice. 1 serving corn. 1 serving milk. 1 serving strawberries. Multiply each number of servings by 15  g: 2 servings rice x 15 g = 30 g. 1 serving corn x 15 g = 15 g. 1 serving milk x 15 g = 15 g. 1 serving strawberries x 15 g = 15 g. Add together all of the amounts to find the total grams of carbohydrates eaten: 30 g + 15 g + 15 g + 15 g = 75 g of carbohydrates total. What are tips for following this plan? Shopping Develop a meal plan and then make a shopping list. Buy fresh and frozen vegetables, fresh and frozen fruit, dairy, eggs, beans, lentils, and whole grains. Look at food labels. Choose foods that have more fiber and less sugar. Avoid processed foods and foods with added sugars. Meal planning Aim to have the same number of grams of carbohydrates at each meal and for each snack time. Plan to have regular, balanced meals and snacks. Where to find more information American Diabetes Association: diabetes.org Centers for Disease Control and Prevention: TonerPromos.no Academy of Nutrition and Dietetics: eatright.org Association of Diabetes Care & Education Specialists: diabeteseducator.org Summary Carbohydrate counting is a method of keeping track of how many carbohydrates you eat. Eating carbohydrates increases the amount of sugar (glucose) in your blood. Counting how many carbohydrates you eat improves how well you manage your blood glucose. This helps you manage your diabetes. A dietitian can help you make a meal plan and calculate how many carbohydrates you should have at each meal and snack. This information is not intended to replace advice given to you by your health care provider. Make sure you discuss any questions you have with your health care provider. Document Revised: 11/09/2019 Document Reviewed: 11/09/2019 Elsevier Patient Education  2024 [employer].

## 2022-10-21 ENCOUNTER — Telehealth: Payer: Self-pay | Admitting: Family Medicine

## 2022-10-21 ENCOUNTER — Other Ambulatory Visit: Payer: Self-pay

## 2022-10-21 ENCOUNTER — Other Ambulatory Visit: Payer: Self-pay | Admitting: Family Medicine

## 2022-10-21 LAB — COMPREHENSIVE METABOLIC PANEL
ALT: 19 U/L (ref 0–35)
AST: 16 U/L (ref 0–37)
Albumin: 4.1 g/dL (ref 3.5–5.2)
Alkaline Phosphatase: 84 U/L (ref 39–117)
BUN: 19 mg/dL (ref 6–23)
CO2: 25 mEq/L (ref 19–32)
Calcium: 10.1 mg/dL (ref 8.4–10.5)
Chloride: 98 mEq/L (ref 96–112)
Creatinine, Ser: 0.88 mg/dL (ref 0.40–1.20)
GFR: 73.24 mL/min (ref >60.00)
Glucose, Bld: 469 mg/dL — ABNORMAL HIGH (ref 70–99)
Potassium: 4 mEq/L (ref 3.5–5.1)
Sodium: 133 mEq/L — ABNORMAL LOW (ref 135–145)
Total Bilirubin: 0.6 mg/dL (ref 0.2–1.2)
Total Protein: 7.4 g/dL (ref 6.0–8.3)

## 2022-10-21 LAB — LIPID PANEL
Cholesterol: 242 mg/dL — ABNORMAL HIGH (ref 0–200)
HDL: 37.6 mg/dL — ABNORMAL LOW (ref >39.00)
NonHDL: 204.47
Total CHOL/HDL Ratio: 6
Triglycerides: 241 mg/dL — ABNORMAL HIGH (ref 0.0–149.0)
VLDL: 48.2 mg/dL — ABNORMAL HIGH (ref 0.0–40.0)

## 2022-10-21 LAB — MICROALBUMIN / CREATININE URINE RATIO
Creatinine,U: 81.8 mg/dL
Microalb Creat Ratio: 7.5 mg/g (ref 0.0–30.0)
Microalb, Ur: 6.1 mg/dL — ABNORMAL HIGH (ref 0.0–1.9)

## 2022-10-21 LAB — LDL CHOLESTEROL, DIRECT: Direct LDL: 172 mg/dL

## 2022-10-21 LAB — CBC WITH DIFFERENTIAL/PLATELET
Basophils Absolute: 0.1 10*3/uL (ref 0.0–0.1)
Basophils Relative: 0.8 % (ref 0.0–3.0)
Eosinophils Absolute: 0.1 10*3/uL (ref 0.0–0.7)
Eosinophils Relative: 1.1 % (ref 0.0–5.0)
HCT: 43 % (ref 36.0–46.0)
Hemoglobin: 14.1 g/dL (ref 12.0–15.0)
Lymphocytes Relative: 44.2 % (ref 12.0–46.0)
Lymphs Abs: 3 10*3/uL (ref 0.7–4.0)
MCHC: 32.8 g/dL (ref 30.0–36.0)
MCV: 93.4 fl (ref 78.0–100.0)
Monocytes Absolute: 0.4 10*3/uL (ref 0.1–1.0)
Monocytes Relative: 6.5 % (ref 3.0–12.0)
Neutro Abs: 3.2 10*3/uL (ref 1.4–7.7)
Neutrophils Relative %: 47.4 % (ref 43.0–77.0)
Platelets: 267 10*3/uL (ref 150.0–400.0)
RBC: 4.61 Mil/uL (ref 3.87–5.11)
RDW: 13.9 % (ref 11.5–15.5)
WBC: 6.8 10*3/uL (ref 4.0–10.5)

## 2022-10-21 LAB — HEMOGLOBIN A1C: Hgb A1c MFr Bld: 10.5 % — ABNORMAL HIGH (ref 4.6–6.5)

## 2022-10-21 MED ORDER — TRESIBA FLEXTOUCH 100 UNIT/ML ~~LOC~~ SOPN: 10.0000 [IU] | PEN_INJECTOR | Freq: Every day | SUBCUTANEOUS | 2 refills | Status: DC

## 2022-10-21 NOTE — Telephone Encounter (Signed)
Tiffany Gallagher not covered, please advise.

## 2022-10-21 NOTE — Telephone Encounter (Signed)
Patient was told by provider to call office if blood sugar exceeded 400, patient called today to inform Dr. Laury Gallagher it is at 64.

## 2022-10-21 NOTE — Telephone Encounter (Signed)
Pt called. Pt verbalized understanding. Med sent in

## 2022-10-22 ENCOUNTER — Encounter: Payer: Self-pay | Admitting: Family Medicine

## 2022-10-22 DIAGNOSIS — E785 Hyperlipidemia, unspecified: Secondary | ICD-10-CM

## 2022-10-22 DIAGNOSIS — E1165 Type 2 diabetes mellitus with hyperglycemia: Secondary | ICD-10-CM

## 2022-10-22 MED ORDER — TRESIBA FLEXTOUCH 100 UNIT/ML ~~LOC~~ SOPN: 10.0000 [IU] | PEN_INJECTOR | Freq: Every day | SUBCUTANEOUS | 2 refills | Status: DC

## 2022-10-22 NOTE — Addendum Note (Signed)
Addended by: Roxanne Gates on: 10/22/2022 02:10 PM   Modules accepted: Orders

## 2022-10-24 ENCOUNTER — Other Ambulatory Visit: Payer: Self-pay | Admitting: Family Medicine

## 2022-10-24 DIAGNOSIS — E1165 Type 2 diabetes mellitus with hyperglycemia: Secondary | ICD-10-CM

## 2022-10-27 NOTE — Telephone Encounter (Signed)
See duplicate telephone encounter.

## 2022-10-28 ENCOUNTER — Other Ambulatory Visit: Payer: Self-pay

## 2022-10-28 MED ORDER — BLOOD GLUCOSE TEST VI STRP
1.0000 | ORAL_STRIP | Freq: Three times a day (TID) | 2 refills | Status: DC
Start: 2022-10-28 — End: 2022-11-03

## 2022-10-28 MED ORDER — LANTUS SOLOSTAR 100 UNIT/ML ~~LOC~~ SOPN: 15.0000 [IU] | PEN_INJECTOR | Freq: Every day | SUBCUTANEOUS | 2 refills | Status: DC

## 2022-10-28 NOTE — Addendum Note (Signed)
Addended by: Roxanne Gates on: 10/28/2022 02:26 PM   Modules accepted: Orders

## 2022-11-03 MED ORDER — BLOOD GLUCOSE TEST VI STRP
1.0000 | ORAL_STRIP | Freq: Every day | 2 refills | Status: DC
Start: 2022-11-03 — End: 2023-04-13

## 2022-11-03 NOTE — Addendum Note (Signed)
Addended by: Roxanne Gates on: 11/03/2022 09:19 AM   Modules accepted: Orders

## 2022-11-07 DIAGNOSIS — H524 Presbyopia: Secondary | ICD-10-CM | POA: Diagnosis not present

## 2022-11-07 DIAGNOSIS — H5203 Hypermetropia, bilateral: Secondary | ICD-10-CM | POA: Diagnosis not present

## 2022-11-07 DIAGNOSIS — E119 Type 2 diabetes mellitus without complications: Secondary | ICD-10-CM | POA: Diagnosis not present

## 2022-11-07 DIAGNOSIS — H52223 Regular astigmatism, bilateral: Secondary | ICD-10-CM | POA: Diagnosis not present

## 2022-12-06 DIAGNOSIS — G4733 Obstructive sleep apnea (adult) (pediatric): Secondary | ICD-10-CM | POA: Diagnosis not present

## 2022-12-10 ENCOUNTER — Telehealth (INDEPENDENT_AMBULATORY_CARE_PROVIDER_SITE_OTHER): Payer: BC Managed Care – PPO | Admitting: Internal Medicine

## 2022-12-10 ENCOUNTER — Encounter: Payer: Self-pay | Admitting: Internal Medicine

## 2022-12-10 VITALS — Ht 63.0 in

## 2022-12-10 DIAGNOSIS — U071 COVID-19: Secondary | ICD-10-CM

## 2022-12-10 MED ORDER — NIRMATRELVIR/RITONAVIR (PAXLOVID)TABLET: 3.0000 | ORAL_TABLET | Freq: Two times a day (BID) | ORAL | 0 refills | Status: AC

## 2022-12-10 NOTE — Progress Notes (Unsigned)
Subjective:    Patient ID: Tiffany Gallagher, female    DOB: 09-Jul-1965, 57 y.o.   MRN: [id number]  DOS:  12/10/2022 Type of visit - description: Virtual Visit via Video Note  I connected with the above patient  by a video enabled telemedicine application and verified that I am speaking with the correct person using two identifiers.   Location of patient: home  Location of provider: office  Persons participating in the virtual visit: patient, provider   I discussed the limitations of evaluation and management by telemedicine and the availability of in person appointments. The patient expressed understanding and agreed to proceed.  Acute Symptoms started 2 days ago, tested positive for COVID yesterday. Developed cough, clear sputum, chest pain mostly with cough. She is hurting all over. No recent foreign travels, no recent travels outside [place]. She had a temperature of 101.6 yesterday, 100.5 today. Some chills. No rash. Asthma is not exacerbated, minimal sporadic short of breath for the last 48 hours.   Review of Systems See above   Past Medical History:  Diagnosis Date   Allergy    Diverticulitis    Fibroid    Hyperlipidemia    Hypertension    Migraines    Obesity, morbid, BMI 40.0-49.9 (HCC)    Sleep apnea    cpap    Past Surgical History:  Procedure Laterality Date   bone spur     removed   CESAREAN SECTION  1995   VAGINAL HYSTERECTOMY  1998   partial    Current Outpatient Medications  Medication Instructions   acyclovir ointment (ZOVIRAX) 5 % Apply three times per day as needed   albuterol (VENTOLIN HFA) 108 (90 Base) MCG/ACT inhaler 2 puffs, Inhalation, Every 6 hours PRN   benazepril (LOTENSIN) 20 mg, Oral, Daily   Blood Glucose Monitoring Suppl DEVI 1 each, Does not apply, 3 times daily, May substitute to any manufacturer covered by patient's insurance.   CALCIUM PO 1 tablet, Oral, Daily   Glucose Blood (BLOOD GLUCOSE TEST STRIPS) STRP 1  each, In Vitro, 5 times daily, May substitute to any manufacturer covered by patient's insurance.   hydrochlorothiazide (HYDRODIURIL) 25 mg, Oral, Daily   insulin aspart (NOVOLOG FLEXPEN) 100 UNIT/ML FlexPen Per sliding scale ---  no more than 40u/ day   Insulin Pen Needle 32G X 6 MM MISC As directed   Lantus SoloStar 15 Units, Subcutaneous, Daily, Use sliding scale provided to Pt   metFORMIN (GLUCOPHAGE) 1,000 mg, Oral, 2 times daily with meals   Multiple Vitamin (MULTIVITAMIN WITH MINERALS) TABS 1 tablet, Daily   Omega-3 Fatty Acids (FISH OIL PO) Oral   omeprazole (PRILOSEC) 20 mg, Oral, Daily   Orlistat (ALLI PO) 1 capsule, Oral, Daily   Pitavastatin Calcium 2 mg, Oral, Daily   thiamine (VITAMIN B-1) 100 mg, Oral, Daily   vitamin C 100 mg, Oral, Daily   VITAMIN D PO 4,000 Units, Oral, Daily       Objective:   Physical Exam Ht 5\' 3"  (1.6 m)   BMI 51.97 kg/m  This is a virtual video visit, alert oriented x 3, in no emotional physical distress, cough noted.  BP at the time of the visit was 147/75.    Assessment   57 year old female, PMH includes DM, obesity, hyperlipidemia, smoker, OSA, HTN, normal renal function, presents with   COVID: Symptoms started 2 days ago, tested positive yesterday. Multiple risk factors, recommend antivirals, will send Paxlovid.  Possible rebound discussed with her. I  sent the following instructions. Start taking Paxlovid, the antiviral as soon as possible Rest Drink plenty of fluids Use Tylenol as needed for aches and fever. Okay to continue using your routine medicines. Ambulatory blood sugar should be between 101 80. You will be contagious for the next 5 days. Recommend to get the COVID-vaccine booster in few weeks. Call if not gradually better in the next few days. Go to the ER if severe symptoms such as severe chest pain, difficulty breathing. Please call the office if you have question, concerns    I discussed the assessment and treatment  plan with the patient. The patient was provided an opportunity to ask questions and all were answered. The patient agreed with the plan and demonstrated an understanding of the instructions.   The patient was advised to call back or seek an in-person evaluation if the symptoms worsen or if the condition fails to improve as anticipated.

## 2022-12-17 ENCOUNTER — Other Ambulatory Visit: Payer: Self-pay | Admitting: Family Medicine

## 2022-12-17 DIAGNOSIS — Z1231 Encounter for screening mammogram for malignant neoplasm of breast: Secondary | ICD-10-CM

## 2023-01-06 DIAGNOSIS — G4733 Obstructive sleep apnea (adult) (pediatric): Secondary | ICD-10-CM | POA: Diagnosis not present

## 2023-01-09 ENCOUNTER — Ambulatory Visit
Admission: RE | Admit: 2023-01-09 | Discharge: 2023-01-09 | Disposition: A | Discharge status: Home / Self Care | Payer: BC Managed Care – PPO | Source: Ambulatory Visit | Attending: Family Medicine | Admitting: Family Medicine

## 2023-01-09 DIAGNOSIS — Z1231 Encounter for screening mammogram for malignant neoplasm of breast: Secondary | ICD-10-CM | POA: Diagnosis not present

## 2023-01-12 ENCOUNTER — Encounter: Payer: BC Managed Care – PPO | Attending: Family Medicine | Admitting: Dietician

## 2023-01-12 ENCOUNTER — Encounter: Payer: Self-pay | Admitting: Dietician

## 2023-01-12 VITALS — Wt 272.0 lb

## 2023-01-12 DIAGNOSIS — Z7984 Long term (current) use of oral hypoglycemic drugs: Secondary | ICD-10-CM | POA: Diagnosis not present

## 2023-01-12 DIAGNOSIS — E1165 Type 2 diabetes mellitus with hyperglycemia: Secondary | ICD-10-CM | POA: Insufficient documentation

## 2023-01-12 DIAGNOSIS — Z713 Dietary counseling and surveillance: Secondary | ICD-10-CM | POA: Insufficient documentation

## 2023-01-12 DIAGNOSIS — Z794 Long term (current) use of insulin: Secondary | ICD-10-CM | POA: Insufficient documentation

## 2023-01-12 NOTE — Patient Instructions (Signed)
Great job on changes made! Continue to take your medication as prescribed Continue to stay active. Continue mindful eating

## 2023-01-12 NOTE — Progress Notes (Signed)
Medical Nutrition Therapy  Appointment Start time:  [phone]  Appointment End time:  1530   Primary concerns today: learning more about newly diagnosis diabetes,   Referral diagnosis: Type 2 Diabetes Preferred learning style: no preference  Learning readiness: change in progress   NUTRITION ASSESSMENT    Clinical Medical Hx: Type 2 Diabetes (10/20/2022), HTN, HLD, OSA (on c-pap), diverticulitis Medications: Lantus 12 units q am, Novolog 5 units before each meal, metformin Labs: 10.5% 10/20/2022 increased from 5.6% 04/25/2021 Notable Signs/Symptoms:   Fasting blood glucose 80-100 on medications  Post meal:  80-100 at HS  Anthropometrics: 64" 274 lbs 01/12/2023 307 lbs 10/19/2022  She has lost weight by eating healthier and moving more. Lost 65 lbs when she was home during covid but regained this. Has taken Orlistat for weight loss in the past.  Lifestyle & Dietary Hx Patient does her own cooking and shopping.   She eats a lot of salad and has been choosing more vegetables and stopped the ice cream and other sweets mostly since diabetes. Used to have an intolerance to eggs but tolerates fine now with no symptoms. Lactose intolerant. She works for [employer].  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   Supplements: calcium, MVI, Mg, Potassium, Vitamin D Sleep: 6 hours on c-pap Stress / self-care: normal Current average weekly physical activity: walks the dog and walks at work and aims for 30 minutes of exercise daily.  She also has a stationary bike.  24-Hr Dietary Recall First Meal: eggs, grits, steak OR salad and salmon OR spinach and cheese OR boiled eggs and canadian bacon Snack: none Second Meal 12:30: leftovers from breakfast Snack: plain yogurt with berries OR almonds, small portion fruit Third Meal 6:30: salad with protein OR chicken and vegetables Snack: occasional similar to the afternoon Beverages: water, water with  lemon, black coffee with sugar free creamer   NUTRITION DIAGNOSIS  NB-1.1 Food and nutrition-related knowledge deficit As related to balanced of carbohydrates, protein, fat.  As evidenced by pt report and history.   NUTRITION INTERVENTION  Nutrition education (E-1) on the following topics:  Counseling and diabetes education initiated.  Discussed My Royetta Crochet, the 3 main macronutrients and implications on blood glucose Discussed label reading Discussed benefits of increased activity and tips for any barriers Discussed basic physiology of Diabetes Instructed on blood glucose target, BG ranges pre and post meals, and A1C goals  Handouts Provided Include  How to Thrive:  A Guide for Your Journey with Diabetes by the ADA Meal Plan Card Snack list Label reading Diabetes Resources  Learning Style & Readiness for Change Teaching method utilized: Visual & Auditory  Demonstrated degree of understanding via: Teach Back  Barriers to learning/adherence to lifestyle change: none  Goals Established by Pt Great job on changes made! Continue to take your medication as prescribed Continue to stay active. Continue mindful eating   MONITORING & EVALUATION Dietary intake, weekly physical activity, and label reading prn.  Next Steps  Patient is to use my chart for any questions or appointment needs.Marland Kitchen

## 2023-01-20 ENCOUNTER — Ambulatory Visit (INDEPENDENT_AMBULATORY_CARE_PROVIDER_SITE_OTHER): Payer: BC Managed Care – PPO | Admitting: Family Medicine

## 2023-01-20 ENCOUNTER — Encounter: Payer: Self-pay | Admitting: Family Medicine

## 2023-01-20 VITALS — BP 130/70 | HR 81 | Temp 98.7°F | Resp 18 | Ht 63.0 in | Wt 274.0 lb

## 2023-01-20 DIAGNOSIS — E785 Hyperlipidemia, unspecified: Secondary | ICD-10-CM

## 2023-01-20 DIAGNOSIS — I10 Essential (primary) hypertension: Secondary | ICD-10-CM

## 2023-01-20 DIAGNOSIS — E1165 Type 2 diabetes mellitus with hyperglycemia: Secondary | ICD-10-CM

## 2023-01-20 DIAGNOSIS — D229 Melanocytic nevi, unspecified: Secondary | ICD-10-CM

## 2023-01-20 LAB — CBC WITH DIFFERENTIAL/PLATELET
Basophils Absolute: 0 10*3/uL (ref 0.0–0.1)
Basophils Relative: 0.6 % (ref 0.0–3.0)
Eosinophils Absolute: 0.3 10*3/uL (ref 0.0–0.7)
Eosinophils Relative: 5 % (ref 0.0–5.0)
HCT: 35.9 % — ABNORMAL LOW (ref 36.0–46.0)
Hemoglobin: 11.4 g/dL — ABNORMAL LOW (ref 12.0–15.0)
Lymphocytes Relative: 35 % (ref 12.0–46.0)
Lymphs Abs: 2.3 10*3/uL (ref 0.7–4.0)
MCHC: 31.8 g/dL (ref 30.0–36.0)
MCV: 94.4 fL (ref 78.0–100.0)
Monocytes Absolute: 0.4 10*3/uL (ref 0.1–1.0)
Monocytes Relative: 6.6 % (ref 3.0–12.0)
Neutro Abs: 3.5 10*3/uL (ref 1.4–7.7)
Neutrophils Relative %: 52.8 % (ref 43.0–77.0)
Platelets: 319 10*3/uL (ref 150.0–400.0)
RBC: 3.81 Mil/uL — ABNORMAL LOW (ref 3.87–5.11)
RDW: 15.3 % (ref 11.5–15.5)
WBC: 6.6 10*3/uL (ref 4.0–10.5)

## 2023-01-20 LAB — LIPID PANEL
Cholesterol: 148 mg/dL (ref 0–200)
HDL: 40.9 mg/dL (ref >39.00)
LDL Cholesterol: 88 mg/dL (ref 0–99)
NonHDL: 106.88
Total CHOL/HDL Ratio: 4
Triglycerides: 92 mg/dL (ref 0.0–149.0)
VLDL: 18.4 mg/dL (ref 0.0–40.0)

## 2023-01-20 LAB — COMPREHENSIVE METABOLIC PANEL
ALT: 10 U/L (ref 0–35)
AST: 12 U/L (ref 0–37)
Albumin: 4 g/dL (ref 3.5–5.2)
Alkaline Phosphatase: 52 U/L (ref 39–117)
BUN: 10 mg/dL (ref 6–23)
CO2: 29 meq/L (ref 19–32)
Calcium: 9.4 mg/dL (ref 8.4–10.5)
Chloride: 104 meq/L (ref 96–112)
Creatinine, Ser: 0.61 mg/dL (ref 0.40–1.20)
GFR: 99.47 mL/min (ref >60.00)
Glucose, Bld: 85 mg/dL (ref 70–99)
Potassium: 3.9 meq/L (ref 3.5–5.1)
Sodium: 141 meq/L (ref 135–145)
Total Bilirubin: 0.6 mg/dL (ref 0.2–1.2)
Total Protein: 6.9 g/dL (ref 6.0–8.3)

## 2023-01-20 LAB — MICROALBUMIN / CREATININE URINE RATIO
Creatinine,U: 24.8 mg/dL
Microalb Creat Ratio: 3.3 mg/g (ref 0.0–30.0)
Microalb, Ur: 0.8 mg/dL (ref 0.0–1.9)

## 2023-01-20 LAB — HEMOGLOBIN A1C: Hgb A1c MFr Bld: 5.5 % (ref 4.6–6.5)

## 2023-01-20 MED ORDER — PITAVASTATIN CALCIUM 2 MG PO TABS
2.0000 mg | ORAL_TABLET | Freq: Every day | ORAL | 3 refills | Status: AC
Start: 2023-01-20 — End: ?

## 2023-01-20 MED ORDER — BENAZEPRIL HCL 20 MG PO TABS: 20.0000 mg | ORAL_TABLET | Freq: Every day | ORAL | 3 refills | Status: DC

## 2023-01-20 MED ORDER — HYDROCHLOROTHIAZIDE 25 MG PO TABS: 25.0000 mg | ORAL_TABLET | Freq: Every day | ORAL | 3 refills | Status: DC

## 2023-01-20 NOTE — Progress Notes (Signed)
Established Patient Office Visit  Subjective   Patient ID: Tiffany Gallagher, female    DOB: Apr 12, 1966  Age: 57 y.o. MRN: [id number]  Chief Complaint  Patient presents with   Diabetes   Hyperlipidemia   Hypertension   Follow-up    HPI Discussed the use of AI scribe software for clinical note transcription with the patient, who gave verbal consent to proceed.  History of Present Illness   The patient, with a history of diabetes, presents with tingling in the right hand, primarily affecting the first three fingers. The sensation occurs throughout the day and is not associated with waking. The patient spends her day typing and doing arts and crafts. She has previously experienced symptoms suggestive of carpal tunnel syndrome, localized to the wrist, and has a splint which she uses when these symptoms occur. She was unsure if the current finger tingling could also be due to carpal tunnel syndrome.  The patient's diabetes control has been good, and she has been losing weight steadily over the past seven months, from 308 lbs to 274 lbs. She attributes this to dietary changes, including a significant increase in vegetable intake. She has been seeing a nutritionist who has confirmed that her weight loss rate is appropriate.  The patient also reports a dry spot on her left leg, which she initially thought was eczema. However, the spot has been slowly expanding and does not respond to her usual eczema cream. She has been using a moisturizer on the area instead. She also noticed a new spot on her right foot, which she is concerned about.  The patient has been up to date with her eye checks, and her most recent visit revealed a slight weakness in the right eye. She was given a prescription for reading glasses, but was advised that she could continue to use her current readers if they were working well.      Patient Active Problem List   Diagnosis Date Noted   Type 2 diabetes mellitus with  hyperglycemia, without long-term current use of insulin (HCC) 10/20/2022   History of total vaginal hysterectomy (TVH) 07/04/2020   Healthcare maintenance 04/26/2020   It band syndrome, right 04/24/2020   Hyperlipidemia 09/19/2019   Mild intermittent asthma without complication 09/19/2019   Hair loss 09/19/2019   Dyspepsia 09/19/2019   Right hip pain 09/19/2019   Leg skin lesion, right 06/09/2017   OSA (obstructive sleep apnea) 06/05/2016   Preventative health care 02/09/2015   Acute bacterial bronchitis 11/15/2014   Severe obesity (BMI >= 40) (HCC) 05/10/2014   Physical exam 01/20/2014   HSV-1 infection 06/20/2013   Migraines    Smoker    Sinusitis 04/11/2011   Leg cramps 09/18/2010   Edema 09/18/2010   VAGINAL DISCHARGE 09/20/2009   KNEE PAIN, BILATERAL 09/20/2009   FLATULENCE ERUCTATION AND GAS PAIN 08/03/2009   HEMATURIA, HX OF 07/04/2009   Essential hypertension 02/07/2008   GERD 11/30/2006   Past Medical History:  Diagnosis Date   Allergy    Diabetes mellitus without complication (HCC)    Diverticulitis    Fibroid    Hyperlipidemia    Hypertension    Migraines    Obesity, morbid, BMI 40.0-49.9 (HCC)    Sleep apnea    cpap   Past Surgical History:  Procedure Laterality Date   bone spur     removed   CESAREAN SECTION  1995   VAGINAL HYSTERECTOMY  1998   partial   Social History   Tobacco Use  Smoking status: Every Day    Current packs/day: 0.50    Average packs/day: 0.5 packs/day for 32.0 years (16.0 ttl pk-yrs)    Types: Cigarettes   Smokeless tobacco: Never   Tobacco comments:    cutting back  Vaping Use   Vaping status: Never Used  Substance Use Topics   Alcohol use: Yes    Comment: Occas wine   Drug use: Yes    Types: Marijuana    Comment: occ   Social History   Socioeconomic History   Marital status: Divorced    Spouse name: Not on file   Number of children: 1   Years of education: 17   Highest education level: Bachelor's degree  (e.g., BA, AB, BS)  Occupational History   Occupation: customer service   Tobacco Use   Smoking status: Every Day    Current packs/day: 0.50    Average packs/day: 0.5 packs/day for 32.0 years (16.0 ttl pk-yrs)    Types: Cigarettes   Smokeless tobacco: Never   Tobacco comments:    cutting back  Vaping Use   Vaping status: Never Used  Substance and Sexual Activity   Alcohol use: Yes    Comment: Occas wine   Drug use: Yes    Types: Marijuana    Comment: occ   Sexual activity: Not Currently    Partners: Male    Birth control/protection: Surgical, Condom  Other Topics Concern   Not on file  Social History Narrative   Exercise-- just starting to walk 4 -7 days a week , yoga    Social Determinants of Health   Financial Resource Strain: Low Risk  (01/16/2023)   Overall Financial Resource Strain (CARDIA)    Difficulty of Paying Living Expenses: Not hard at all  Food Insecurity: No Food Insecurity (01/16/2023)   Hunger Vital Sign    Worried About Running Out of Food in the Last Year: Never true    Ran Out of Food in the Last Year: Never true  Transportation Needs: No Transportation Needs (01/16/2023)   PRAPARE - Administrator, Civil Service (Medical): No    Lack of Transportation (Non-Medical): No  Physical Activity: Insufficiently Active (01/16/2023)   Exercise Vital Sign    Days of Exercise per Week: 4 days    Minutes of Exercise per Session: 30 min  Stress: No Stress Concern Present (01/16/2023)   [employer] of Occupational Health - Occupational Stress Questionnaire    Feeling of Stress : Not at all  Social Connections: Moderately Integrated (01/16/2023)   Social Connection and Isolation Panel [NHANES]    Frequency of Communication with Friends and Family: More than three times a week    Frequency of Social Gatherings with Friends and Family: Once a week    Attends Religious Services: More than 4 times per year    Active Member of [employer] or Organizations: Yes     Attends Engineer, structural: More than 4 times per year    Marital Status: Divorced  Catering manager Violence: Not on file   Family Status  Relation Name Status   Mother  Deceased   Father  Deceased   Sister theresa Alive   Sister  Alive   Brother victor Alive   Brother willie Alive  No partnership data on file   Family History  Problem Relation Age of Onset   Coronary artery disease Mother    Hypertension Mother    Kidney disease Mother 75  dialysis   Aneurysm Mother        brain   Stroke Mother    Heart disease Mother        MI   Cancer Father 41       brain cancer   Diabetes Sister    Hyperlipidemia Sister    Hypertension Sister    Hypertension Sister    Arthritis Brother    Hypertension Brother    Diabetes Brother    Hypertension Brother    Heart attack Brother    Allergies  Allergen Reactions   Egg-Derived Products    Lactose Intolerance (Gi)    Valtrex [Valacyclovir]     itching   Lipitor [Atorvastatin Calcium]     Nerves, muscle spasms, light stool      Review of Systems  Constitutional:  Negative for fever and malaise/fatigue.  HENT:  Negative for congestion.   Eyes:  Negative for blurred vision.  Respiratory:  Negative for shortness of breath.   Cardiovascular:  Negative for chest pain, palpitations and leg swelling.  Gastrointestinal:  Negative for abdominal pain, blood in stool and nausea.  Genitourinary:  Negative for dysuria and frequency.  Musculoskeletal:  Negative for falls.  Skin:  Negative for rash.  Neurological:  Negative for dizziness, loss of consciousness and headaches.  Endo/Heme/Allergies:  Negative for environmental allergies.  Psychiatric/Behavioral:  Negative for depression. The patient is not nervous/anxious.       Objective:     BP 130/70 (BP Location: Left Arm, Patient Position: Sitting, Cuff Size: Large)   Pulse 81   Temp 98.7 F (37.1 C) (Oral)   Resp 18   Ht 5\' 3"  (1.6 m)   Wt 274 lb (124.3  kg)   SpO2 98%   BMI 48.54 kg/m  BP Readings from Last 3 Encounters:  01/20/23 130/70  10/20/22 104/80  10/19/22 119/67   Wt Readings from Last 3 Encounters:  01/20/23 274 lb (124.3 kg)  01/12/23 272 lb (123.4 kg)  10/20/22 293 lb 6.4 oz (133.1 kg)   SpO2 Readings from Last 3 Encounters:  01/20/23 98%  10/20/22 95%  10/19/22 96%      Physical Exam Vitals and nursing note reviewed.  Constitutional:      General: She is not in acute distress.    Appearance: Normal appearance. She is well-developed.  HENT:     Head: Normocephalic and atraumatic.  Eyes:     General: No scleral icterus.       Right eye: No discharge.        Left eye: No discharge.  Cardiovascular:     Rate and Rhythm: Normal rate and regular rhythm.     Heart sounds: No murmur heard. Pulmonary:     Effort: Pulmonary effort is normal. No respiratory distress.     Breath sounds: Normal breath sounds.  Musculoskeletal:        General: Normal range of motion.     Cervical back: Normal range of motion and neck supple.     Right lower leg: No edema.     Left lower leg: Edema present.  Skin:    General: Skin is warm and dry.     Comments: Black mole L lower leg--- new  Non tender  L ankle---  dry scaly skin   Neurological:     General: No focal deficit present.     Mental Status: She is alert and oriented to person, place, and time.  Psychiatric:  Mood and Affect: Mood normal.        Behavior: Behavior normal.        Thought Content: Thought content normal.        Judgment: Judgment normal.      No results found for any visits on 01/20/23.  Last CBC Lab Results  Component Value Date   WBC 6.8 10/20/2022   HGB 14.1 10/20/2022   HCT 43.0 10/20/2022   MCV 93.4 10/20/2022   MCH 31.1 10/19/2022   RDW 13.9 10/20/2022   PLT 267.0 10/20/2022   Last metabolic panel Lab Results  Component Value Date   GLUCOSE 469 (H) 10/20/2022   NA 133 (L) 10/20/2022   K 4.0 10/20/2022   CL 98  10/20/2022   CO2 25 10/20/2022   BUN 19 10/20/2022   CREATININE 0.88 10/20/2022   GFR 73.24 10/20/2022   CALCIUM 10.1 10/20/2022   PHOS 2.9 09/18/2010   PROT 7.4 10/20/2022   ALBUMIN 4.1 10/20/2022   BILITOT 0.6 10/20/2022   ALKPHOS 84 10/20/2022   AST 16 10/20/2022   ALT 19 10/20/2022   ANIONGAP 15 10/19/2022   Last lipids Lab Results  Component Value Date   CHOL 242 (H) 10/20/2022   HDL 37.60 (L) 10/20/2022   LDLCALC 143 (H) 08/05/2022   LDLDIRECT 172.0 10/20/2022   TRIG 241.0 (H) 10/20/2022   CHOLHDL 6 10/20/2022   Last hemoglobin A1c Lab Results  Component Value Date   HGBA1C 10.5 (H) 10/20/2022   Last thyroid functions Lab Results  Component Value Date   TSH 2.70 04/28/2022   T4TOTAL 9.2 09/16/2019   Last vitamin D Lab Results  Component Value Date   VD25OH 25.30 (L) 04/25/2021   Last vitamin B12 and Folate Lab Results  Component Value Date   VITAMINB12 174 (L) 09/16/2019   FOLATE 6.0 09/16/2019      The 10-year ASCVD risk score (Arnett DK, et al., 2019) is: 38%    Assessment & Plan:   Problem List Items Addressed This Visit       Unprioritized   Hyperlipidemia   Relevant Medications   benazepril (LOTENSIN) 20 MG tablet   hydrochlorothiazide (HYDRODIURIL) 25 MG tablet   Pitavastatin Calcium 2 MG TABS   Other Relevant Orders   CBC with Differential/Platelet   Comprehensive metabolic panel   Lipid panel   Essential hypertension   Relevant Medications   benazepril (LOTENSIN) 20 MG tablet   hydrochlorothiazide (HYDRODIURIL) 25 MG tablet   Pitavastatin Calcium 2 MG TABS   Other Relevant Orders   CBC with Differential/Platelet   Comprehensive metabolic panel   Lipid panel   Type 2 diabetes mellitus with hyperglycemia, without long-term current use of insulin (HCC) - Primary   Relevant Medications   benazepril (LOTENSIN) 20 MG tablet   Pitavastatin Calcium 2 MG TABS   Other Relevant Orders   Hemoglobin A1c   Microalbumin / creatinine  urine ratio   Other Visit Diagnoses     Suspicious nevus       Relevant Orders   Ambulatory referral to Dermatology     Assessment and Plan    Carpal Tunnel Syndrome Tingling in the right hand, predominantly in the first three fingers. No current numbness. Patient has a history of carpal tunnel syndrome and has splints available. -Advise to wear splints, especially during activities that exacerbate symptoms. -Consider referral to a specialist if symptoms persist or worsen.  Type 2 Diabetes Mellitus Patient reports good control of blood sugars. No current symptoms  of hyperglycemia or hypoglycemia. -Continue current regimen of Novolog, Lantus, and Metformin. -Continue regular monitoring of blood glucose levels.  Weight Loss Patient has lost approximately 34 pounds over the past 7 months, which is a healthy rate of weight loss. Patient attributes weight loss to dietary changes and improved diabetes control. -Encourage continued healthy eating habits and regular physical activity. -Monitor weight loss and consider further investigation if weight loss continues at a rapid pace or if patient develops additional symptoms.  Dermatological Concerns Dry spots on left leg, possibly related to diabetes or eczema. New lesion on left foot of unknown origin. -Advise to continue using moisturizer for dry spots. -Recommend dermatology consultation for new lesion on left foot.  General Health Maintenance -Continue regular eye exams with ophthalmologist. -Continue regular screenings as recommended (Pap smear, mammogram, colonoscopy). -Consider shingles vaccine; patient currently declines. -Refill Pitavastatin and blood pressure medication as needed.        No follow-ups on file.    Donato Schultz, DO

## 2023-01-20 NOTE — Patient Instructions (Signed)
Mole A mole is a colored (pigmented) growth on the skin. Moles are very common. They are usually harmless, but some moles can become cancerous over time. What are the causes? Moles are caused when pigmented skin cells grow together in clusters instead of spreading out in the skin as they normally do. The reason why the skin cells grow together in clusters is not known. What increases the risk? You are more likely to develop a mole if: You have family members who have moles. You are fair skinned. You have red or blond hair. You are often outdoors and exposed to the sun. You received phototherapy when you were a newborn baby. You are female. What are the signs or symptoms? A mole may occur anywhere on your skin. A mole may be: [place] or another color. Although moles are most often brown, they can also be tan, black, red, pink, blue, skin-toned, or colorless. Flat or raised. Smooth or wrinkled. Round in shape. How is this diagnosed? A mole is diagnosed with a skin exam. If your health care provider thinks a mole may be cancerous, all or part of the mole will be removed for testing (biopsy). How is this treated? Most moles are noncancerous (benign) and do not require treatment. If a mole is found to be cancerous, it will be removed. You may also choose to have a mole removed if it is causing pain or if you do not like the way it looks. Follow these instructions at home: General instructions  Every month, look for new moles and check your existing moles for changes. This is important because a change in a mole can mean that the mole has become cancerous. ABCDE changes in a mole indicate that you should be evaluated by your health care provider. ABCDE stands for: Asymmetry. This means the mole has an irregular shape. It is not round or oval. Border. This means the mole has an irregular or bumpy border. Color. This means the mole has multiple colors in it, including brown, black, blue, red, or  tan. Note that it is normal for moles to get darker when a woman is pregnant or takes birth control pills. Diameter. This means the mole is more than 0.2 inches (6 mm) across. Evolving. This refers to any unusual changes or symptoms in the mole, such as pain, itching, stinging, sensitivity, or bleeding. If you have a large number of moles, see a skin doctor (dermatologist) at least one time every year for a full-body skin check. Lifestyle  When you are outdoors, wear sunscreen with SPF 30 (sun protection factor 30) or higher. Use an adequate amount of sunscreen to cover exposed areas of skin. Put it on 30 minutes before you go out. Reapply it every 2 hours or anytime you come out of the water. When you are out in the sun, wear a broad-brimmed hat and clothing that covers your arms and legs. Wear wraparound sunglasses. Contact a health care provider if: The size, shape, borders, or color of your mole changes. Your mole, or the skin near the mole, becomes painful, sore, red, or swollen. Your mole: Develops more than one color. Itches or bleeds. Becomes scaly, sheds skin, or oozes fluid. Becomes flat or develops raised areas. Becomes hard or soft. You develop a new mole. Summary A mole is a colored (pigmented) growth on the skin. Moles are very common. They are usually harmless, but some moles can become cancerous over time. Every month, look for new moles and check your  existing moles for changes. This is important because a change in a mole can mean that the mole has become cancerous. If you have a large number of moles, see a skin doctor (dermatologist) at least one time every year for a full-body skin check. When you are outdoors, wear sunscreen with SPF 30 (sun protection factor 30) or higher. Reapply it every 2 hours or anytime you come out of the water. Contact a health care provider if you notice changes in a mole or if you develop a new mole. This information is not intended to replace  advice given to you by your health care provider. Make sure you discuss any questions you have with your health care provider. Document Revised: 12/27/2020 Document Reviewed: 12/27/2020 Elsevier Patient Education  2024 [employer].

## 2023-01-26 ENCOUNTER — Other Ambulatory Visit: Payer: Self-pay | Admitting: Family Medicine

## 2023-01-26 ENCOUNTER — Encounter: Payer: Self-pay | Admitting: Family Medicine

## 2023-01-26 DIAGNOSIS — D509 Iron deficiency anemia, unspecified: Secondary | ICD-10-CM

## 2023-02-05 DIAGNOSIS — G4733 Obstructive sleep apnea (adult) (pediatric): Secondary | ICD-10-CM | POA: Diagnosis not present

## 2023-02-09 DIAGNOSIS — S40262A Insect bite (nonvenomous) of left shoulder, initial encounter: Secondary | ICD-10-CM | POA: Diagnosis not present

## 2023-02-09 DIAGNOSIS — L309 Dermatitis, unspecified: Secondary | ICD-10-CM | POA: Diagnosis not present

## 2023-02-09 DIAGNOSIS — D2372 Other benign neoplasm of skin of left lower limb, including hip: Secondary | ICD-10-CM | POA: Diagnosis not present

## 2023-02-16 ENCOUNTER — Other Ambulatory Visit: Payer: Self-pay

## 2023-02-16 ENCOUNTER — Other Ambulatory Visit: Payer: Self-pay | Admitting: Family Medicine

## 2023-02-16 DIAGNOSIS — E1165 Type 2 diabetes mellitus with hyperglycemia: Secondary | ICD-10-CM

## 2023-02-16 DIAGNOSIS — E785 Hyperlipidemia, unspecified: Secondary | ICD-10-CM

## 2023-02-23 ENCOUNTER — Other Ambulatory Visit (INDEPENDENT_AMBULATORY_CARE_PROVIDER_SITE_OTHER): Payer: BC Managed Care – PPO

## 2023-02-23 DIAGNOSIS — E1165 Type 2 diabetes mellitus with hyperglycemia: Secondary | ICD-10-CM

## 2023-02-23 LAB — LIPID PANEL
Cholesterol: 161 mg/dL (ref 0–200)
HDL: 43.9 mg/dL
LDL Cholesterol: 99 mg/dL (ref 0–99)
NonHDL: 117.21
Total CHOL/HDL Ratio: 4
Triglycerides: 90 mg/dL (ref 0.0–149.0)
VLDL: 18 mg/dL (ref 0.0–40.0)

## 2023-02-23 LAB — COMPREHENSIVE METABOLIC PANEL WITH GFR
ALT: 13 U/L (ref 0–35)
AST: 14 U/L (ref 0–37)
Albumin: 4.2 g/dL (ref 3.5–5.2)
Alkaline Phosphatase: 60 U/L (ref 39–117)
BUN: 16 mg/dL (ref 6–23)
CO2: 28 meq/L (ref 19–32)
Calcium: 9.8 mg/dL (ref 8.4–10.5)
Chloride: 107 meq/L (ref 96–112)
Creatinine, Ser: 0.64 mg/dL (ref 0.40–1.20)
GFR: 98.26 mL/min
Glucose, Bld: 97 mg/dL (ref 70–99)
Potassium: 3.5 meq/L (ref 3.5–5.1)
Sodium: 143 meq/L (ref 135–145)
Total Bilirubin: 0.4 mg/dL (ref 0.2–1.2)
Total Protein: 7.8 g/dL (ref 6.0–8.3)

## 2023-02-23 LAB — HEMOGLOBIN A1C: Hgb A1c MFr Bld: 5.3 % (ref 4.6–6.5)

## 2023-02-23 LAB — MICROALBUMIN / CREATININE URINE RATIO
Creatinine,U: 128.7 mg/dL
Microalb Creat Ratio: 18.9 mg/g (ref 0.0–30.0)
Microalb, Ur: 24.3 mg/dL — ABNORMAL HIGH (ref 0.0–1.9)

## 2023-02-26 ENCOUNTER — Other Ambulatory Visit (INDEPENDENT_AMBULATORY_CARE_PROVIDER_SITE_OTHER): Payer: BC Managed Care – PPO

## 2023-02-26 ENCOUNTER — Encounter: Payer: Self-pay | Admitting: Family Medicine

## 2023-02-26 DIAGNOSIS — D509 Iron deficiency anemia, unspecified: Secondary | ICD-10-CM

## 2023-02-26 LAB — CBC WITH DIFFERENTIAL/PLATELET
Basophils Absolute: 0 10*3/uL (ref 0.0–0.1)
Basophils Relative: 0.4 % (ref 0.0–3.0)
Eosinophils Absolute: 0.2 10*3/uL (ref 0.0–0.7)
Eosinophils Relative: 4 % (ref 0.0–5.0)
HCT: 34.6 % — ABNORMAL LOW (ref 36.0–46.0)
Hemoglobin: 11.3 g/dL — ABNORMAL LOW (ref 12.0–15.0)
Lymphocytes Relative: 43.7 % (ref 12.0–46.0)
Lymphs Abs: 2.7 10*3/uL (ref 0.7–4.0)
MCHC: 32.7 g/dL (ref 30.0–36.0)
MCV: 94 fL (ref 78.0–100.0)
Monocytes Absolute: 0.4 10*3/uL (ref 0.1–1.0)
Monocytes Relative: 7 % (ref 3.0–12.0)
Neutro Abs: 2.7 10*3/uL (ref 1.4–7.7)
Neutrophils Relative %: 44.9 % (ref 43.0–77.0)
Platelets: 307 10*3/uL (ref 150.0–400.0)
RBC: 3.69 Mil/uL — ABNORMAL LOW (ref 3.87–5.11)
RDW: 15.4 % (ref 11.5–15.5)
WBC: 6.1 10*3/uL (ref 4.0–10.5)

## 2023-02-27 ENCOUNTER — Encounter: Payer: Self-pay | Admitting: "Endocrinology

## 2023-02-27 ENCOUNTER — Ambulatory Visit (INDEPENDENT_AMBULATORY_CARE_PROVIDER_SITE_OTHER): Payer: BC Managed Care – PPO | Admitting: "Endocrinology

## 2023-02-27 ENCOUNTER — Other Ambulatory Visit: Payer: Self-pay | Admitting: Family Medicine

## 2023-02-27 VITALS — BP 130/90 | HR 86 | Ht 63.0 in | Wt 267.0 lb

## 2023-02-27 DIAGNOSIS — Z7984 Long term (current) use of oral hypoglycemic drugs: Secondary | ICD-10-CM | POA: Diagnosis not present

## 2023-02-27 DIAGNOSIS — E1165 Type 2 diabetes mellitus with hyperglycemia: Secondary | ICD-10-CM

## 2023-02-27 DIAGNOSIS — Z794 Long term (current) use of insulin: Secondary | ICD-10-CM

## 2023-02-27 DIAGNOSIS — E1140 Type 2 diabetes mellitus with diabetic neuropathy, unspecified: Secondary | ICD-10-CM

## 2023-02-27 DIAGNOSIS — E7800 Pure hypercholesterolemia, unspecified: Secondary | ICD-10-CM

## 2023-02-27 LAB — IRON,TIBC AND FERRITIN PANEL
%SAT: 19 % (ref 16–45)
Ferritin: 126 ng/mL (ref 16–232)
Iron: 63 ug/dL (ref 45–160)
TIBC: 338 ug/dL (ref 250–450)

## 2023-02-27 MED ORDER — TIRZEPATIDE 2.5 MG/0.5ML ~~LOC~~ SOAJ: 2.5000 mg | SUBCUTANEOUS | 3 refills | Status: DC

## 2023-02-27 MED ORDER — METFORMIN HCL ER 500 MG PO TB24: 1000.0000 mg | ORAL_TABLET | Freq: Two times a day (BID) | ORAL | 3 refills | Status: DC

## 2023-02-27 NOTE — Patient Instructions (Signed)

## 2023-02-27 NOTE — Progress Notes (Signed)
Outpatient Endocrinology Note Tiffany [place], MD  02/27/23   Tiffany Gallagher March 09, 1966 [id number]  Referring Provider: Zola Button, Grayling Congress, * Primary Care Provider: Zola Button, Grayling Congress, DO Reason for consultation: Subjective   Assessment & Plan  Diagnoses and all orders for this visit:  Type 2 diabetes mellitus with diabetic neuropathy, with long-term current use of insulin (HCC)  Long term (current) use of oral hypoglycemic drugs  Pure hypercholesterolemia  Other orders -     metFORMIN (GLUCOPHAGE-XR) 500 MG 24 hr tablet; Take 2 tablets (1,000 mg total) by mouth 2 (two) times daily with a meal. -     tirzepatide (MOUNJARO) 2.5 MG/0.5ML Pen; Inject 2.5 mg into the skin once a week.    Diabetes Type II complicated by neuropathy,  Lab Results  Component Value Date   GFR 98.26 02/23/2023   Hba1c goal less than 7, current Hba1c is  Lab Results  Component Value Date   HGBA1C 5.3 02/23/2023   Will recommend the following: Lantus 12 units a day Novolog 5 units tid Metformin XR 500 mg 2 pills bid (cannot tolerate regular full dose due to GI S/E) Ordered ozempic 0.25 mg/week  No known contraindications/side effects to any of above medications No history of MEN syndrome/medullary thyroid cancer/pancreatitis or pancreatic cancer in self or family  -Last LD and Tg are as follows: Lab Results  Component Value Date   LDLCALC 99 02/23/2023    Lab Results  Component Value Date   TRIG 90.0 02/23/2023   -On pitavastatin 2 mg QD -Follow low fat diet and exercise   -Blood pressure goal <140/90 - Microalbumin/creatinine goal is < 30 -Last MA/Cr is as follows: Lab Results  Component Value Date   MICROALBUR 24.3 (H) 02/23/2023   -on ACE/ARB benazepril 20 mg qd -diet changes including salt restriction -limit eating outside -counseled BP targets per standards of diabetes care -uncontrolled blood pressure can lead to retinopathy, nephropathy and  cardiovascular and atherosclerotic heart disease  Reviewed and counseled on: -A1C target -Blood sugar targets -Complications of uncontrolled diabetes  -Checking blood sugar before meals and bedtime and bring log next visit -All medications with mechanism of action and side effects -Hypoglycemia management: rule of 15's, Glucagon Emergency Kit and medical alert ID -low-carb low-fat plate-method diet -At least 20 minutes of physical activity per day -Annual dilated retinal eye exam and foot exam -compliance and follow up needs -follow up as scheduled or earlier if problem gets worse  Call if blood sugar is less than 70 or consistently above 250    Take a 15 gm snack of carbohydrate at bedtime before you go to sleep if your blood sugar is less than 100.    If you are going to fast after midnight for a test or procedure, ask your physician for instructions on how to reduce/decrease your insulin dose.    Call if blood sugar is less than 70 or consistently above 250  -Treating a low sugar by rule of 15  (15 gms of sugar every 15 min until sugar is more than 70) If you feel your sugar is low, test your sugar to be sure If your sugar is low (less than 70), then take 15 grams of a fast acting Carbohydrate (3-4 glucose tablets or glucose gel or 4 ounces of juice or regular soda) Recheck your sugar 15 min after treating low to make sure it is more than 70 If sugar is still less than 70, treat again with  15 grams of carbohydrate          Don't drive the hour of hypoglycemia  If unconscious/unable to eat or drink by mouth, use glucagon injection or nasal spray baqsimi and call 911. Can repeat again in 15 min if still unconscious.  Return in about 4 weeks (around 03/27/2023).   I have reviewed current medications, nurse's notes, allergies, vital signs, past medical and surgical history, family medical history, and social history for this encounter. Counseled patient on symptoms, examination  findings, lab findings, imaging results, treatment decisions and monitoring and prognosis. The patient understood the recommendations and agrees with the treatment plan. All questions regarding treatment plan were fully answered.  Tiffany [place], MD  02/27/23   History of Present Illness Tiffany Gallagher is a 57 y.o. year old female who presents for evaluation of Type II diabetes mellitus.  Tiffany Gallagher was first diagnosed in 2024.   Diabetes education +  Home diabetes regimen: Lantus 12 units a day Novolog 5 units tid Metformin 1000 mg bid  COMPLICATIONS -  MI/Stroke -  retinopathy +  neuropathy -  nephropathy  BLOOD SUGAR DATA 94-155 per meter Checks 3 times a day  Physical Exam  BP (!) 130/90   Pulse 86   Ht 5\' 3"  (1.6 m)   Wt 267 lb (121.1 kg)   SpO2 97%   BMI 47.30 kg/m    Constitutional: well developed, well nourished Head: normocephalic, atraumatic Eyes: sclera anicteric, no redness Neck: supple Lungs: normal respiratory effort Neurology: alert and oriented Skin: dry, no appreciable rashes Musculoskeletal: no appreciable defects Psychiatric: normal mood and affect Diabetic Foot Exam - Simple   No data filed      Current Medications Patient's Medications  New Prescriptions   METFORMIN (GLUCOPHAGE-XR) 500 MG 24 HR TABLET    Take 2 tablets (1,000 mg total) by mouth 2 (two) times daily with a meal.   TIRZEPATIDE (MOUNJARO) 2.5 MG/0.5ML PEN    Inject 2.5 mg into the skin once a week.  Previous Medications   ACYCLOVIR OINTMENT (ZOVIRAX) 5 %    Apply three times per day as needed   ALBUTEROL (VENTOLIN HFA) 108 (90 BASE) MCG/ACT INHALER    Inhale 2 puffs into the lungs every 6 (six) hours as needed for wheezing or shortness of breath.   ASCORBIC ACID (VITAMIN C) 100 MG TABLET    Take 100 mg by mouth daily.   BENAZEPRIL (LOTENSIN) 20 MG TABLET    Take 1 tablet (20 mg total) by mouth daily.   BLOOD GLUCOSE MONITORING SUPPL DEVI    1 each by Does not  apply route in the morning, at noon, and at bedtime. May substitute to any manufacturer covered by patient's insurance.   CALCIUM PO    Take 1 tablet by mouth daily.   GLUCOSE BLOOD (BLOOD GLUCOSE TEST STRIPS) STRP    1 each by In Vitro route 5 (five) times daily. May substitute to any manufacturer covered by patient's insurance.   HYDROCHLOROTHIAZIDE (HYDRODIURIL) 25 MG TABLET    Take 1 tablet (25 mg total) by mouth daily.   INSULIN ASPART (NOVOLOG FLEXPEN) 100 UNIT/ML FLEXPEN    Per sliding scale ---  no more than 40u/ day   INSULIN GLARGINE (LANTUS SOLOSTAR) 100 UNIT/ML SOLOSTAR PEN    Inject 15 Units into the skin daily. Use sliding scale provided to Pt   INSULIN PEN NEEDLE (BD PEN NEEDLE NANO 2ND GEN) 32G X 4 MM MISC  To use w/ Lantus   MULTIPLE VITAMIN (MULTIVITAMIN WITH MINERALS) TABS    Take 1 tablet by mouth daily.   OMEGA-3 FATTY ACIDS (FISH OIL PO)    Take by mouth.   OMEPRAZOLE (PRILOSEC) 20 MG CAPSULE    Take 1 capsule (20 mg total) by mouth daily.   ORLISTAT (ALLI PO)    Take 1 capsule by mouth daily.   PITAVASTATIN CALCIUM 2 MG TABS    Take 1 tablet (2 mg total) by mouth daily.   THIAMINE (VITAMIN B-1) 100 MG TABLET    Take 100 mg by mouth daily.   VITAMIN D PO    Take 4,000 Units by mouth daily.  Modified Medications   No medications on file  Discontinued Medications   METFORMIN (GLUCOPHAGE) 1000 MG TABLET    Take 1 tablet (1,000 mg total) by mouth 2 (two) times daily with a meal.    Allergies Allergies  Allergen Reactions   Egg-Derived Products    Lactose Intolerance (Gi)    Valtrex [Valacyclovir]     itching   Lipitor [Atorvastatin Calcium]     Nerves, muscle spasms, light stool    Past Medical History Past Medical History:  Diagnosis Date   Allergy    Diabetes mellitus without complication (HCC)    Diverticulitis    Fibroid    Hyperlipidemia    Hypertension    Migraines    Obesity, morbid, BMI 40.0-49.9 (HCC)    Sleep apnea    cpap    Past Surgical  History Past Surgical History:  Procedure Laterality Date   bone spur     removed   CESAREAN SECTION  1995   VAGINAL HYSTERECTOMY  1998   partial    Family History family history includes Aneurysm in her mother; Arthritis in her brother; Cancer (age of onset: 51) in her father; Coronary artery disease in her mother; Diabetes in her brother and sister; Heart attack in her brother; Heart disease in her mother; Hyperlipidemia in her sister; Hypertension in her brother, brother, mother, sister, and sister; Kidney disease (age of onset: 58) in her mother; Stroke in her mother.  Social History Social History   Socioeconomic History   Marital status: Divorced    Spouse name: Not on file   Number of children: 1   Years of education: 17   Highest education level: Bachelor's degree (e.g., BA, AB, BS)  Occupational History   Occupation: customer service   Tobacco Use   Smoking status: Every Day    Current packs/day: 0.50    Average packs/day: 0.5 packs/day for 32.0 years (16.0 ttl pk-yrs)    Types: Cigarettes   Smokeless tobacco: Never   Tobacco comments:    cutting back  Vaping Use   Vaping status: Never Used  Substance and Sexual Activity   Alcohol use: Yes    Comment: Occas wine   Drug use: Yes    Types: Marijuana    Comment: occ   Sexual activity: Not Currently    Partners: Male    Birth control/protection: Surgical, Condom  Other Topics Concern   Not on file  Social History Narrative   Exercise-- just starting to walk 4 -7 days a week , yoga    Social Determinants of Health   Financial Resource Strain: Low Risk  (01/16/2023)   Overall Financial Resource Strain (CARDIA)    Difficulty of Paying Living Expenses: Not hard at all  Food Insecurity: No Food Insecurity (01/16/2023)   Hunger Vital Sign  Worried About Programme researcher, broadcasting/film/video in the Last Year: Never true    Ran Out of Food in the Last Year: Never true  Transportation Needs: No Transportation Needs (01/16/2023)    PRAPARE - Administrator, Civil Service (Medical): No    Lack of Transportation (Non-Medical): No  Physical Activity: Insufficiently Active (01/16/2023)   Exercise Vital Sign    Days of Exercise per Week: 4 days    Minutes of Exercise per Session: 30 min  Stress: No Stress Concern Present (01/16/2023)   [employer] of Occupational Health - Occupational Stress Questionnaire    Feeling of Stress : Not at all  Social Connections: Moderately Integrated (01/16/2023)   Social Connection and Isolation Panel [NHANES]    Frequency of Communication with Friends and Family: More than three times a week    Frequency of Social Gatherings with Friends and Family: Once a week    Attends Religious Services: More than 4 times per year    Active Member of Clubs or Organizations: Yes    Attends Banker Meetings: More than 4 times per year    Marital Status: Divorced  Intimate Partner Violence: Not on file    Lab Results  Component Value Date   HGBA1C 5.3 02/23/2023   HGBA1C 5.5 01/20/2023   HGBA1C 10.5 (H) 10/20/2022   Lab Results  Component Value Date   CHOL 161 02/23/2023   Lab Results  Component Value Date   HDL 43.90 02/23/2023   Lab Results  Component Value Date   LDLCALC 99 02/23/2023   Lab Results  Component Value Date   TRIG 90.0 02/23/2023   Lab Results  Component Value Date   CHOLHDL 4 02/23/2023   Lab Results  Component Value Date   CREATININE 0.64 02/23/2023   Lab Results  Component Value Date   GFR 98.26 02/23/2023   Lab Results  Component Value Date   MICROALBUR 24.3 (H) 02/23/2023      Component Value Date/Time   NA 143 02/23/2023 0952   NA 139 10/21/2017 0000   K 3.5 02/23/2023 0952   CL 107 02/23/2023 0952   CO2 28 02/23/2023 0952   GLUCOSE 97 02/23/2023 0952   BUN 16 02/23/2023 0952   BUN 10 10/21/2017 0000   CREATININE 0.64 02/23/2023 0952   CREATININE 0.76 03/31/2012 1853   CALCIUM 9.8 02/23/2023 0952   PROT 7.8  02/23/2023 0952   ALBUMIN 4.2 02/23/2023 0952   AST 14 02/23/2023 0952   ALT 13 02/23/2023 0952   ALKPHOS 60 02/23/2023 0952   BILITOT 0.4 02/23/2023 0952   GFRNONAA >60 10/19/2022 1640   GFRAA >90 08/22/2012 1356      Latest Ref Rng & Units 02/23/2023    9:52 AM 01/20/2023    8:51 AM 10/20/2022    4:02 PM  BMP  Glucose 70 - 99 mg/dL 97  85  191   BUN 6 - 23 mg/dL 16  10  19    Creatinine 0.40 - 1.20 mg/dL 4.78  2.95  6.21   Sodium 135 - 145 mEq/L 143  141  133   Potassium 3.5 - 5.1 mEq/L 3.5  3.9  4.0   Chloride 96 - 112 mEq/L 107  104  98   CO2 19 - 32 mEq/L 28  29  25    Calcium 8.4 - 10.5 mg/dL 9.8  9.4  30.8        Component Value Date/Time   WBC 6.1 02/26/2023 0803  RBC 3.69 (L) 02/26/2023 0803   HGB 11.3 (L) 02/26/2023 0803   HCT 34.6 (L) 02/26/2023 0803   PLT 307.0 02/26/2023 0803   MCV 94.0 02/26/2023 0803   MCV 100.1 (A) 03/31/2012 1854   MCH 31.1 10/19/2022 1640   MCHC 32.7 02/26/2023 0803   RDW 15.4 02/26/2023 0803   LYMPHSABS 2.7 02/26/2023 0803   MONOABS 0.4 02/26/2023 0803   EOSABS 0.2 02/26/2023 0803   BASOSABS 0.0 02/26/2023 0803     Parts of this note may have been dictated using voice recognition software. There may be variances in spelling and vocabulary which are unintentional. Not all errors are proofread. Please notify the Thereasa Parkin if any discrepancies are noted or if the meaning of any statement is not clear.

## 2023-03-03 ENCOUNTER — Encounter: Payer: Self-pay | Admitting: Family Medicine

## 2023-03-03 NOTE — Telephone Encounter (Signed)
Care team updated and letter sent for eye exam notes.

## 2023-03-12 ENCOUNTER — Encounter: Payer: Self-pay | Admitting: Internal Medicine

## 2023-03-12 ENCOUNTER — Ambulatory Visit: Payer: BC Managed Care – PPO | Attending: Internal Medicine | Admitting: Internal Medicine

## 2023-03-12 VITALS — BP 128/80 | HR 73 | Ht 64.0 in | Wt 266.0 lb

## 2023-03-12 DIAGNOSIS — Z79899 Other long term (current) drug therapy: Secondary | ICD-10-CM

## 2023-03-12 DIAGNOSIS — E785 Hyperlipidemia, unspecified: Secondary | ICD-10-CM

## 2023-03-12 DIAGNOSIS — R079 Chest pain, unspecified: Secondary | ICD-10-CM

## 2023-03-12 DIAGNOSIS — Z8249 Family history of ischemic heart disease and other diseases of the circulatory system: Secondary | ICD-10-CM

## 2023-03-12 DIAGNOSIS — E1165 Type 2 diabetes mellitus with hyperglycemia: Secondary | ICD-10-CM | POA: Diagnosis not present

## 2023-03-12 MED ORDER — METOPROLOL TARTRATE 100 MG PO TABS: 100.0000 mg | ORAL_TABLET | ORAL | 0 refills | Status: DC

## 2023-03-12 NOTE — Patient Instructions (Signed)
Medication Instructions:  Take a ONE-TIME dose of metoprolol tartrate 2 hours before CT test  *If you need a refill on your cardiac medications before your next appointment, please call your pharmacy*   Lab Work: Non-Fasting BMET   FASTING lab work in 6 months -- NMR lipoprofile and LPa  If you have labs (blood work) drawn today and your tests are completely normal, you will receive your results only by: MyChart Message (if you have MyChart) OR A paper copy in the mail If you have any lab test that is abnormal or we need to change your treatment, we will call you to review the results.   Testing/Procedures: Coronary CT at [facility]   Follow-Up: At [facility], you and your health needs are our priority.  As part of our continuing mission to provide you with exceptional heart care, we have created designated Provider Care Teams.  These Care Teams include your primary Cardiologist (physician) and Advanced Practice Providers (APPs -  Physician Assistants and Nurse Practitioners) who all work together to provide you with the care you need, when you need it.  We recommend signing up for the patient portal called "MyChart".  Sign up information is provided on this After Visit Summary.  MyChart is used to connect with patients for Virtual Visits (Telemedicine).  Patients are able to view lab/test results, encounter notes, upcoming appointments, etc.  Non-urgent messages can be sent to your provider as well.   To learn more about what you can do with MyChart, go to [email or web address].    Your next appointment:    6 months with Dr. Rennis Golden Other Instructions   Your cardiac CT will be scheduled at one of the below locations:   [facility] [address] [place], [place] [address] [phone]  OR  [facility] [address] [place], [place] [address] [phone]  OR   [facility] [address] [place], [place] [address] [phone]  If scheduled at [facility], please arrive at the [facility] and Children's Entrance (Entrance C2) of [facility] 30 minutes prior to test start time. You can use the FREE valet parking offered at entrance C (encouraged to control the heart rate for the test)  Proceed to the [facility] Radiology Department (first floor) to check-in and test prep.  All radiology patients and guests should use entrance C2 at [facility], accessed from [facility], even though the hospital's physical address listed is [address].    If scheduled at [facility] or [facility], please arrive 15 mins early for check-in and test prep.  There is spacious parking and easy access to the radiology department from the [facility] Heart and Vascular entrance. Please enter here and check-in with the desk attendant.   Please follow these instructions carefully (unless otherwise directed):  An IV will be required for this test and Nitroglycerin will be given.  Hold all erectile dysfunction medications at least 3 days (72 hrs) prior to test. (Ie viagra, cialis, sildenafil, tadalafil, etc)   On the Night Before the Test: Be sure to Drink plenty of water. Do not consume any caffeinated/decaffeinated beverages or chocolate 12 hours prior to your test. Do not take any antihistamines 12 hours prior to your test.  On the Day of the Test: Drink plenty of water until 1 hour prior to the test. Do not eat any food  1 hour prior to test. You may take your regular medications prior to the test.  Take metoprolol (Lopressor) two hours prior to test. If you take Furosemide/Hydrochlorothiazide/Spironolactone, please HOLD on the morning of the test. FEMALES- please wear underwire-free bra if available, avoid dresses & tight clothing       After the Test: Drink plenty of  water. After receiving IV contrast, you may experience a mild flushed feeling. This is normal. On occasion, you may experience a mild rash up to 24 hours after the test. This is not dangerous. If this occurs, you can take Benadryl 25 mg and increase your fluid intake. If you experience trouble breathing, this can be serious. If it is severe call 911 IMMEDIATELY. If it is mild, please call our office. If you take any of these medications: Glipizide/Metformin, Avandament, Glucavance, please do not take 48 hours after completing test unless otherwise instructed.  We will call to schedule your test 2-4 weeks out understanding that some insurance companies will need an authorization prior to the service being performed.   For more information and frequently asked questions, please visit our website : [email or web address]  For non-scheduling related questions, please contact the cardiac imaging nurse navigator should you have any questions/concerns: Cardiac Imaging Nurse Navigators Direct Office Dial: [phone]   For scheduling needs, including cancellations and rescheduling, please call [place], [phone].

## 2023-03-12 NOTE — Progress Notes (Signed)
LIPID CLINIC CONSULT NOTE  Chief Complaint:  Manage dyslipidemia  Primary Care Physician: Zola Button, Grayling Congress, DO  Primary Cardiologist:  None  HPI:  Tiffany Gallagher is a 57 y.o. female who is being seen today for the evaluation of dyslipidemia at the request of Donato Schultz, *.  This a pleasant 57 year old female kindly referred for evaluation management of dyslipidemia.  She has a history of type 2 diabetes recently on insulin with a very low A1c at 5.4%.  She also has a history of statin intolerance but recently was switched to Livalo 2 mg daily.  She seems to tolerate this well and in fact is had further improvement in her cholesterol.  Total now 161, triglycerides 90, HDL 43 and LDL 99.  Her target LDL is less than 70.  Additionally she has been on [facility] and has noted weight loss with this.  During this visit, she also mention that she had been having some chest discomfort.  This is described as sharp and sometimes in her chest and in her shoulder.  It is typically not as common with exertion and may occur at rest.  Although it sounds less likely to be angina, she does have multiple cardiovascular risk factors including diabetes.  There is family history of heart disease in her mother.  PMHx:  Past Medical History:  Diagnosis Date   Allergy    Diabetes mellitus without complication (HCC)    Diverticulitis    Fibroid    Hyperlipidemia    Hypertension    Migraines    Obesity, morbid, BMI 40.0-49.9 (HCC)    Sleep apnea    cpap    Past Surgical History:  Procedure Laterality Date   bone spur     removed   CESAREAN SECTION  1995   VAGINAL HYSTERECTOMY  1998   partial    FAMHx:  Family History  Problem Relation Age of Onset   Coronary artery disease Mother    Hypertension Mother    Kidney disease Mother 74       dialysis   Aneurysm Mother        brain   Stroke Mother    Heart disease Mother        MI   Cancer Father 71       brain cancer    Diabetes Sister    Hyperlipidemia Sister    Hypertension Sister    Hypertension Sister    Arthritis Brother    Hypertension Brother    Diabetes Brother    Hypertension Brother    Heart attack Brother     SOCHx:   reports that she has been smoking cigarettes. She has a 16 pack-year smoking history. She has never used smokeless tobacco. She reports current alcohol use. She reports current drug use. Drug: Marijuana.  ALLERGIES:  Allergies  Allergen Reactions   Egg-Derived Products    Lactose Intolerance (Gi)    Valtrex [Valacyclovir]     itching   Lipitor [Atorvastatin Calcium]     Nerves, muscle spasms, light stool    ROS: Pertinent items noted in HPI and remainder of comprehensive ROS otherwise negative.  HOME MEDS: Current Outpatient Medications on File Prior to Visit  Medication Sig Dispense Refill   Accu-Chek Softclix Lancets lancets 1 EACH BY DOES NOT APPLY ROUTE IN THE MORNING, AT NOON, AND AT BEDTIME 100 each 2   acyclovir ointment (ZOVIRAX) 5 % Apply three times per day as needed 30 g 2  albuterol (VENTOLIN HFA) 108 (90 Base) MCG/ACT inhaler Inhale 2 puffs into the lungs every 6 (six) hours as needed for wheezing or shortness of breath. 18 g 2   Ascorbic Acid (VITAMIN C) 100 MG tablet Take 100 mg by mouth daily.     benazepril (LOTENSIN) 20 MG tablet Take 1 tablet (20 mg total) by mouth daily. 90 tablet 3   Blood Glucose Monitoring Suppl DEVI 1 each by Does not apply route in the morning, at noon, and at bedtime. May substitute to any manufacturer covered by patient's insurance. 1 each 0   CALCIUM PO Take 1 tablet by mouth daily.     Glucose Blood (BLOOD GLUCOSE TEST STRIPS) STRP 1 each by In Vitro route 5 (five) times daily. May substitute to any manufacturer covered by patient's insurance. 300 strip 2   hydrochlorothiazide (HYDRODIURIL) 25 MG tablet Take 1 tablet (25 mg total) by mouth daily. 90 tablet 3   insulin aspart (NOVOLOG FLEXPEN) 100 UNIT/ML FlexPen Per  sliding scale ---  no more than 40u/ day (Patient taking differently: Inject 5 Units into the skin 3 (three) times daily with meals. Per sliding scale ---  no more than 40u/ day) 15 mL 11   insulin glargine (LANTUS SOLOSTAR) 100 UNIT/ML Solostar Pen Inject 15 Units into the skin daily. Use sliding scale provided to Pt (Patient taking differently: Inject 12 Units into the skin daily. Use sliding scale provided to Pt) 15 mL 2   Insulin Pen Needle (BD PEN NEEDLE NANO 2ND GEN) 32G X 4 MM MISC To use w/ Lantus 100 each 1   metFORMIN (GLUCOPHAGE-XR) 500 MG 24 hr tablet Take 2 tablets (1,000 mg total) by mouth 2 (two) times daily with a meal. 120 tablet 3   Multiple Vitamin (MULTIVITAMIN WITH MINERALS) TABS Take 1 tablet by mouth daily.     omeprazole (PRILOSEC) 20 MG capsule Take 1 capsule (20 mg total) by mouth daily. 90 capsule 3   OVER THE COUNTER MEDICATION Take by mouth daily. amway multivitamin     OVER THE COUNTER MEDICATION Take by mouth daily. amway joint supplement     Pitavastatin Calcium 2 MG TABS Take 1 tablet (2 mg total) by mouth daily. 90 tablet 3   thiamine (VITAMIN B-1) 100 MG tablet Take 100 mg by mouth daily.     tirzepatide [facility]) 2.5 MG/0.5ML Pen Inject 2.5 mg into the skin once a week. 2 mL 3   triamcinolone cream (KENALOG) 0.1 % Apply 1 Application topically 2 (two) times daily.     VITAMIN D PO Take 4,000 Units by mouth daily.     No current facility-administered medications on file prior to visit.    LABS/IMAGING: No results found for this or any previous visit (from the past 48 hour(s)). No results found.  LIPID PANEL:    Component Value Date/Time   CHOL 161 02/23/2023 0952   TRIG 90.0 02/23/2023 0952   HDL 43.90 02/23/2023 0952   CHOLHDL 4 02/23/2023 0952   VLDL 18.0 02/23/2023 0952   LDLCALC 99 02/23/2023 0952   LDLDIRECT 172.0 10/20/2022 1602    WEIGHTS: Wt Readings from Last 3 Encounters:  03/12/23 266 lb (120.7 kg)  02/27/23 267 lb (121.1 kg)   01/20/23 274 lb (124.3 kg)    VITALS: BP 128/80 (BP Location: Left Arm, Patient Position: Sitting, Cuff Size: Large)   Pulse 73   Ht 5\' 4"  (1.626 m)   Wt 266 lb (120.7 kg)   SpO2 100%  BMI 45.66 kg/m   EXAM: Deferred  EKG: Deferred  ASSESSMENT: Chest pain, possibly cardiac Mixed dyslipidemia, goal LDL less than 70 Type 2 diabetes on insulin Morbid obesity with weight loss efforts in place Hypertension OSA on CPAP  PLAN: 1.   Ms. Brause has had improvement in her lipids on Livalo.  Her weight is coming down with active efforts and her blood sugars have been well-controlled.  I think that she may have more improvement in her lipids and I would recommend repeating those lipids including an LP(a) in about 6 months.  Other options would be to consider adding ezetimibe to her Livalo for additional benefit or increasing the Livalo from 2 to 4 mg depending on her lipids.  With regards to her chest pain, does seem somewhat atypical however she has multiple cardiovascular risk factors.  Based on this I would advise a coronary CT angiogram to further evaluate this.  There is a strong family history of heart disease in her mother.  Follow-up with me afterwards.  Thanks for the kind referral.  Plan follow-up with me in 6 months.  Chrystie Nose, MD, [facility], FACP  [place]  [facility]  Medical Director of the [facility] Lipid [facility] &  Cardiovascular Risk Reduction Clinic Diplomate of the American Board of Clinical Lipidology Attending Cardiologist  Direct Dial: [phone]  Fax: [phone]  Website:  [email or web address] 03/12/2023, 4:54 PM

## 2023-03-13 LAB — BASIC METABOLIC PANEL
BUN/Creatinine Ratio: 16 (ref 9–23)
BUN: 13 mg/dL (ref 6–24)
CO2: 25 mmol/L (ref 20–29)
Calcium: 9.8 mg/dL (ref 8.7–10.2)
Chloride: 106 mmol/L (ref 96–106)
Creatinine, Ser: 0.8 mg/dL (ref 0.57–1.00)
Glucose: 82 mg/dL (ref 70–99)
Potassium: 3.9 mmol/L (ref 3.5–5.2)
Sodium: 145 mmol/L — ABNORMAL HIGH (ref 134–144)
eGFR: 86 mL/min/{1.73_m2} (ref >59)

## 2023-04-01 ENCOUNTER — Other Ambulatory Visit: Payer: Self-pay | Admitting: Family Medicine

## 2023-04-01 DIAGNOSIS — E1165 Type 2 diabetes mellitus with hyperglycemia: Secondary | ICD-10-CM

## 2023-04-02 ENCOUNTER — Ambulatory Visit (HOSPITAL_COMMUNITY)
Admission: EM | Admit: 2023-04-02 | Discharge: 2023-04-02 | Disposition: A | Discharge status: Home / Self Care | Payer: BC Managed Care – PPO | Attending: Family Medicine | Admitting: Family Medicine

## 2023-04-02 ENCOUNTER — Ambulatory Visit (HOSPITAL_COMMUNITY): Payer: BC Managed Care – PPO

## 2023-04-02 ENCOUNTER — Encounter (HOSPITAL_COMMUNITY): Payer: Self-pay | Admitting: *Deleted

## 2023-04-02 DIAGNOSIS — J019 Acute sinusitis, unspecified: Secondary | ICD-10-CM | POA: Diagnosis not present

## 2023-04-02 DIAGNOSIS — R079 Chest pain, unspecified: Secondary | ICD-10-CM | POA: Diagnosis not present

## 2023-04-02 DIAGNOSIS — J4521 Mild intermittent asthma with (acute) exacerbation: Secondary | ICD-10-CM

## 2023-04-02 DIAGNOSIS — R0989 Other specified symptoms and signs involving the circulatory and respiratory systems: Secondary | ICD-10-CM | POA: Diagnosis not present

## 2023-04-02 DIAGNOSIS — R059 Cough, unspecified: Secondary | ICD-10-CM | POA: Diagnosis not present

## 2023-04-02 MED ORDER — KETOROLAC TROMETHAMINE 30 MG/ML IJ SOLN
30.0000 mg | Freq: Once | INTRAMUSCULAR | Status: AC
  Administered 2023-04-02: 30 mg via INTRAMUSCULAR

## 2023-04-02 MED ORDER — DOXYCYCLINE HYCLATE 100 MG PO CAPS: 100.0000 mg | ORAL_CAPSULE | Freq: Two times a day (BID) | ORAL | 0 refills | Status: AC

## 2023-04-02 MED ORDER — BENZONATATE 100 MG PO CAPS: 100.0000 mg | ORAL_CAPSULE | Freq: Three times a day (TID) | ORAL | 0 refills | Status: DC | PRN

## 2023-04-02 MED ORDER — PREDNISONE 20 MG PO TABS: 40.0000 mg | ORAL_TABLET | Freq: Every day | ORAL | 0 refills | Status: AC

## 2023-04-02 MED ORDER — ALBUTEROL SULFATE HFA 108 (90 BASE) MCG/ACT IN AERS: 2.0000 | INHALATION_SPRAY | RESPIRATORY_TRACT | 0 refills | Status: AC | PRN

## 2023-04-02 MED ORDER — KETOROLAC TROMETHAMINE 30 MG/ML IJ SOLN
INTRAMUSCULAR | Status: AC
  Filled 2023-04-02: qty 1

## 2023-04-02 NOTE — Discharge Instructions (Signed)
By my review the chest x-ray is clear. The radiologist will also read your x-ray, and if their interpretation differs significantly from mine, we will call you.  Albuterol inhaler--do 2 puffs every 4 hours as needed for shortness of breath or wheezing  Take prednisone 20 mg--2 daily for 5 days  Take benzonatate 100 mg, 1 tab every 8 hours as needed for cough.  Take doxycycline 100 mg --1 capsule 2 times daily for 7 days; this is an antibiotic.  I want to give this to you for possible sinus infection, but it would cover for pneumonia also very well.

## 2023-04-02 NOTE — ED Provider Notes (Signed)
[facility]    CSN: [id number] Arrival date & time: 04/02/23  1404      History   Chief Complaint Chief Complaint  Patient presents with   Chest Pain   Cough   Nasal Congestion   Fatigue    HPI Tiffany Gallagher is a 57 y.o. female.    Chest Pain Associated symptoms: cough   Cough Associated symptoms: chest pain   Here for cough and congestion and chest pain.  On December 1 began having nasal drainage and cough and congestion.  She had a lot of rhinorrhea and postnasal drainage.  She did have some initial subjective fever that has resolved.  After about 5 or 6 days it improved and there were just some clear drainage, but today she has had more cough and is having some chest pain in her central chest that worsens with cough or with deep breath.  She does have a history of asthma and cannot find her inhaler.  She also has a history of diabetes. She is allergic to Valtrex and Lipitor  Past Medical History:  Diagnosis Date   Allergy    Diabetes mellitus without complication (HCC)    Diverticulitis    Fibroid    Hyperlipidemia    Hypertension    Migraines    Obesity, morbid, BMI 40.0-49.9 (HCC)    Sleep apnea    cpap    Patient Active Problem List   Diagnosis Date Noted   Type 2 diabetes mellitus with hyperglycemia, without long-term current use of insulin (HCC) 10/20/2022   History of total vaginal hysterectomy (TVH) 07/04/2020   Healthcare maintenance 04/26/2020   It band syndrome, right 04/24/2020   Hyperlipidemia 09/19/2019   Mild intermittent asthma without complication 09/19/2019   Hair loss 09/19/2019   Dyspepsia 09/19/2019   Right hip pain 09/19/2019   Leg skin lesion, right 06/09/2017   OSA (obstructive sleep apnea) 06/05/2016   Preventative health care 02/09/2015   Acute bacterial bronchitis 11/15/2014   Severe obesity (BMI >= 40) (HCC) 05/10/2014   Physical exam 01/20/2014   HSV-1 infection 06/20/2013   Migraines    Smoker     Sinusitis 04/11/2011   Leg cramps 09/18/2010   Edema 09/18/2010   VAGINAL DISCHARGE 09/20/2009   KNEE PAIN, BILATERAL 09/20/2009   FLATULENCE ERUCTATION AND GAS PAIN 08/03/2009   HEMATURIA, HX OF 07/04/2009   Essential hypertension 02/07/2008   GERD 11/30/2006    Past Surgical History:  Procedure Laterality Date   bone spur     removed   CESAREAN SECTION  1995   VAGINAL HYSTERECTOMY  1998   partial    OB History     Gravida  2   Para  1   Term  1   Preterm      AB  1   Living  1      SAB      IAB      Ectopic      Multiple      Live Births               Home Medications    Prior to Admission medications   Medication Sig Start Date End Date Taking? Authorizing Provider  albuterol (VENTOLIN HFA) 108 (90 Base) MCG/ACT inhaler Inhale 2 puffs into the lungs every 4 (four) hours as needed for wheezing or shortness of breath. 04/02/23  Yes Zenia Resides, MD  Ascorbic Acid (VITAMIN C) 100 MG tablet Take 100 mg by mouth  daily.   Yes [provider]  benazepril (LOTENSIN) 20 MG tablet Take 1 tablet (20 mg total) by mouth daily. 01/20/23  Yes Seabron Spates R, DO  benzonatate (TESSALON) 100 MG capsule Take 1 capsule (100 mg total) by mouth 3 (three) times daily as needed for cough. 04/02/23  Yes Zenia Resides, MD  doxycycline (VIBRAMYCIN) 100 MG capsule Take 1 capsule (100 mg total) by mouth 2 (two) times daily for 7 days. 04/02/23 04/09/23 Yes Zenia Resides, MD  hydrochlorothiazide (HYDRODIURIL) 25 MG tablet Take 1 tablet (25 mg total) by mouth daily. 01/20/23  Yes Seabron Spates R, DO  insulin aspart (NOVOLOG FLEXPEN) 100 UNIT/ML FlexPen Per sliding scale ---  no more than 40u/ day Patient taking differently: Inject 5 Units into the skin 3 (three) times daily with meals. Per sliding scale ---  no more than 40u/ day 10/20/22  Yes Lowne Florina Ou R, DO  insulin glargine (LANTUS SOLOSTAR) 100 UNIT/ML Solostar Pen Inject 15 Units  into the skin daily. Use sliding scale provided to Pt Patient taking differently: Inject 12 Units into the skin daily. Use sliding scale provided to Pt 10/28/22  Yes Lowne Chase, Grayling Congress, DO  metFORMIN (GLUCOPHAGE-XR) 500 MG 24 hr tablet Take 2 tablets (1,000 mg total) by mouth 2 (two) times daily with a meal. 02/27/23 04/02/23 Yes Motwani, Komal, MD  Multiple Vitamin (MULTIVITAMIN WITH MINERALS) TABS Take 1 tablet by mouth daily.   Yes [provider]  OVER THE COUNTER MEDICATION Take by mouth daily. amway multivitamin   Yes [provider]  Pitavastatin Calcium 2 MG TABS Take 1 tablet (2 mg total) by mouth daily. 01/20/23  Yes Seabron Spates R, DO  predniSONE (DELTASONE) 20 MG tablet Take 2 tablets (40 mg total) by mouth daily with breakfast for 5 days. 04/02/23 04/07/23 Yes Zenia Resides, MD  thiamine (VITAMIN B-1) 100 MG tablet Take 100 mg by mouth daily.   Yes [provider]  VITAMIN D PO Take 4,000 Units by mouth daily.   Yes [provider]  Accu-Chek Softclix Lancets lancets 1 EACH BY DOES NOT APPLY ROUTE IN THE MORNING, AT NOON, AND AT BEDTIME 04/01/23   Zola Button, Grayling Congress, DO  acyclovir ointment (ZOVIRAX) 5 % Apply three times per day as needed 07/14/22   Wyline Beady A, NP  Blood Glucose Monitoring Suppl DEVI 1 each by Does not apply route in the morning, at noon, and at bedtime. May substitute to any manufacturer covered by patient's insurance. 10/20/22   Seabron Spates R, DO  CALCIUM PO Take 1 tablet by mouth daily.    [provider]  Glucose Blood (BLOOD GLUCOSE TEST STRIPS) STRP 1 each by In Vitro route 5 (five) times daily. May substitute to any manufacturer covered by patient's insurance. 11/03/22 05/02/23  Donato Schultz, DO  Insulin Pen Needle (BD PEN NEEDLE NANO 2ND GEN) 32G X 4 MM MISC To use w/ Lantus 02/16/23   Zola Button, Grayling Congress, DO  metoprolol tartrate (LOPRESSOR) 100 MG tablet Take 1 tablet (100 mg total)  by mouth as directed. Take TWO HOURS prior to CT test 03/12/23 06/10/23  Chrystie Nose, MD  omeprazole (PRILOSEC) 20 MG capsule Take 1 capsule (20 mg total) by mouth daily. 04/28/22 09/04/24  Seabron Spates R, DO  OVER THE COUNTER MEDICATION Take by mouth daily. amway joint supplement    [provider]  tirzepatide [facility]gg[facility] Keen) 2.5 MG/0.5ML Pen  Inject 2.5 mg into the skin once a week. 02/27/23   Motwani, Carin Hock, MD  triamcinolone cream (KENALOG) 0.1 % Apply 1 Application topically 2 (two) times daily. 02/09/23   [provider]    Family History Family History  Problem Relation Age of Onset   Coronary artery disease Mother    Hypertension Mother    Kidney disease Mother 54       dialysis   Aneurysm Mother        brain   Stroke Mother    Heart disease Mother        MI   Cancer Father 65       brain cancer   Diabetes Sister    Hyperlipidemia Sister    Hypertension Sister    Hypertension Sister    Arthritis Brother    Hypertension Brother    Diabetes Brother    Hypertension Brother    Heart attack Brother     Social History Social History   Tobacco Use   Smoking status: Every Day    Current packs/day: 0.50    Average packs/day: 0.5 packs/day for 32.0 years (16.0 ttl pk-yrs)    Types: Cigarettes   Smokeless tobacco: Never   Tobacco comments:    cutting back  Vaping Use   Vaping status: Never Used  Substance Use Topics   Alcohol use: Yes    Comment: Occas wine   Drug use: Yes    Types: Marijuana    Comment: occ     Allergies   Egg-derived products, Lactose intolerance (gi), Milk-related compounds, Valtrex [valacyclovir], and Lipitor [atorvastatin calcium]   Review of Systems Review of Systems  Respiratory:  Positive for cough.   Cardiovascular:  Positive for chest pain.     Physical Exam Triage Vital Signs ED Triage Vitals  Encounter Vitals Group     BP 04/02/23 1508 128/83     Systolic BP Percentile --      Diastolic BP Percentile  --      Pulse Rate 04/02/23 1508 78     Resp 04/02/23 1508 18     Temp 04/02/23 1508 98.1 F (36.7 C)     Temp Source 04/02/23 1508 Oral     SpO2 04/02/23 1508 95 %     Weight --      Height --      Head Circumference --      Peak Flow --      Pain Score 04/02/23 1504 3     Pain Loc --      Pain Education --      Exclude from Growth Chart --    No data found.  Updated Vital Signs BP 128/83 (BP Location: Left Arm)   Pulse 78   Temp 98.1 F (36.7 C) (Oral)   Resp 18   SpO2 95%   Visual Acuity Right Eye Distance:   Left Eye Distance:   Bilateral Distance:    Right Eye Near:   Left Eye Near:    Bilateral Near:     Physical Exam Vitals reviewed.  Constitutional:      General: She is not in acute distress.    Appearance: She is not ill-appearing, toxic-appearing or diaphoretic.  HENT:     Right Ear: Tympanic membrane and ear canal normal.     Left Ear: Tympanic membrane and ear canal normal.     Nose: Congestion present.     Mouth/Throat:     Mouth: Mucous membranes are moist.  Pharynx: No oropharyngeal exudate or posterior oropharyngeal erythema.  Eyes:     Extraocular Movements: Extraocular movements intact.     Conjunctiva/sclera: Conjunctivae normal.     Pupils: Pupils are equal, round, and reactive to light.  Cardiovascular:     Rate and Rhythm: Normal rate and regular rhythm.     Heart sounds: No murmur heard. Pulmonary:     Effort: No respiratory distress.     Breath sounds: No stridor. No rhonchi or rales.     Comments: There are coarse breath sounds superiorly and in the right lower lung field breath sounds are diminished.  No wheezes at the time of exam Musculoskeletal:     Cervical back: Neck supple.  Lymphadenopathy:     Cervical: No cervical adenopathy.  Skin:    Capillary Refill: Capillary refill takes less than 2 seconds.     Coloration: Skin is not jaundiced or pale.  Neurological:     General: No focal deficit present.     Mental  Status: She is alert and oriented to person, place, and time.  Psychiatric:        Behavior: Behavior normal.      UC Treatments / Results  Labs (all labs ordered are listed, but only abnormal results are displayed) Labs Reviewed - No data to display  EKG   Radiology No results found.  Procedures Procedures (including critical care time)  Medications Ordered in UC Medications  ketorolac (TORADOL) 30 MG/ML injection 30 mg (has no administration in time range)    Initial Impression / Assessment and Plan / UC Course  I have reviewed the triage vital signs and the nursing notes.  Pertinent labs & imaging results that were available during my care of the patient were reviewed by me and considered in my medical decision making (see chart for details).     Chest x-ray by my review is clear, but I am going to treat her with antibiotics for possible sinus infection.  She is advised of radiology over read.  Also prednisone and albuterol sent in for asthma exacerbation and a Toradol injection is given here today. Final Clinical Impressions(s) / UC Diagnoses   Final diagnoses:  Mild intermittent asthma with (acute) exacerbation  Acute sinusitis, recurrence not specified, unspecified location  Chest pain, unspecified type     Discharge Instructions      By my review the chest x-ray is clear. The radiologist will also read your x-ray, and if their interpretation differs significantly from mine, we will call you.  Albuterol inhaler--do 2 puffs every 4 hours as needed for shortness of breath or wheezing  Take prednisone 20 mg--2 daily for 5 days  Take benzonatate 100 mg, 1 tab every 8 hours as needed for cough.  Take doxycycline 100 mg --1 capsule 2 times daily for 7 days; this is an antibiotic.  I want to give this to you for possible sinus infection, but it would cover for pneumonia also very well.     ED Prescriptions     Medication Sig Dispense Auth. Provider    albuterol (VENTOLIN HFA) 108 (90 Base) MCG/ACT inhaler Inhale 2 puffs into the lungs every 4 (four) hours as needed for wheezing or shortness of breath. 1 each Zenia Resides, MD   benzonatate (TESSALON) 100 MG capsule Take 1 capsule (100 mg total) by mouth 3 (three) times daily as needed for cough. 21 capsule Zenia Resides, MD   doxycycline (VIBRAMYCIN) 100 MG capsule Take 1 capsule (100  mg total) by mouth 2 (two) times daily for 7 days. 14 capsule Zenia Resides, MD   predniSONE (DELTASONE) 20 MG tablet Take 2 tablets (40 mg total) by mouth daily with breakfast for 5 days. 10 tablet Marlinda Mike Janace Aris, MD      PDMP not reviewed this encounter.   Zenia Resides, MD 04/02/23 [phone]

## 2023-04-02 NOTE — ED Triage Notes (Signed)
Pt states she has been coughing, congested, since the Sunday after Thanksgiving. She has been taking mucinex and tylenol. She now has some chest tightness and soreness. She states she is so fatigued she can't stay awake.    She has an albuterol MDI but states she did not think to use it.

## 2023-04-03 ENCOUNTER — Ambulatory Visit (INDEPENDENT_AMBULATORY_CARE_PROVIDER_SITE_OTHER): Payer: BC Managed Care – PPO | Admitting: "Endocrinology

## 2023-04-03 ENCOUNTER — Encounter: Payer: Self-pay | Admitting: "Endocrinology

## 2023-04-03 VITALS — BP 140/84 | HR 82 | Ht 64.0 in | Wt 261.8 lb

## 2023-04-03 DIAGNOSIS — Z7984 Long term (current) use of oral hypoglycemic drugs: Secondary | ICD-10-CM

## 2023-04-03 DIAGNOSIS — E7800 Pure hypercholesterolemia, unspecified: Secondary | ICD-10-CM

## 2023-04-03 DIAGNOSIS — E1140 Type 2 diabetes mellitus with diabetic neuropathy, unspecified: Secondary | ICD-10-CM | POA: Diagnosis not present

## 2023-04-03 DIAGNOSIS — Z794 Long term (current) use of insulin: Secondary | ICD-10-CM | POA: Diagnosis not present

## 2023-04-03 MED ORDER — TIRZEPATIDE 2.5 MG/0.5ML ~~LOC~~ SOAJ: 2.5000 mg | SUBCUTANEOUS | 0 refills | Status: DC

## 2023-04-03 NOTE — Progress Notes (Signed)
Outpatient Endocrinology Note Tiffany [place], MD  04/03/23   Tiffany Gallagher Oct 18, 1965 [id number]  Referring Provider: Zola Button, Grayling Congress, * Primary Care Provider: Zola Button, Grayling Congress, Gallagher Reason for consultation: Subjective   Assessment & Plan  Diagnoses and all orders for this visit:  Type 2 diabetes mellitus with diabetic neuropathy, with long-term current use of insulin (HCC)  Long term (current) use of oral hypoglycemic drugs  Pure hypercholesterolemia  Other orders -     tirzepatide Tiffany Gallagher) 2.5 MG/0.5ML Pen; Inject 2.5 mg into the skin once a week.   Diabetes Type II complicated by neuropathy,  Lab Results  Component Value Date   GFR 98.26 02/23/2023   Hba1c goal less than 7, current Hba1c is  Lab Results  Component Value Date   HGBA1C 5.3 02/23/2023   Will recommend the following: Lantus 12 units a day Novolog 5 units tid (back off to 3 units or stop if blood sugar <70) Metformin XR 500 mg 2 pills bid (cannot tolerate regular full dose due to GI S/E) Start Mounjaro 2.5 mg/week  No known contraindications/side effects to any of above medications No history of MEN syndrome/medullary thyroid cancer/pancreatitis or pancreatic cancer in self or family  -Last LD and Tg are as follows: Lab Results  Component Value Date   LDLCALC 99 02/23/2023    Lab Results  Component Value Date   TRIG 90.0 02/23/2023   -On pitavastatin 2 mg QD -Follow low fat diet and exercise   -Blood pressure goal <140/90 - Microalbumin/creatinine goal is < 30 -Last MA/Cr is as follows: Lab Results  Component Value Date   MICROALBUR 24.3 (H) 02/23/2023   -on ACE/ARB benazepril 20 mg qd -diet changes including salt restriction -limit eating outside -counseled BP targets per standards of diabetes care -uncontrolled blood pressure can lead to retinopathy, nephropathy and cardiovascular and atherosclerotic heart disease  Reviewed and counseled on: -A1C  target -Blood sugar targets -Complications of uncontrolled diabetes  -Checking blood sugar before meals and bedtime and bring log next visit -All medications with mechanism of action and side effects -Hypoglycemia management: rule of 15's, Glucagon Emergency Kit and medical alert ID -low-carb low-fat plate-method diet -At least 20 minutes of physical activity per day -Annual dilated retinal eye exam and foot exam -compliance and follow up needs -follow up as scheduled or earlier if problem gets worse  Call if blood sugar is less than 70 or consistently above 250    Take a 15 gm snack of carbohydrate at bedtime before you go to sleep if your blood sugar is less than 100.    If you are going to fast after midnight for a test or procedure, ask your physician for instructions on how to reduce/decrease your insulin dose.    Call if blood sugar is less than 70 or consistently above 250  -Treating a low sugar by rule of 15  (15 gms of sugar every 15 min until sugar is more than 70) If you feel your sugar is low, test your sugar to be sure If your sugar is low (less than 70), then take 15 grams of a fast acting Carbohydrate (3-4 glucose tablets or glucose gel or 4 ounces of juice or regular soda) Recheck your sugar 15 min after treating low to make sure it is more than 70 If sugar is still less than 70, treat again with 15 grams of carbohydrate          Don't drive the hour  of hypoglycemia  If unconscious/unable to eat or drink by mouth, use glucagon injection or nasal spray baqsimi and call 911. Can repeat again in 15 min if still unconscious.  Return in about 3 weeks (around 04/24/2023).   I have reviewed current medications, nurse's notes, allergies, vital signs, past medical and surgical history, family medical history, and social history for this encounter. Counseled patient on symptoms, examination findings, lab findings, imaging results, treatment decisions and monitoring and prognosis.  The patient understood the recommendations and agrees with the treatment plan. All questions regarding treatment plan were fully answered.  Tiffany [place], MD  04/03/23   History of Present Illness Tiffany Gallagher is a 57 y.o. year old female who presents for follow up on Type II diabetes mellitus.  [place] Tiffany Gallagher was first diagnosed in 2024.   Diabetes education +  Home diabetes regimen: Lantus 12 units a day Novolog 5 units tid right before meals  Metformin XR 500 mg 2 pills bid - reports constipation   COMPLICATIONS -  MI/Stroke -  retinopathy +  neuropathy -  nephropathy  BLOOD SUGAR DATA 80-132 per meter Checks 3 times a day  Physical Exam  BP (!) 140/84   Pulse 82   Ht 5\' 4"  (1.626 m)   Wt 261 lb 12.8 oz (118.8 kg)   SpO2 98%   BMI 44.94 kg/m    Constitutional: well developed, well nourished Head: normocephalic, atraumatic Eyes: sclera anicteric, no redness Neck: supple Lungs: normal respiratory effort Neurology: alert and oriented Skin: dry, no appreciable rashes Musculoskeletal: no appreciable defects Psychiatric: normal mood and affect Diabetic Foot Exam - Simple   No data filed      Current Medications Patient's Medications  New Prescriptions   No medications on file  Previous Medications   ACCU-CHEK SOFTCLIX LANCETS LANCETS    1 EACH BY DOES NOT APPLY ROUTE IN THE MORNING, AT NOON, AND AT BEDTIME   ACYCLOVIR OINTMENT (ZOVIRAX) 5 %    Apply three times per day as needed   ALBUTEROL (VENTOLIN HFA) 108 (90 BASE) MCG/ACT INHALER    Inhale 2 puffs into the lungs every 4 (four) hours as needed for wheezing or shortness of breath.   ASCORBIC ACID (VITAMIN C) 100 MG TABLET    Take 100 mg by mouth daily.   BENAZEPRIL (LOTENSIN) 20 MG TABLET    Take 1 tablet (20 mg total) by mouth daily.   BENZONATATE (TESSALON) 100 MG CAPSULE    Take 1 capsule (100 mg total) by mouth 3 (three) times daily as needed for cough.   BLOOD GLUCOSE MONITORING SUPPL DEVI     1 each by Does not apply route in the morning, at noon, and at bedtime. May substitute to any manufacturer covered by patient's insurance.   CALCIUM PO    Take 1 tablet by mouth daily.   DOXYCYCLINE (VIBRAMYCIN) 100 MG CAPSULE    Take 1 capsule (100 mg total) by mouth 2 (two) times daily for 7 days.   GLUCOSE BLOOD (BLOOD GLUCOSE TEST STRIPS) STRP    1 each by In Vitro route 5 (five) times daily. May substitute to any manufacturer covered by patient's insurance.   HYDROCHLOROTHIAZIDE (HYDRODIURIL) 25 MG TABLET    Take 1 tablet (25 mg total) by mouth daily.   INSULIN ASPART (NOVOLOG FLEXPEN) 100 UNIT/ML FLEXPEN    Per sliding scale ---  no more than 40u/ day   INSULIN GLARGINE (LANTUS SOLOSTAR) 100 UNIT/ML SOLOSTAR PEN  Inject 15 Units into the skin daily. Use sliding scale provided to Pt   INSULIN PEN NEEDLE (BD PEN NEEDLE NANO 2ND GEN) 32G X 4 MM MISC    To use w/ Lantus   METFORMIN (GLUCOPHAGE-XR) 500 MG 24 HR TABLET    Take 2 tablets (1,000 mg total) by mouth 2 (two) times daily with a meal.   METOPROLOL TARTRATE (LOPRESSOR) 100 MG TABLET    Take 1 tablet (100 mg total) by mouth as directed. Take TWO HOURS prior to CT test   MULTIPLE VITAMIN (MULTIVITAMIN WITH MINERALS) TABS    Take 1 tablet by mouth daily.   OMEPRAZOLE (PRILOSEC) 20 MG CAPSULE    Take 1 capsule (20 mg total) by mouth daily.   OVER THE COUNTER MEDICATION    Take by mouth daily. [employer] multivitamin   OVER THE COUNTER MEDICATION    Take by mouth daily. [employer] joint supplement   PITAVASTATIN CALCIUM 2 MG TABS    Take 1 tablet (2 mg total) by mouth daily.   PREDNISONE (DELTASONE) 20 MG TABLET    Take 2 tablets (40 mg total) by mouth daily with breakfast for 5 days.   THIAMINE (VITAMIN B-1) 100 MG TABLET    Take 100 mg by mouth daily.   TRIAMCINOLONE CREAM (KENALOG) 0.1 %    Apply 1 Application topically 2 (two) times daily.   VITAMIN D PO    Take 4,000 Units by mouth daily.  Modified Medications   Modified Medication Previous  Medication   TIRZEPATIDE (MOUNJARO) 2.5 MG/0.5ML PEN tirzepatide (MOUNJARO) 2.5 MG/0.5ML Pen      Inject 2.5 mg into the skin once a week.    Inject 2.5 mg into the skin once a week.  Discontinued Medications   No medications on file    Allergies Allergies  Allergen Reactions   Egg-Derived Products    Lactose Intolerance (Gi)    Milk-Related Compounds Other (See Comments)   Valtrex [Valacyclovir]     itching   Lipitor [Atorvastatin Calcium]     Nerves, muscle spasms, light stool    Past Medical History Past Medical History:  Diagnosis Date   Allergy    Diabetes mellitus without complication (HCC)    Diverticulitis    Fibroid    Hyperlipidemia    Hypertension    Migraines    Obesity, morbid, BMI 40.0-49.9 (HCC)    Sleep apnea    cpap    Past Surgical History Past Surgical History:  Procedure Laterality Date   bone spur     removed   CESAREAN SECTION  1995   VAGINAL HYSTERECTOMY  1998   partial    Family History family history includes Aneurysm in her mother; Arthritis in her brother; Cancer (age of onset: 81) in her father; Coronary artery disease in her mother; Diabetes in her brother and sister; Heart attack in her brother; Heart disease in her mother; Hyperlipidemia in her sister; Hypertension in her brother, brother, mother, sister, and sister; Kidney disease (age of onset: 63) in her mother; Stroke in her mother.  Social History Social History   Socioeconomic History   Marital status: Divorced    Spouse name: Not on file   Number of children: 1   Years of education: 17   Highest education level: Bachelor's degree (e.g., BA, AB, BS)  Occupational History   Occupation: customer service   Tobacco Use   Smoking status: Every Day    Current packs/day: 0.50    Average packs/day: 0.5  packs/day for 32.0 years (16.0 ttl pk-yrs)    Types: Cigarettes   Smokeless tobacco: Never   Tobacco comments:    cutting back  Vaping Use   Vaping status: Never Used   Substance and Sexual Activity   Alcohol use: Yes    Comment: Occas wine   Drug use: Yes    Types: Marijuana    Comment: occ   Sexual activity: Not Currently    Partners: Male    Birth control/protection: Surgical, Condom  Other Topics Concern   Not on file  Social History Narrative   Exercise-- just starting to walk 4 -7 days a week , yoga    Social Drivers of Corporate investment banker Strain: Low Risk  (01/16/2023)   Overall Financial Resource Strain (CARDIA)    Difficulty of Paying Living Expenses: Not hard at all  Food Insecurity: No Food Insecurity (01/16/2023)   Hunger Vital Sign    Worried About Running Out of Food in the Last Year: Never true    Ran Out of Food in the Last Year: Never true  Transportation Needs: No Transportation Needs (01/16/2023)   PRAPARE - Administrator, Civil Service (Medical): No    Lack of Transportation (Non-Medical): No  Physical Activity: Insufficiently Active (01/16/2023)   Exercise Vital Sign    Days of Exercise per Week: 4 days    Minutes of Exercise per Session: 30 min  Stress: No Stress Concern Present (01/16/2023)   [employer] of Occupational Health - Occupational Stress Questionnaire    Feeling of Stress : Not at all  Social Connections: Moderately Integrated (01/16/2023)   Social Connection and Isolation Panel [NHANES]    Frequency of Communication with Friends and Family: More than three times a week    Frequency of Social Gatherings with Friends and Family: Once a week    Attends Religious Services: More than 4 times per year    Active Member of Clubs or Organizations: Yes    Attends Banker Meetings: More than 4 times per year    Marital Status: Divorced  Intimate Partner Violence: Not on file    Lab Results  Component Value Date   HGBA1C 5.3 02/23/2023   HGBA1C 5.5 01/20/2023   HGBA1C 10.5 (H) 10/20/2022   Lab Results  Component Value Date   CHOL 161 02/23/2023   Lab Results  Component  Value Date   HDL 43.90 02/23/2023   Lab Results  Component Value Date   LDLCALC 99 02/23/2023   Lab Results  Component Value Date   TRIG 90.0 02/23/2023   Lab Results  Component Value Date   CHOLHDL 4 02/23/2023   Lab Results  Component Value Date   CREATININE 0.80 03/12/2023   Lab Results  Component Value Date   GFR 98.26 02/23/2023   Lab Results  Component Value Date   MICROALBUR 24.3 (H) 02/23/2023      Component Value Date/Time   NA 145 (H) 03/12/2023 1501   K 3.9 03/12/2023 1501   CL 106 03/12/2023 1501   CO2 25 03/12/2023 1501   GLUCOSE 82 03/12/2023 1501   GLUCOSE 97 02/23/2023 0952   BUN 13 03/12/2023 1501   CREATININE 0.80 03/12/2023 1501   CREATININE 0.76 03/31/2012 1853   CALCIUM 9.8 03/12/2023 1501   PROT 7.8 02/23/2023 0952   ALBUMIN 4.2 02/23/2023 0952   AST 14 02/23/2023 0952   ALT 13 02/23/2023 0952   ALKPHOS 60 02/23/2023 0952   BILITOT  0.4 02/23/2023 0952   GFRNONAA >60 10/19/2022 1640   GFRAA >90 08/22/2012 1356      Latest Ref Rng & Units 03/12/2023    3:01 PM 02/23/2023    9:52 AM 01/20/2023    8:51 AM  BMP  Glucose 70 - 99 mg/dL 82  97  85   BUN 6 - 24 mg/dL 13  16  10    Creatinine 0.57 - 1.00 mg/dL 1.61  0.96  0.45   BUN/Creat Ratio 9 - 23 16     Sodium 134 - 144 mmol/L 145  143  141   Potassium 3.5 - 5.2 mmol/L 3.9  3.5  3.9   Chloride 96 - 106 mmol/L 106  107  104   CO2 20 - 29 mmol/L 25  28  29    Calcium 8.7 - 10.2 mg/dL 9.8  9.8  9.4        Component Value Date/Time   WBC 6.1 02/26/2023 0803   RBC 3.69 (L) 02/26/2023 0803   HGB 11.3 (L) 02/26/2023 0803   HCT 34.6 (L) 02/26/2023 0803   PLT 307.0 02/26/2023 0803   MCV 94.0 02/26/2023 0803   MCV 100.1 (A) 03/31/2012 1854   MCH 31.1 10/19/2022 1640   MCHC 32.7 02/26/2023 0803   RDW 15.4 02/26/2023 0803   LYMPHSABS 2.7 02/26/2023 0803   MONOABS 0.4 02/26/2023 0803   EOSABS 0.2 02/26/2023 0803   BASOSABS 0.0 02/26/2023 0803     Parts of this note may have been  dictated using voice recognition software. There may be variances in spelling and vocabulary which are unintentional. Not all errors are proofread. Please notify the Thereasa Parkin if any discrepancies are noted or if the meaning of any statement is not clear.

## 2023-04-11 ENCOUNTER — Encounter: Payer: Self-pay | Admitting: Family Medicine

## 2023-04-11 DIAGNOSIS — E1165 Type 2 diabetes mellitus with hyperglycemia: Secondary | ICD-10-CM

## 2023-04-11 DIAGNOSIS — E785 Hyperlipidemia, unspecified: Secondary | ICD-10-CM

## 2023-04-13 MED ORDER — BLOOD GLUCOSE TEST VI STRP: ORAL_STRIP | 12 refills | Status: DC

## 2023-04-29 DIAGNOSIS — G4733 Obstructive sleep apnea (adult) (pediatric): Secondary | ICD-10-CM | POA: Diagnosis not present

## 2023-04-30 ENCOUNTER — Encounter: Payer: Self-pay | Admitting: Family Medicine

## 2023-04-30 ENCOUNTER — Ambulatory Visit (INDEPENDENT_AMBULATORY_CARE_PROVIDER_SITE_OTHER): Payer: BC Managed Care – PPO | Admitting: Family Medicine

## 2023-04-30 VITALS — BP 120/80 | HR 77 | Temp 98.6°F | Resp 18 | Ht 64.0 in | Wt 262.6 lb

## 2023-04-30 DIAGNOSIS — Z23 Encounter for immunization: Secondary | ICD-10-CM

## 2023-04-30 DIAGNOSIS — D509 Iron deficiency anemia, unspecified: Secondary | ICD-10-CM

## 2023-04-30 DIAGNOSIS — Z7984 Long term (current) use of oral hypoglycemic drugs: Secondary | ICD-10-CM

## 2023-04-30 DIAGNOSIS — E1165 Type 2 diabetes mellitus with hyperglycemia: Secondary | ICD-10-CM

## 2023-04-30 DIAGNOSIS — E2839 Other primary ovarian failure: Secondary | ICD-10-CM

## 2023-04-30 DIAGNOSIS — Z0000 Encounter for general adult medical examination without abnormal findings: Secondary | ICD-10-CM | POA: Diagnosis not present

## 2023-04-30 DIAGNOSIS — I10 Essential (primary) hypertension: Secondary | ICD-10-CM

## 2023-04-30 DIAGNOSIS — E785 Hyperlipidemia, unspecified: Secondary | ICD-10-CM | POA: Diagnosis not present

## 2023-04-30 LAB — COMPREHENSIVE METABOLIC PANEL
ALT: 10 U/L (ref 0–35)
AST: 13 U/L (ref 0–37)
Albumin: 4.2 g/dL (ref 3.5–5.2)
Alkaline Phosphatase: 62 U/L (ref 39–117)
BUN: 16 mg/dL (ref 6–23)
CO2: 31 meq/L (ref 19–32)
Calcium: 9.6 mg/dL (ref 8.4–10.5)
Chloride: 103 meq/L (ref 96–112)
Creatinine, Ser: 0.62 mg/dL (ref 0.40–1.20)
GFR: 98.89 mL/min (ref >60.00)
Glucose, Bld: 88 mg/dL (ref 70–99)
Potassium: 4 meq/L (ref 3.5–5.1)
Sodium: 140 meq/L (ref 135–145)
Total Bilirubin: 0.6 mg/dL (ref 0.2–1.2)
Total Protein: 7.2 g/dL (ref 6.0–8.3)

## 2023-04-30 LAB — MICROALBUMIN / CREATININE URINE RATIO
Creatinine,U: 100.4 mg/dL
Microalb Creat Ratio: 4.2 mg/g (ref 0.0–30.0)
Microalb, Ur: 4.2 mg/dL — ABNORMAL HIGH (ref 0.0–1.9)

## 2023-04-30 LAB — CBC WITH DIFFERENTIAL/PLATELET
Basophils Absolute: 0 10*3/uL (ref 0.0–0.1)
Basophils Relative: 0.6 % (ref 0.0–3.0)
Eosinophils Absolute: 0.2 10*3/uL (ref 0.0–0.7)
Eosinophils Relative: 4.4 % (ref 0.0–5.0)
HCT: 38.1 % (ref 36.0–46.0)
Hemoglobin: 12.3 g/dL (ref 12.0–15.0)
Lymphocytes Relative: 38.2 % (ref 12.0–46.0)
Lymphs Abs: 2.1 10*3/uL (ref 0.7–4.0)
MCHC: 32.2 g/dL (ref 30.0–36.0)
MCV: 94.5 fL (ref 78.0–100.0)
Monocytes Absolute: 0.3 10*3/uL (ref 0.1–1.0)
Monocytes Relative: 6.4 % (ref 3.0–12.0)
Neutro Abs: 2.7 10*3/uL (ref 1.4–7.7)
Neutrophils Relative %: 50.4 % (ref 43.0–77.0)
Platelets: 322 10*3/uL (ref 150.0–400.0)
RBC: 4.03 Mil/uL (ref 3.87–5.11)
RDW: 15.4 % (ref 11.5–15.5)
WBC: 5.4 10*3/uL (ref 4.0–10.5)

## 2023-04-30 LAB — LIPID PANEL
Cholesterol: 190 mg/dL (ref 0–200)
HDL: 47.8 mg/dL (ref >39.00)
LDL Cholesterol: 123 mg/dL — ABNORMAL HIGH (ref 0–99)
NonHDL: 142.42
Total CHOL/HDL Ratio: 4
Triglycerides: 96 mg/dL (ref 0.0–149.0)
VLDL: 19.2 mg/dL (ref 0.0–40.0)

## 2023-04-30 LAB — HEMOGLOBIN A1C: Hgb A1c MFr Bld: 5.5 % (ref 4.6–6.5)

## 2023-04-30 LAB — TSH: TSH: 1.98 u[IU]/mL (ref 0.35–5.50)

## 2023-04-30 NOTE — Progress Notes (Signed)
 Established Patient Office Visit  Subjective   Patient ID: Tiffany Gallagher, female    DOB: Jan 26, 1966  Age: 58 y.o. MRN: [id number]  Chief Complaint  Patient presents with   Annual Exam    Pt states fasting     HPI Discussed the use of AI scribe software for clinical note transcription with the patient, who gave verbal consent to proceed.  History of Present Illness   The patient, with a history of diabetes and hyperlipidemia, presents for a routine physical examination. She reports no new health issues since the last visit. The patient has been managing her diabetes with metformin  and a weekly injection, which she has not yet started due to high cost. She is exploring options for a generic substitute with her insurance.  The patient also takes omeprazole  for heartburn, but not daily, as her symptoms have improved since an endoscopy and polyp removal. She takes it as needed when symptoms flare up. She also takes metoprolol , prescribed by another doctor, and hydrochlorothiazide  and benazepril , both refilled in October for a year.  She reports no changes in family history or new surgeries. She continues to smoke cigarettes, with the frequency varying depending on work stress. She acknowledges the need to quit, especially given her diabetes.  The patient has been trying to exercise daily and has lost about four pounds since her last visit in November. She has been to the eye doctor within the last twelve months, but the office did not send the records. She has also had a mammogram in September and is due for a bone density test, which she plans to schedule with her next mammogram.      Patient Active Problem List   Diagnosis Date Noted   Type 2 diabetes mellitus with hyperglycemia, without long-term current use of insulin  (HCC) 10/20/2022   History of total vaginal hysterectomy (TVH) 07/04/2020   Healthcare maintenance 04/26/2020   It band syndrome, right 04/24/2020   Hyperlipidemia  09/19/2019   Mild intermittent asthma without complication 09/19/2019   Hair loss 09/19/2019   Dyspepsia 09/19/2019   Right hip pain 09/19/2019   Leg skin lesion, right 06/09/2017   OSA (obstructive sleep apnea) 06/05/2016   Preventative health care 02/09/2015   Acute bacterial bronchitis 11/15/2014   Severe obesity (BMI >= 40) (HCC) 05/10/2014   Physical exam 01/20/2014   HSV-1 infection 06/20/2013   Migraines    Smoker    Sinusitis 04/11/2011   Leg cramps 09/18/2010   Edema 09/18/2010   VAGINAL DISCHARGE 09/20/2009   KNEE PAIN, BILATERAL 09/20/2009   FLATULENCE ERUCTATION AND GAS PAIN 08/03/2009   HEMATURIA, HX OF 07/04/2009   Essential hypertension 02/07/2008   GERD 11/30/2006   Past Medical History:  Diagnosis Date   Allergy    Diabetes mellitus without complication (HCC)    Diverticulitis    Fibroid    Hyperlipidemia    Hypertension    Migraines    Obesity, morbid, BMI 40.0-49.9 (HCC)    Sleep apnea    cpap   Past Surgical History:  Procedure Laterality Date   bone spur     removed   CESAREAN SECTION  1995   VAGINAL HYSTERECTOMY  1998   partial   Social History   Tobacco Use   Smoking status: Every Day    Current packs/day: 1.00    Average packs/day: 0.5 packs/day for 32.0 years (16.0 ttl pk-yrs)    Types: Cigarettes    Start date: 2025   Smokeless tobacco:  Never   Tobacco comments:    0.5-1 ppd   04/30/2023  Vaping Use   Vaping status: Never Used  Substance Use Topics   Alcohol use: Yes    Comment: Occas wine   Drug use: Yes    Types: Marijuana    Comment: occ   Social History   Socioeconomic History   Marital status: Divorced    Spouse name: Not on file   Number of children: 1   Years of education: 17   Highest education level: Bachelor's degree (e.g., BA, AB, BS)  Occupational History   Occupation: customer service   Tobacco Use   Smoking status: Every Day    Current packs/day: 1.00    Average packs/day: 0.5 packs/day for 32.0 years  (16.0 ttl pk-yrs)    Types: Cigarettes    Start date: 2025   Smokeless tobacco: Never   Tobacco comments:    0.5-1 ppd   04/30/2023  Vaping Use   Vaping status: Never Used  Substance and Sexual Activity   Alcohol use: Yes    Comment: Occas wine   Drug use: Yes    Types: Marijuana    Comment: occ   Sexual activity: Not Currently    Partners: Male    Birth control/protection: Surgical, Condom  Other Topics Concern   Not on file  Social History Narrative   Exercise-- just starting to walk 4 -7 days a week , yoga    Social Drivers of Health   Financial Resource Strain: Low Risk  (01/16/2023)   Overall Financial Resource Strain (CARDIA)    Difficulty of Paying Living Expenses: Not hard at all  Food Insecurity: No Food Insecurity (01/16/2023)   Hunger Vital Sign    Worried About Running Out of Food in the Last Year: Never true    Ran Out of Food in the Last Year: Never true  Transportation Needs: No Transportation Needs (01/16/2023)   PRAPARE - Administrator, Civil Service (Medical): No    Lack of Transportation (Non-Medical): No  Physical Activity: Insufficiently Active (01/16/2023)   Exercise Vital Sign    Days of Exercise per Week: 4 days    Minutes of Exercise per Session: 30 min  Stress: No Stress Concern Present (01/16/2023)   [employer] of Occupational Health - Occupational Stress Questionnaire    Feeling of Stress : Not at all  Social Connections: Moderately Integrated (01/16/2023)   Social Connection and Isolation Panel [NHANES]    Frequency of Communication with Friends and Family: More than three times a week    Frequency of Social Gatherings with Friends and Family: Once a week    Attends Religious Services: More than 4 times per year    Active Member of [employer] or Organizations: Yes    Attends Engineer, Structural: More than 4 times per year    Marital Status: Divorced  Catering Manager Violence: Not on file   Family Status  Relation Name  Status   Mother  Deceased   Father  Deceased   Sister theresa Alive   Sister  Alive   Brother victor Alive   Brother willie Alive  No partnership data on file   Family History  Problem Relation Age of Onset   Coronary artery disease Mother    Hypertension Mother    Kidney disease Mother 1       dialysis   Aneurysm Mother        brain   Stroke Mother  Heart disease Mother        MI   Cancer Father 48       brain cancer   Diabetes Sister    Hyperlipidemia Sister    Hypertension Sister    Hypertension Sister    Arthritis Brother    Hypertension Brother    Diabetes Brother    Hypertension Brother    Heart attack Brother    Allergies  Allergen Reactions   Egg-Derived Products    Lactose Intolerance (Gi)    Milk-Related Compounds Other (See Comments)   Valtrex  [Valacyclovir ]     itching   Lipitor [Atorvastatin  Calcium ]     Nerves, muscle spasms, light stool      Review of Systems  Constitutional:  Negative for chills, fever and malaise/fatigue.  HENT:  Negative for congestion and hearing loss.   Eyes:  Negative for discharge.  Respiratory:  Negative for cough, sputum production and shortness of breath.   Cardiovascular:  Negative for chest pain, palpitations and leg swelling.  Gastrointestinal:  Negative for abdominal pain, blood in stool, constipation, diarrhea, heartburn, nausea and vomiting.  Genitourinary:  Negative for dysuria, frequency, hematuria and urgency.  Musculoskeletal:  Negative for back pain, falls and myalgias.  Skin:  Negative for rash.  Neurological:  Negative for dizziness, sensory change, loss of consciousness, weakness and headaches.  Endo/Heme/Allergies:  Negative for environmental allergies. Does not bruise/bleed easily.  Psychiatric/Behavioral:  Negative for depression and suicidal ideas. The patient is not nervous/anxious and does not have insomnia.       Objective:     BP 120/80 (BP Location: Left Arm, Patient Position: Sitting,  Cuff Size: Large)   Pulse 77   Temp 98.6 F (37 C) (Oral)   Resp 18   Ht 5' 4 (1.626 m)   Wt 262 lb 9.6 oz (119.1 kg)   SpO2 98%   BMI 45.08 kg/m  BP Readings from Last 3 Encounters:  04/30/23 120/80  04/03/23 (!) 140/84  04/02/23 128/83   Wt Readings from Last 3 Encounters:  04/30/23 262 lb 9.6 oz (119.1 kg)  04/03/23 261 lb 12.8 oz (118.8 kg)  03/12/23 266 lb (120.7 kg)   SpO2 Readings from Last 3 Encounters:  04/30/23 98%  04/03/23 98%  04/02/23 95%      Physical Exam Vitals and nursing note reviewed.  Constitutional:      General: She is not in acute distress.    Appearance: Normal appearance. She is well-developed.  HENT:     Head: Normocephalic and atraumatic.     Right Ear: Tympanic membrane, ear canal and external ear normal. There is no impacted cerumen.     Left Ear: Tympanic membrane, ear canal and external ear normal. There is no impacted cerumen.     Nose: Nose normal.     Mouth/Throat:     Mouth: Mucous membranes are moist.     Pharynx: Oropharynx is clear. No oropharyngeal exudate or posterior oropharyngeal erythema.  Eyes:     General: No scleral icterus.       Right eye: No discharge.        Left eye: No discharge.     Conjunctiva/sclera: Conjunctivae normal.     Pupils: Pupils are equal, round, and reactive to light.  Neck:     Thyroid : No thyromegaly or thyroid  tenderness.     Vascular: No JVD.  Cardiovascular:     Rate and Rhythm: Normal rate and regular rhythm.     Heart sounds: Normal heart  sounds. No murmur heard. Pulmonary:     Effort: Pulmonary effort is normal. No respiratory distress.     Breath sounds: Normal breath sounds.  Abdominal:     General: Bowel sounds are normal. There is no distension.     Palpations: Abdomen is soft. There is no mass.     Tenderness: There is no abdominal tenderness. There is no guarding or rebound.  Musculoskeletal:        General: Normal range of motion.     Cervical back: Normal range of motion  and neck supple.     Right lower leg: No edema.     Left lower leg: No edema.  Lymphadenopathy:     Cervical: No cervical adenopathy.  Skin:    General: Skin is warm and dry.     Findings: No erythema or rash.  Neurological:     Mental Status: She is alert and oriented to person, place, and time.     Cranial Nerves: No cranial nerve deficit.     Deep Tendon Reflexes: Reflexes are normal and symmetric.  Psychiatric:        Mood and Affect: Mood normal.        Behavior: Behavior normal.        Thought Content: Thought content normal.        Judgment: Judgment normal.     No results found for any visits on 04/30/23.  Last CBC Lab Results  Component Value Date   WBC 6.1 02/26/2023   HGB 11.3 (L) 02/26/2023   HCT 34.6 (L) 02/26/2023   MCV 94.0 02/26/2023   MCH 31.1 10/19/2022   RDW 15.4 02/26/2023   PLT 307.0 02/26/2023   Last metabolic panel Lab Results  Component Value Date   GLUCOSE 82 03/12/2023   NA 145 (H) 03/12/2023   K 3.9 03/12/2023   CL 106 03/12/2023   CO2 25 03/12/2023   BUN 13 03/12/2023   CREATININE 0.80 03/12/2023   EGFR 86 03/12/2023   CALCIUM  9.8 03/12/2023   PHOS 2.9 09/18/2010   PROT 7.8 02/23/2023   ALBUMIN 4.2 02/23/2023   BILITOT 0.4 02/23/2023   ALKPHOS 60 02/23/2023   AST 14 02/23/2023   ALT 13 02/23/2023   ANIONGAP 15 10/19/2022   Last lipids Lab Results  Component Value Date   CHOL 161 02/23/2023   HDL 43.90 02/23/2023   LDLCALC 99 02/23/2023   LDLDIRECT 172.0 10/20/2022   TRIG 90.0 02/23/2023   CHOLHDL 4 02/23/2023   Last hemoglobin A1c Lab Results  Component Value Date   HGBA1C 5.3 02/23/2023   Last thyroid  functions Lab Results  Component Value Date   TSH 2.70 04/28/2022   T4TOTAL 9.2 09/16/2019   Last vitamin D  Lab Results  Component Value Date   VD25OH 25.30 (L) 04/25/2021   Last vitamin B12 and Folate Lab Results  Component Value Date   VITAMINB12 174 (L) 09/16/2019   FOLATE 6.0 09/16/2019      The  10-year ASCVD risk score (Arnett DK, et al., 2019) is: 19.8%    Assessment & Plan:   Problem List Items Addressed This Visit       Unprioritized   Type 2 diabetes mellitus with hyperglycemia, without long-term current use of insulin  (HCC)   Relevant Orders   Comprehensive metabolic panel   Hemoglobin A1c   Microalbumin / creatinine urine ratio   Preventative health care - Primary   Ghm utd Check labs  See AVS Health Maintenance  Topic Date Due  OPHTHALMOLOGY EXAM  Never done   Zoster Vaccines- Shingrix (1 of 2) Never done   COVID-19 Vaccine (3 - Moderna risk series) 06/30/2020   INFLUENZA VACCINE  07/20/2023 (Originally 11/20/2022)   HEMOGLOBIN A1C  08/23/2023   FOOT EXAM  01/20/2024   Diabetic kidney evaluation - Urine ACR  02/23/2024   Diabetic kidney evaluation - eGFR measurement  03/11/2024   MAMMOGRAM  01/08/2025   Colonoscopy  05/19/2026   DTaP/Tdap/Td (3 - Td or Tdap) 03/15/2028   Hepatitis C Screening  Completed   HIV Screening  Completed   HPV VACCINES  Aged Out         Relevant Orders   Lipid panel   CBC with Differential/Platelet   Comprehensive metabolic panel   Hemoglobin A1c   Microalbumin / creatinine urine ratio   TSH   Hyperlipidemia   Encourage heart healthy diet such as MIND or DASH diet, increase exercise, avoid trans fats, simple carbohydrates and processed foods, consider a krill or fish or flaxseed oil cap daily.        Essential hypertension   Well controlled, no changes to meds. Encouraged heart healthy diet such as the DASH diet and exercise as tolerated.        Other Visit Diagnoses       Need for pneumococcal 20-valent conjugate vaccination       Relevant Orders   Pneumococcal conjugate vaccine 20-valent (Prevnar 20) (Completed)     Estrogen deficiency       Relevant Orders   DG Bone Density     Iron deficiency anemia, unspecified iron deficiency anemia type         Assessment and Plan    Type 2 Diabetes Mellitus   She is  under the care of an endocrinologist for Type 2 Diabetes Mellitus, currently taking metformin . Efforts to obtain Mounjaro  have been hindered by its cost. She has been losing weight and exercising, which is beneficial for diabetes management. We discussed the potential of switching to Ozempic as an alternative, acknowledging its different efficacy. The plan is to continue metformin , encourage further weight loss and exercise, and discuss alternative medications with her endocrinologist if Mounjaro  remains unaffordable.  Hypertension   Her hypertension is well-managed with hydrochlorothiazide  and benazepril , both of which were refilled in October for a year. Blood pressure control is satisfactory. She will continue taking hydrochlorothiazide  and benazepril  as prescribed.  Hyperlipidemia   Hyperlipidemia is managed with cholesterol medication, which was also refilled in October for a year, and she has reported no issues. She will continue the cholesterol medication as prescribed.  Gastroesophageal Reflux Disease (GERD)   GERD is managed with omeprazole  as needed. An endoscopy revealed a polyp that was removed, and her symptoms are currently well-controlled. She will continue using omeprazole  as needed.  Smoking   She is a social smoker, using both marijuana and cigarettes, and has increased smoking while working from home. She is aware of the increased risks due to diabetes. The plan is to discuss smoking cessation programs and encourage a reduction in smoking.  General Health Maintenance   She is up to date with eye exams and mammograms but is due for a bone density scan. She is eligible for shingles and pneumonia vaccines. We discussed the risks and benefits of the shingles vaccine, including potential side effects like a sore arm and flu-like symptoms. The pneumonia vaccine is recommended due to diabetes and being over 50. Today, we will administer the pneumonia vaccine, schedule a bone  density scan,  recommend the shingles vaccine with instructions to schedule a nurse visit or visit a pharmacy for administration, and encourage regular dental visits.  Follow-up   A follow-up appointment is scheduled for six months from now.        No follow-ups on file.    Raynette Arras R Lowne Chase, DO

## 2023-04-30 NOTE — Assessment & Plan Note (Signed)
 Ghm utd Check labs  See AVS Health Maintenance  Topic Date Due   OPHTHALMOLOGY EXAM  Never done   Zoster Vaccines- Shingrix (1 of 2) Never done   COVID-19 Vaccine (3 - Moderna risk series) 06/30/2020   INFLUENZA VACCINE  07/20/2023 (Originally 11/20/2022)   HEMOGLOBIN A1C  08/23/2023   FOOT EXAM  01/20/2024   Diabetic kidney evaluation - Urine ACR  02/23/2024   Diabetic kidney evaluation - eGFR measurement  03/11/2024   MAMMOGRAM  01/08/2025   Colonoscopy  05/19/2026   DTaP/Tdap/Td (3 - Td or Tdap) 03/15/2028   Hepatitis C Screening  Completed   HIV Screening  Completed   HPV VACCINES  Aged Out

## 2023-04-30 NOTE — Assessment & Plan Note (Signed)
 Encourage heart healthy diet such as MIND or DASH diet, increase exercise, avoid trans fats, simple carbohydrates and processed foods, consider a krill or fish or flaxseed oil cap daily.

## 2023-04-30 NOTE — Assessment & Plan Note (Signed)
 Well controlled, no changes to meds. Encouraged heart healthy diet such as the DASH diet and exercise as tolerated.

## 2023-05-03 ENCOUNTER — Encounter (HOSPITAL_COMMUNITY): Payer: Self-pay

## 2023-05-03 ENCOUNTER — Ambulatory Visit (HOSPITAL_COMMUNITY)
Admission: EM | Admit: 2023-05-03 | Discharge: 2023-05-03 | Disposition: A | Discharge status: Home / Self Care | Payer: BC Managed Care – PPO | Attending: Emergency Medicine | Admitting: Emergency Medicine

## 2023-05-03 DIAGNOSIS — T7840XA Allergy, unspecified, initial encounter: Secondary | ICD-10-CM

## 2023-05-03 DIAGNOSIS — L299 Pruritus, unspecified: Secondary | ICD-10-CM

## 2023-05-03 MED ORDER — DIPHENHYDRAMINE HCL 25 MG PO CAPS
ORAL_CAPSULE | ORAL | Status: AC
  Filled 2023-05-03: qty 1

## 2023-05-03 MED ORDER — FAMOTIDINE 20 MG PO TABS
20.0000 mg | ORAL_TABLET | Freq: Once | ORAL | Status: AC
  Administered 2023-05-03: 20 mg via ORAL

## 2023-05-03 MED ORDER — DIPHENHYDRAMINE HCL 25 MG PO CAPS
25.0000 mg | ORAL_CAPSULE | Freq: Once | ORAL | Status: AC
  Administered 2023-05-03: 25 mg via ORAL

## 2023-05-03 MED ORDER — METHYLPREDNISOLONE SODIUM SUCC 125 MG IJ SOLR
60.0000 mg | Freq: Once | INTRAMUSCULAR | Status: AC
  Administered 2023-05-03: 60 mg via INTRAMUSCULAR

## 2023-05-03 MED ORDER — FAMOTIDINE 20 MG PO TABS
ORAL_TABLET | ORAL | Status: AC
  Filled 2023-05-03: qty 1

## 2023-05-03 MED ORDER — METHYLPREDNISOLONE SODIUM SUCC 125 MG IJ SOLR
INTRAMUSCULAR | Status: AC
  Filled 2023-05-03: qty 2

## 2023-05-03 NOTE — ED Triage Notes (Signed)
 Patient reports that she had a pneumonia shot 3 days ago and states she felt fine the next day. Yesterday patient states she had slight swelling with a rash to her hands and rash on the left arm. Today the patient is having hives on her abdomen and back and states she feels slight SOB and a headache.  Patient states she has slight swelling at the injection site on her left deltoid area.  Patient states she used hydrocortisone cream for itching to the rash/hives.

## 2023-05-03 NOTE — Discharge Instructions (Addendum)
 The steroid injection given today should start to work in about 30 minutes. It will help with itching, swelling, and rash. Please note it may raise your blood sugars for the next few days!  I have given you a dose of benadryl  and pepcid  today as well.  Starting tomorrow morning, please use this anti-histamine combination for the next several days (4-5). They will work to keep itching and rash down.  Zyrtec - 1 tablet (10 mg) every morning Pepcid  - 1 tablet (20 mg) every night Benadryl  - 1 tablet (25 mg) every night   Please return with any concerns. Go to the emergency department if you feel trouble breathing or swallowing.

## 2023-05-03 NOTE — ED Provider Notes (Signed)
 [facility]    CSN: [id number] Arrival date & time: 05/03/23  1034     History   Chief Complaint Chief Complaint  Patient presents with   Allergic Reaction    HPI Tiffany Gallagher is a 58 y.o. female.  3 days ago received pneumococcal vaccine in left deltoid  The next day having rash and itching on her hands and arms. Today feels itching on neck, chest, abdomen. No rash noticed At first concerned for shortness of breath. However she is taking normal breaths without pain or laboring, denies wheezing. Reports she is a smoker. No abdominal pain, nausea or vomiting  Also reported swelling and tenderness at injection site Slight headache No medicines taken yet  Past Medical History:  Diagnosis Date   Allergy    Diabetes mellitus without complication (HCC)    Diverticulitis    Fibroid    Hyperlipidemia    Hypertension    Migraines    Obesity, morbid, BMI 40.0-49.9 (HCC)    Sleep apnea    cpap    Patient Active Problem List   Diagnosis Date Noted   Type 2 diabetes mellitus with hyperglycemia, without long-term current use of insulin  (HCC) 10/20/2022   History of total vaginal hysterectomy (TVH) 07/04/2020   Healthcare maintenance 04/26/2020   It band syndrome, right 04/24/2020   Hyperlipidemia 09/19/2019   Mild intermittent asthma without complication 09/19/2019   Hair loss 09/19/2019   Dyspepsia 09/19/2019   Right hip pain 09/19/2019   Leg skin lesion, right 06/09/2017   OSA (obstructive sleep apnea) 06/05/2016   Preventative health care 02/09/2015   Acute bacterial bronchitis 11/15/2014   Severe obesity (BMI >= 40) (HCC) 05/10/2014   Physical exam 01/20/2014   HSV-1 infection 06/20/2013   Migraines    Smoker    Sinusitis 04/11/2011   Leg cramps 09/18/2010   Edema 09/18/2010   VAGINAL DISCHARGE 09/20/2009   KNEE PAIN, BILATERAL 09/20/2009   FLATULENCE ERUCTATION AND GAS PAIN 08/03/2009   HEMATURIA, HX OF 07/04/2009   Essential hypertension  02/07/2008   GERD 11/30/2006    Past Surgical History:  Procedure Laterality Date   bone spur     removed   CESAREAN SECTION  1995   VAGINAL HYSTERECTOMY  1998   partial    OB History     Gravida  2   Para  1   Term  1   Preterm      AB  1   Living  1      SAB      IAB      Ectopic      Multiple      Live Births               Home Medications    Prior to Admission medications   Medication Sig Start Date End Date Taking? Authorizing Provider  Accu-Chek Softclix Lancets lancets 1 EACH BY DOES NOT APPLY ROUTE IN THE MORNING, AT NOON, AND AT BEDTIME 04/01/23   Antonio Meth, Yvonne R, DO  acyclovir  ointment (ZOVIRAX ) 5 % Apply three times per day as needed 07/14/22   Prentiss Annabella LABOR, NP  albuterol  (VENTOLIN  HFA) 108 (90 Base) MCG/ACT inhaler Inhale 2 puffs into the lungs every 4 (four) hours as needed for wheezing or shortness of breath. 04/02/23   Banister, Pamela K, MD  Ascorbic Acid (VITAMIN C) 100 MG tablet Take 100 mg by mouth daily.    [provider]  benazepril  (LOTENSIN ) 20 MG  tablet Take 1 tablet (20 mg total) by mouth daily. 01/20/23   Lowne Chase, Yvonne R, DO  Blood Glucose Monitoring Suppl DEVI 1 each by Does not apply route in the morning, at noon, and at bedtime. May substitute to any manufacturer covered by patient's insurance. 10/20/22   Lowne Chase, Yvonne R, DO  CALCIUM  PO Take 1 tablet by mouth daily.    [provider]  Glucose Blood (BLOOD GLUCOSE TEST STRIPS) STRP Check blood sugars 3 times daily May substitute to any manufacturer covered by patient's insurance. 04/13/23   Antonio Cyndee Jamee JONELLE, DO  hydrochlorothiazide  (HYDRODIURIL ) 25 MG tablet Take 1 tablet (25 mg total) by mouth daily. 01/20/23   Antonio Cyndee Jamee JONELLE, DO  insulin  aspart (NOVOLOG  FLEXPEN) 100 UNIT/ML FlexPen Per sliding scale ---  no more than 40u/ day Patient taking differently: Inject 5 Units into the skin 3 (three) times daily with meals. Per sliding  scale ---  no more than 40u/ day 10/20/22   Antonio Cyndee, Jamee JONELLE, DO  insulin  glargine (LANTUS  SOLOSTAR) 100 UNIT/ML Solostar Pen Inject 15 Units into the skin daily. Use sliding scale provided to Pt Patient taking differently: Inject 12 Units into the skin daily. Use sliding scale provided to Pt 10/28/22   Antonio Cyndee, Jamee JONELLE, DO  Insulin  Pen Needle (BD PEN NEEDLE NANO 2ND GEN) 32G X 4 MM MISC To use w/ Lantus  02/16/23   Antonio Cyndee, Yvonne R, DO  metFORMIN  (GLUCOPHAGE -XR) 500 MG 24 hr tablet Take 2 tablets (1,000 mg total) by mouth 2 (two) times daily with a meal. 02/27/23 04/03/23  Dartha Ernst, MD  Multiple Vitamin (MULTIVITAMIN WITH MINERALS) TABS Take 1 tablet by mouth daily.    [provider]  omeprazole  (PRILOSEC) 20 MG capsule Take 1 capsule (20 mg total) by mouth daily. 04/28/22 09/04/24  Antonio Cyndee Jamee R, DO  OVER THE COUNTER MEDICATION Take by mouth daily. [employer] multivitamin    [provider]  OVER THE COUNTER MEDICATION Take by mouth daily. [employer] joint supplement    [provider]  Pitavastatin  Calcium  2 MG TABS Take 1 tablet (2 mg total) by mouth daily. 01/20/23   Lowne Chase, Yvonne R, DO  thiamine (VITAMIN B-1) 100 MG tablet Take 100 mg by mouth daily.    [provider]  tirzepatide  (MOUNJARO ) 2.5 MG/0.5ML Pen Inject 2.5 mg into the skin once a week. 04/03/23   Motwani, Ernst, MD  triamcinolone  cream (KENALOG) 0.1 % Apply 1 Application topically 2 (two) times daily. 02/09/23   [provider]  VITAMIN D  PO Take 4,000 Units by mouth daily.    [provider]    Family History Family History  Problem Relation Age of Onset   Coronary artery disease Mother    Hypertension Mother    Kidney disease Mother 35       dialysis   Aneurysm Mother        brain   Stroke Mother    Heart disease Mother        MI   Cancer Father 76       brain cancer   Diabetes Sister    Hyperlipidemia Sister    Hypertension Sister     Hypertension Sister    Arthritis Brother    Hypertension Brother    Diabetes Brother    Hypertension Brother    Heart attack Brother     Social History Social History   Tobacco Use   Smoking status: Every Day  Current packs/day: 1.00    Average packs/day: 0.5 packs/day for 32.0 years (16.0 ttl pk-yrs)    Types: Cigarettes    Start date: 2025   Smokeless tobacco: Never   Tobacco comments:    0.5-1 ppd   04/30/2023  Vaping Use   Vaping status: Never Used  Substance Use Topics   Alcohol use: Yes    Comment: Occas wine   Drug use: Yes    Types: Marijuana    Comment: occ     Allergies   Egg-derived products, Lactose intolerance (gi), Milk-related compounds, Valtrex  [valacyclovir ], and Lipitor [atorvastatin  calcium ]   Review of Systems Review of Systems Per HPI  Physical Exam Triage Vital Signs ED Triage Vitals  Encounter Vitals Group     BP 05/03/23 1103 136/89     Systolic BP Percentile --      Diastolic BP Percentile --      Pulse Rate 05/03/23 1103 80     Resp 05/03/23 1103 18     Temp 05/03/23 1103 98.1 F (36.7 C)     Temp Source 05/03/23 1103 Oral     SpO2 05/03/23 1103 95 %     Weight --      Height --      Head Circumference --      Peak Flow --      Pain Score 05/03/23 1102 9     Pain Loc --      Pain Education --      Exclude from Growth Chart --    No data found.  Updated Vital Signs BP 136/89 (BP Location: Right Arm)   Pulse 80   Temp 98.1 F (36.7 C) (Oral)   Resp 18   SpO2 98%   Physical Exam Vitals and nursing note reviewed.  Constitutional:      General: She is not in acute distress.    Appearance: Normal appearance. She is not ill-appearing or diaphoretic.  HENT:     Mouth/Throat:     Mouth: Mucous membranes are moist.     Pharynx: Oropharynx is clear. No posterior oropharyngeal erythema.     Comments: No swelling of face, lips, tongue. Speaks in full sentences, tolerating secretions Eyes:     Conjunctiva/sclera:  Conjunctivae normal.     Pupils: Pupils are equal, round, and reactive to light.  Cardiovascular:     Rate and Rhythm: Normal rate and regular rhythm.     Pulses: Normal pulses.     Heart sounds: Normal heart sounds.  Pulmonary:     Effort: Pulmonary effort is normal. No respiratory distress.     Breath sounds: Normal breath sounds. No wheezing.     Comments: Clear sounds throughout. No respiratory distress. Even and unlabored breaths Abdominal:     Palpations: Abdomen is soft.     Tenderness: There is no abdominal tenderness.  Musculoskeletal:        General: Normal range of motion.     Cervical back: Normal range of motion.  Skin:    Findings: No rash.     Comments: No rash noted on exam. Patient itching hands and arms. There is no swelling noted at injection site, but deltoid is tender   Neurological:     General: No focal deficit present.     Mental Status: She is alert and oriented to person, place, and time.     Cranial Nerves: No cranial nerve deficit.     Motor: No weakness.     UC Treatments /  Results  Labs (all labs ordered are listed, but only abnormal results are displayed) Labs Reviewed - No data to display  EKG  Radiology No results found.  Procedures Procedures (including critical care time)  Medications Ordered in UC Medications  methylPREDNISolone  sodium succinate (SOLU-MEDROL ) 125 mg/2 mL injection 60 mg (60 mg Intramuscular Given 05/03/23 1159)  famotidine  (PEPCID ) tablet 20 mg (20 mg Oral Given 05/03/23 1158)  diphenhydrAMINE  (BENADRYL ) capsule 25 mg (25 mg Oral Given 05/03/23 1158)    Initial Impression / Assessment and Plan / UC Course  I have reviewed the triage vital signs and the nursing notes.  Pertinent labs & imaging results that were available during my care of the patient were reviewed by me and considered in my medical decision making (see chart for details).    Patient well appearing, stable vitals, clear lungs and normal work of  breathing. No red flags at this time. No concerns for anaphylaxis. Suspect allergic reaction causing itch. Hx DM but well controlled. Fasting glucose this morning low 100s. A1c this month 5.5. IM steroid given with oral pepcid  and benadryl . Discussed other symptoms (injection site pain, headache) are side effects of vaccine. Treat itch at home with antihistamine regimen. Advised return and ED precautions. Patient agrees to plan, all questions answered  Final Clinical Impressions(s) / UC Diagnoses   Final diagnoses:  Allergic reaction, initial encounter  Pruritus     Discharge Instructions      The steroid injection given today should start to work in about 30 minutes. It will help with itching, swelling, and rash. Please note it may raise your blood sugars for the next few days!  I have given you a dose of benadryl  and pepcid  today as well.  Starting tomorrow morning, please use this anti-histamine combination for the next several days (4-5). They will work to keep itching and rash down.  Zyrtec - 1 tablet (10 mg) every morning Pepcid  - 1 tablet (20 mg) every night Benadryl  - 1 tablet (25 mg) every night   Please return with any concerns. Go to the emergency department if you feel trouble breathing or swallowing.     ED Prescriptions   None    PDMP not reviewed this encounter.   Yulianna Folse, PA-C 05/03/23 1256

## 2023-05-04 ENCOUNTER — Telehealth: Payer: Self-pay

## 2023-05-04 NOTE — Telephone Encounter (Signed)
 Pt seen at [facility].

## 2023-05-04 NOTE — Telephone Encounter (Signed)
 Initial Comment Caller states she took a Pneumonia shot. Now itching, bumps on arms and torso. Site is raised/ swollen, tender and sore. More tired than usual. Additional Comment An Exhaustive Search has been conducted Translation No Nurse Assessment Nurse: Juanito, RN, Olam Date/Time (Eastern Time): 05/02/2023 3:39:46 PM Confirm and document reason for call. If symptomatic, describe symptoms. ---Caller states she took a Pneumonia shot Thursday. Now itching, hives on arms and hand and injection site is swollen and sore. Caller has not medicated. Caller is diabetic. Denies fever, does complain of fatigue. Hives are by bend in arm on left, on right hand. Hives are nickel sized and looks like they have come down some. Does complain of itching. Denies any other medical symptoms at this time. Does the patient have any new or worsening symptoms? ---Yes Will a triage be completed? ---Yes Related visit to physician within the last 2 weeks? ---No Does the PT have any chronic conditions? (i.e. diabetes, asthma, this includes High risk factors for pregnancy, etc.) ---Yes List chronic conditions. ---Diabetic, HTN, Cholesterol, Is this a behavioral health or substance abuse call? ---No Guidelines Guideline Title Affirmed Question Affirmed Notes Nurse Date/Time (Eastern Time) Immunization Reactions Injection site reaction to any vaccine Leos, RN, Olam 05/02/2023 3:45:11 PM PLEASE NOTE: All timestamps contained within this report are represented as Eastern Standard Time. CONFIDENTIALTY NOTICE: This fax transmission is intended only for the addressee. It contains information that is legally privileged, confidential or otherwise protected from use or disclosure. If you are not the intended recipient, you are strictly prohibited from reviewing, disclosing, copying using or disseminating any of this information or taking any action in reliance on or regarding this information. If you have received  this fax in error, please notify us  immediately by telephone so that we can arrange for its return to us . Phone: [phone], Toll-Free: [phone], Fax: [phone] Page: 2 of 2 Call Id: [id number] Disp. Time Titus Time) Disposition Final User 05/02/2023 3:49:28 PM Home Care Yes Juanito, RN, Olam Final Disposition 05/02/2023 3:49:28 PM Home Care Yes Leos, RN, Olam [place] Disagree/Comply Comply Caller Understands Yes PreDisposition Call Pharmacist Care Advice Given Per Guideline HOME CARE: * You should be able to treat this at home. REASSURANCE AND EDUCATION - NORMAL REACTIONS: * Vaccines protect us  against serious diseases. HEAT PACK FOR LOCAL REACTION AT VACCINE SITE: * Put a heat pack or warm wet washcloth on the vaccine shot area for 10 to 20 minutes. * Repeat as needed for the first 48 hours after the injection. * ACETAMINOPHEN  - EXTRA STRENGTH TYLENOL : Take 1,000 mg (two 500 mg pills) every 6 to 8 hours as needed. Each Extra Strength Tylenol  pill has 500 mg of acetaminophen . The most you should take is 6 pills a day (3,000 mg total). Note: In [place], the maximum is 8 pills a day (4,000 mg total). CALL BACK IF: * Fever lasts over 3 days * Pain lasts over 3 days * Redness or swelling lasts over 3 days * You become worse CARE ADVICE given per Immunization Reactions (Adult) guideline

## 2023-05-04 NOTE — Telephone Encounter (Signed)
 Initial Comment Has rash in spots all over body Translation No Nurse Assessment Nurse: Debby, RN, Jacquline Date/Time Titus Time): 05/03/2023 8:14:57 AM Confirm and document reason for call. If symptomatic, describe symptoms. ---Caller states they had a pneumonia vaccine on Thursday and then started having a hives the next day near the injection site. It started around the site and is now all over her body. She is also having sharp pains in her abdomen and her leg. She tried benadryl  yesterday and this has not helped. Does the patient have any new or worsening symptoms? ---Yes Will a triage be completed? ---Yes Related visit to physician within the last 2 weeks? ---No Does the PT have any chronic conditions? (i.e. diabetes, asthma, this includes High risk factors for pregnancy, etc.) ---Yes List chronic conditions. ---HTN, DM, HLD Is this a behavioral health or substance abuse call? ---No Guidelines Guideline Title Affirmed Question Affirmed Notes Nurse Date/Time (Eastern Time) Rash or Redness - Widespread [1] Headache AND [2] no fever Debby OBIE Jacquline 05/03/2023 8:18:22 AM Disp. Time Titus Time) Disposition Final User 05/03/2023 8:25:42 AM See HCP within 4 Hours (or PCP triage) Yes Debby, RN, Jacquline PLEASE NOTE: All timestamps contained within this report are represented as Eastern Standard Time. CONFIDENTIALTY NOTICE: This fax transmission is intended only for the addressee. It contains information that is legally privileged, confidential or otherwise protected from use or disclosure. If you are not the intended recipient, you are strictly prohibited from reviewing, disclosing, copying using or disseminating any of this information or taking any action in reliance on or regarding this information. If you have received this fax in error, please notify us  immediately by telephone so that we can arrange for its return to us . Phone: [phone], Toll-Free:  [phone], Fax: [phone] Page: 2 of 2 Call Id: [id number] Final Disposition 05/03/2023 8:25:42 AM See HCP within 4 Hours (or PCP triage) Yes Debby, RN, Jacquline [place] Disagree/Comply Comply Caller Understands Yes PreDisposition Did not know what to do Care Advice Given Per Guideline SEE HCP (OR PCP TRIAGE) WITHIN 4 HOURS: CARE ADVICE given per Rash or Redness - Widespread (Adult) guideline. CALL BACK IF: * You become worse * UCC: Some UCCs can manage patients who are stable and have less serious symptoms (e.g., minor illnesses and injuries). The triager must know the [facility] capabilities before sending a patient there. If unsure, call ahead. Comments User: Jacquline Debby, RN Date/Time Titus Time): 05/03/2023 8:25:36 AM Caller later states that she was recommended to take benadryl  by triage RN yesterday but has not yet taken any. She states she might try some now to see if it helps the hives and if so, she would not go to UC. Advised to still be seen at [facility] if she is able to and to take a picture of the rash before taking benadryl  for reference for the provider. Voiced understanding. Referrals [facility] [place] - UC

## 2023-05-08 ENCOUNTER — Ambulatory Visit: Payer: BC Managed Care – PPO | Admitting: "Endocrinology

## 2023-05-10 ENCOUNTER — Encounter: Payer: Self-pay | Admitting: Family Medicine

## 2023-05-30 ENCOUNTER — Other Ambulatory Visit: Payer: Self-pay | Admitting: "Endocrinology

## 2023-05-30 DIAGNOSIS — G4733 Obstructive sleep apnea (adult) (pediatric): Secondary | ICD-10-CM | POA: Diagnosis not present

## 2023-06-27 DIAGNOSIS — G4733 Obstructive sleep apnea (adult) (pediatric): Secondary | ICD-10-CM | POA: Diagnosis not present

## 2023-07-06 ENCOUNTER — Other Ambulatory Visit: Payer: Self-pay

## 2023-07-06 ENCOUNTER — Other Ambulatory Visit: Payer: Self-pay | Admitting: Family Medicine

## 2023-07-06 ENCOUNTER — Ambulatory Visit (HOSPITAL_COMMUNITY)
Admission: EM | Admit: 2023-07-06 | Discharge: 2023-07-06 | Disposition: A | Discharge status: Home / Self Care | Attending: Nurse Practitioner | Admitting: Nurse Practitioner

## 2023-07-06 ENCOUNTER — Encounter (HOSPITAL_COMMUNITY): Payer: Self-pay | Admitting: Emergency Medicine

## 2023-07-06 DIAGNOSIS — R319 Hematuria, unspecified: Secondary | ICD-10-CM | POA: Diagnosis not present

## 2023-07-06 DIAGNOSIS — E1165 Type 2 diabetes mellitus with hyperglycemia: Secondary | ICD-10-CM

## 2023-07-06 DIAGNOSIS — R109 Unspecified abdominal pain: Secondary | ICD-10-CM

## 2023-07-06 DIAGNOSIS — E785 Hyperlipidemia, unspecified: Secondary | ICD-10-CM

## 2023-07-06 LAB — POCT URINALYSIS DIP (MANUAL ENTRY)
Bilirubin, UA: NEGATIVE
Glucose, UA: NEGATIVE mg/dL
Ketones, POC UA: NEGATIVE mg/dL
Leukocytes, UA: NEGATIVE
Nitrite, UA: NEGATIVE
Protein Ur, POC: NEGATIVE mg/dL
Spec Grav, UA: 1.01 (ref 1.010–1.025)
Urobilinogen, UA: 0.2 U/dL
pH, UA: 5 (ref 5.0–8.0)

## 2023-07-06 MED ORDER — TAMSULOSIN HCL 0.4 MG PO CAPS: 0.4000 mg | ORAL_CAPSULE | Freq: Every evening | ORAL | 0 refills | Status: DC | PRN

## 2023-07-06 NOTE — Discharge Instructions (Signed)
 As we discussed, I am concerned you have a kidney stone.  Start taking the Flomax as prescribed to help relax your lower urinary muscles.  Take this at night time.  Continue drinking plenty of water.  You can take Tylenol [phone] mg every 6 hours for pain as needed.  If you develop severe abdominal pain, severe back pain, nausea/vomiting and are unable to keep fluids down, please seek care.

## 2023-07-06 NOTE — ED Triage Notes (Signed)
 Back pain since last week.  Yesterday noticed pain with urination.  Today saw blood with wiping after urinating.

## 2023-07-06 NOTE — ED Notes (Signed)
 Patient is unable to provide a urine currently, given water to drink

## 2023-07-07 LAB — URINE CULTURE: Culture: NO GROWTH

## 2023-07-07 NOTE — ED Provider Notes (Signed)
 [facility]    CSN: [id number] Arrival date & time: 07/06/23  1806      History   Chief Complaint Chief Complaint  Patient presents with   Back Pain    HPI Tiffany Gallagher is a 58 y.o. female.   Patient presents today with 1 week history of bilateral flank pain that seem to improve a few days ago, then began with burning with urination starting last night and blood in the urine that began today.  She denies significant urinary frequency or urgency if she drinks a lot of water at baseline.  No suprapubic pain or pressure, fever, nausea/vomiting.  She is eating and drinking normally per her report.  Denies history of kidney stones or family history of kidney stones.  Has not taken anything for symptoms so far.    Past Medical History:  Diagnosis Date   Allergy    Diabetes mellitus without complication (HCC)    Diverticulitis    Fibroid    Hyperlipidemia    Hypertension    Migraines    Obesity, morbid, BMI 40.0-49.9 (HCC)    Sleep apnea    cpap    Patient Active Problem List   Diagnosis Date Noted   Type 2 diabetes mellitus with hyperglycemia, without long-term current use of insulin (HCC) 10/20/2022   History of total vaginal hysterectomy (TVH) 07/04/2020   Healthcare maintenance 04/26/2020   It band syndrome, right 04/24/2020   Hyperlipidemia 09/19/2019   Mild intermittent asthma without complication 09/19/2019   Hair loss 09/19/2019   Dyspepsia 09/19/2019   Right hip pain 09/19/2019   Leg skin lesion, right 06/09/2017   OSA (obstructive sleep apnea) 06/05/2016   Preventative health care 02/09/2015   Acute bacterial bronchitis 11/15/2014   Severe obesity (BMI >= 40) (HCC) 05/10/2014   Physical exam 01/20/2014   HSV-1 infection 06/20/2013   Migraines    Smoker    Sinusitis 04/11/2011   Leg cramps 09/18/2010   Edema 09/18/2010   VAGINAL DISCHARGE 09/20/2009   KNEE PAIN, BILATERAL 09/20/2009   FLATULENCE ERUCTATION AND GAS PAIN  08/03/2009   HEMATURIA, HX OF 07/04/2009   Essential hypertension 02/07/2008   GERD 11/30/2006    Past Surgical History:  Procedure Laterality Date   bone spur     removed   CESAREAN SECTION  1995   VAGINAL HYSTERECTOMY  1998   partial    OB History     Gravida  2   Para  1   Term  1   Preterm      AB  1   Living  1      SAB      IAB      Ectopic      Multiple      Live Births               Home Medications    Prior to Admission medications   Medication Sig Start Date End Date Taking? Authorizing Provider  metFORMIN (GLUCOPHAGE) 1000 MG tablet Take 1,000 mg by mouth 2 (two) times daily. 04/17/23  Yes [provider]  tamsulosin (FLOMAX) 0.4 MG CAPS capsule Take 1 capsule (0.4 mg total) by mouth at bedtime as needed. 07/06/23  Yes Valentino Nose, NP  Accu-Chek Softclix Lancets lancets 1 EACH BY DOES NOT APPLY ROUTE IN THE MORNING, AT NOON, AND AT BEDTIME 04/01/23   Zola Button, Grayling Congress, DO  acyclovir ointment (ZOVIRAX) 5 % Apply three times per day  as needed 07/14/22   Olivia Mackie, NP  albuterol (VENTOLIN HFA) 108 (90 Base) MCG/ACT inhaler Inhale 2 puffs into the lungs every 4 (four) hours as needed for wheezing or shortness of breath. 04/02/23   Zenia Resides, MD  Ascorbic Acid (VITAMIN C) 100 MG tablet Take 100 mg by mouth daily.    [provider]  BD PEN NEEDLE NANO 2ND GEN 32G X 4 MM MISC TO USE W/ LANTUS 07/07/23   Zola Button, Yvonne R, DO  benazepril (LOTENSIN) 20 MG tablet Take 1 tablet (20 mg total) by mouth daily. 01/20/23   Donato Schultz, DO  Blood Glucose Monitoring Suppl DEVI 1 each by Does not apply route in the morning, at noon, and at bedtime. May substitute to any manufacturer covered by patient's insurance. 10/20/22   Seabron Spates R, DO  CALCIUM PO Take 1 tablet by mouth daily.    [provider]  Glucose Blood (BLOOD GLUCOSE TEST STRIPS) STRP Check blood sugars 3 times daily May  substitute to any manufacturer covered by patient's insurance. 04/13/23   Donato Schultz, DO  hydrochlorothiazide (HYDRODIURIL) 25 MG tablet Take 1 tablet (25 mg total) by mouth daily. 01/20/23   Seabron Spates R, DO  insulin aspart (NOVOLOG FLEXPEN) 100 UNIT/ML FlexPen Per sliding scale ---  no more than 40u/ day Patient taking differently: Inject 5 Units into the skin 3 (three) times daily with meals. Per sliding scale ---  no more than 40u/ day 10/20/22   Zola Button, Grayling Congress, DO  insulin glargine (LANTUS SOLOSTAR) 100 UNIT/ML Solostar Pen Inject 15 Units into the skin daily. Use sliding scale provided to Pt Patient taking differently: Inject 12 Units into the skin daily. Use sliding scale provided to Pt 10/28/22   Zola Button, Myrene Buddy R, DO  metFORMIN (GLUCOPHAGE-XR) 500 MG 24 hr tablet TAKE 2 TABLETS (1,000 MG TOTAL) BY MOUTH 2 (TWO) TIMES DAILY WITH A MEAL. 06/01/23 07/01/23  Altamese Hatfield, MD  Multiple Vitamin (MULTIVITAMIN WITH MINERALS) TABS Take 1 tablet by mouth daily.    [provider]  omeprazole (PRILOSEC) 20 MG capsule Take 1 capsule (20 mg total) by mouth daily. 04/28/22 09/04/24  Seabron Spates R, DO  OVER THE COUNTER MEDICATION Take by mouth daily. [employer]way multivitamin    [provider]  OVER THE COUNTER MEDICATION Take by mouth daily. [employer] joint supplement    [provider]  Pitavastatin Calcium 2 MG TABS Take 1 tablet (2 mg total) by mouth daily. 01/20/23   Donato Schultz, DO  thiamine (VITAMIN B-1) 100 MG tablet Take 100 mg by mouth daily.    [provider]  tirzepatide Greggory Keen) 2.5 MG/0.5ML Pen Inject 2.5 mg into the skin once a week. Patient taking differently: Inject 2.5 mg into the skin once a week. HAS NOT STARTED THIS MEDICINE YET 04/03/23   Altamese Shelley, MD  triamcinolone cream (KENALOG) 0.1 % Apply 1 Application topically 2 (two) times daily. 02/09/23   [provider]  VITAMIN D PO Take 4,000 Units by  mouth daily.    [provider]    Family History Family History  Problem Relation Age of Onset   Coronary artery disease Mother    Hypertension Mother    Kidney disease Mother 82       dialysis   Aneurysm Mother        brain   Stroke Mother    Heart disease Mother  MI   Cancer Father 31       brain cancer   Diabetes Sister    Hyperlipidemia Sister    Hypertension Sister    Hypertension Sister    Arthritis Brother    Hypertension Brother    Diabetes Brother    Hypertension Brother    Heart attack Brother     Social History Social History   Tobacco Use   Smoking status: Every Day    Current packs/day: 1.00    Average packs/day: 0.5 packs/day for 32.2 years (16.2 ttl pk-yrs)    Types: Cigarettes    Start date: 2025   Smokeless tobacco: Never   Tobacco comments:    0.5-1 ppd   04/30/2023  Vaping Use   Vaping status: Never Used  Substance Use Topics   Alcohol use: Yes    Comment: Occas wine   Drug use: Yes    Types: Marijuana    Comment: occ     Allergies   Egg-derived products, Lactose intolerance (gi), Milk-related compounds, Valtrex [valacyclovir], and Lipitor [atorvastatin calcium]   Review of Systems Review of Systems Per HPI  Physical Exam Triage Vital Signs ED Triage Vitals  Encounter Vitals Group     BP 07/06/23 1923 (!) 150/82     Systolic BP Percentile --      Diastolic BP Percentile --      Pulse Rate 07/06/23 1923 76     Resp 07/06/23 1923 18     Temp 07/06/23 1923 98.5 F (36.9 C)     Temp Source 07/06/23 1923 Oral     SpO2 07/06/23 1923 99 %     Weight --      Height --      Head Circumference --      Peak Flow --      Pain Score 07/06/23 1920 8     Pain Loc --      Pain Education --      Exclude from Growth Chart --    No data found.  Updated Vital Signs BP (!) 150/82 (BP Location: Right Arm)   Pulse 76   Temp 98.5 F (36.9 C) (Oral)   Resp 18   SpO2 99%   Visual Acuity Right Eye Distance:   Left Eye  Distance:   Bilateral Distance:    Right Eye Near:   Left Eye Near:    Bilateral Near:     Physical Exam Vitals and nursing note reviewed.  Constitutional:      General: She is not in acute distress.    Appearance: She is not toxic-appearing.  HENT:     Mouth/Throat:     Mouth: Mucous membranes are moist.     Pharynx: Oropharynx is clear.  Pulmonary:     Effort: Pulmonary effort is normal. No respiratory distress.  Abdominal:     General: Abdomen is flat. Bowel sounds are normal. There is no distension.     Palpations: Abdomen is soft. There is no mass.     Tenderness: There is no abdominal tenderness. There is right CVA tenderness. There is no left CVA tenderness or guarding.  Skin:    General: Skin is warm and dry.     Coloration: Skin is not jaundiced or pale.     Findings: No erythema.  Neurological:     Mental Status: She is alert and oriented to person, place, and time.     Motor: No weakness.     Gait: Gait normal.  Psychiatric:  Behavior: Behavior is cooperative.      UC Treatments / Results  Labs (all labs ordered are listed, but only abnormal results are displayed) Labs Reviewed  POCT URINALYSIS DIP (MANUAL ENTRY) - Abnormal; Notable for the following components:      Result Value   Color, UA straw (*)    Blood, UA moderate (*)    All other components within normal limits  URINE CULTURE    EKG   Radiology No results found.  Procedures Procedures (including critical care time)  Medications Ordered in UC Medications - No data to display  Initial Impression / Assessment and Plan / UC Course  I have reviewed the triage vital signs and the nursing notes.  Pertinent labs & imaging results that were available during my care of the patient were reviewed by me and considered in my medical decision making (see chart for details).  Patient is mildly hypertensive in triage today, otherwise vital signs are stable.  1. Right flank pain 2.  Hematuria, unspecified type Urinalysis today shows moderate blood, otherwise unremarkable Low suspicion for UTI, I am highly suspicious for nephrolithiasis Treat with Flomax nightly, continue to push fluids and start Tylenol as needed for pain Strict ER precautions discussed with patient  The patient was given the opportunity to ask questions.  All questions answered to their satisfaction.  The patient is in agreement to this plan.    Final Clinical Impressions(s) / UC Diagnoses   Final diagnoses:  Right flank pain  Hematuria, unspecified type     Discharge Instructions      As we discussed, I am concerned you have a kidney stone.  Start taking the Flomax as prescribed to help relax your lower urinary muscles.  Take this at night time.  Continue drinking plenty of water.  You can take Tylenol [phone] mg every 6 hours for pain as needed.  If you develop severe abdominal pain, severe back pain, nausea/vomiting and are unable to keep fluids down, please seek care.   ED Prescriptions     Medication Sig Dispense Auth. Provider   tamsulosin (FLOMAX) 0.4 MG CAPS capsule Take 1 capsule (0.4 mg total) by mouth at bedtime as needed. 30 capsule Valentino Nose, NP      PDMP not reviewed this encounter.   Valentino Nose, NP 07/07/23 [phone]

## 2023-07-08 ENCOUNTER — Encounter: Payer: Self-pay | Admitting: Family Medicine

## 2023-07-28 ENCOUNTER — Other Ambulatory Visit: Payer: Self-pay | Admitting: Nurse Practitioner

## 2023-08-03 NOTE — Telephone Encounter (Signed)
 Called and spoke with patient. She said she will follow up with her endocrinologist and keep the appointment she currently has with provider in July

## 2023-11-12 ENCOUNTER — Encounter: Payer: Self-pay | Admitting: Family Medicine

## 2023-11-12 ENCOUNTER — Ambulatory Visit: Payer: BC Managed Care – PPO | Admitting: Family Medicine

## 2023-11-12 VITALS — BP 128/86 | HR 68 | Temp 98.1°F | Resp 20 | Ht 64.0 in | Wt 260.8 lb

## 2023-11-12 DIAGNOSIS — E785 Hyperlipidemia, unspecified: Secondary | ICD-10-CM

## 2023-11-12 DIAGNOSIS — I10 Essential (primary) hypertension: Secondary | ICD-10-CM | POA: Diagnosis not present

## 2023-11-12 DIAGNOSIS — E1165 Type 2 diabetes mellitus with hyperglycemia: Secondary | ICD-10-CM | POA: Diagnosis not present

## 2023-11-12 DIAGNOSIS — G4733 Obstructive sleep apnea (adult) (pediatric): Secondary | ICD-10-CM

## 2023-11-12 DIAGNOSIS — D509 Iron deficiency anemia, unspecified: Secondary | ICD-10-CM | POA: Diagnosis not present

## 2023-11-12 DIAGNOSIS — Z91013 Allergy to seafood: Secondary | ICD-10-CM

## 2023-11-12 LAB — CBC WITH DIFFERENTIAL/PLATELET
Basophils Absolute: 0 K/uL (ref 0.0–0.1)
Basophils Relative: 0.4 % (ref 0.0–3.0)
Eosinophils Absolute: 0.1 K/uL (ref 0.0–0.7)
Eosinophils Relative: 3.1 % (ref 0.0–5.0)
HCT: 37.9 % (ref 36.0–46.0)
Hemoglobin: 12.2 g/dL (ref 12.0–15.0)
Lymphocytes Relative: 40.3 % (ref 12.0–46.0)
Lymphs Abs: 1.9 K/uL (ref 0.7–4.0)
MCHC: 32.3 g/dL (ref 30.0–36.0)
MCV: 93.7 fl (ref 78.0–100.0)
Monocytes Absolute: 0.3 K/uL (ref 0.1–1.0)
Monocytes Relative: 7.4 % (ref 3.0–12.0)
Neutro Abs: 2.3 K/uL (ref 1.4–7.7)
Neutrophils Relative %: 48.8 % (ref 43.0–77.0)
Platelets: 262 K/uL (ref 150.0–400.0)
RBC: 4.04 Mil/uL (ref 3.87–5.11)
RDW: 15.1 % (ref 11.5–15.5)
WBC: 4.6 K/uL (ref 4.0–10.5)

## 2023-11-12 LAB — LIPID PANEL
Cholesterol: 200 mg/dL (ref 0–200)
HDL: 49.3 mg/dL (ref >39.00)
LDL Cholesterol: 133 mg/dL — ABNORMAL HIGH (ref 0–99)
NonHDL: 150.22
Total CHOL/HDL Ratio: 4
Triglycerides: 87 mg/dL (ref 0.0–149.0)
VLDL: 17.4 mg/dL (ref 0.0–40.0)

## 2023-11-12 LAB — COMPREHENSIVE METABOLIC PANEL WITH GFR
ALT: 9 U/L (ref 0–35)
AST: 10 U/L (ref 0–37)
Albumin: 4 g/dL (ref 3.5–5.2)
Alkaline Phosphatase: 64 U/L (ref 39–117)
BUN: 21 mg/dL (ref 6–23)
CO2: 30 meq/L (ref 19–32)
Calcium: 9.5 mg/dL (ref 8.4–10.5)
Chloride: 105 meq/L (ref 96–112)
Creatinine, Ser: 0.64 mg/dL (ref 0.40–1.20)
GFR: 97.76 mL/min (ref >60.00)
Glucose, Bld: 97 mg/dL (ref 70–99)
Potassium: 3.9 meq/L (ref 3.5–5.1)
Sodium: 141 meq/L (ref 135–145)
Total Bilirubin: 0.6 mg/dL (ref 0.2–1.2)
Total Protein: 6.8 g/dL (ref 6.0–8.3)

## 2023-11-12 LAB — MICROALBUMIN / CREATININE URINE RATIO
Creatinine,U: 55.9 mg/dL
Microalb Creat Ratio: 88.3 mg/g — ABNORMAL HIGH (ref 0.0–30.0)
Microalb, Ur: 4.9 mg/dL — ABNORMAL HIGH (ref 0.0–1.9)

## 2023-11-12 LAB — HEMOGLOBIN A1C: Hgb A1c MFr Bld: 5.6 % (ref 4.6–6.5)

## 2023-11-12 MED ORDER — EPINEPHRINE 0.3 MG/0.3ML IJ SOAJ: 0.3000 mg | INTRAMUSCULAR | 1 refills | Status: DC | PRN

## 2023-11-12 NOTE — Patient Instructions (Signed)

## 2023-11-12 NOTE — Assessment & Plan Note (Signed)
 Pt is working on diet and exercise and has lost 65 lbs  She is hoping to lose 50 lbs in the next year

## 2023-11-12 NOTE — Assessment & Plan Note (Signed)
 Hgba1c to be checked , minimize simple carbs. Increase exercise as tolerated. Continue current meds

## 2023-11-12 NOTE — Progress Notes (Signed)
 Established Patient Office Visit  Subjective   Patient ID: Tiffany Gallagher, female    DOB: 1965/05/15  Age: 58 y.o. MRN: [id number]  Chief Complaint  Patient presents with   Hypertension   Hyperlipidemia   Diabetes   Follow-up    HPI Discussed the use of AI scribe software for clinical note transcription with the patient, who gave verbal consent to proceed.  History of Present Illness Tiffany Gallagher is a 58 year old female with diabetes who presents for a follow-up visit.  She manages her diabetes primarily through dietary changes and takes her prescribed diabetes medication approximately once a week or when she feels it is necessary. She is interested in weight loss and has discussed the potential benefits of Mounjaro , although she has not yet obtained it due to cost issues. She has been focusing on natural remedies, such as turmeric shots, to help with inflammation and is curious about her lab results reflecting her current management strategy.  She has experienced significant weight loss, reducing her weight from 316 pounds to 252 pounds, although she has recently gained back 8 pounds. She attributes her weight management success to dietary changes and aims to lose an additional 50 pounds by next year.  She experiences severe cramping in her leg, which impedes walking. She suspects it may be related to low potassium levels, as she has a history of fluctuating potassium. Her son had to assist her with walking due to the severity of the cramping.  She has not received the shingles vaccine and has been to the eye doctor, although she is dissatisfied with the virtual consultation format. Her glasses are not functioning as expected for reading.   Patient Active Problem List   Diagnosis Date Noted   Type 2 diabetes mellitus with hyperglycemia, without long-term current use of insulin  (HCC) 10/20/2022   History of total vaginal hysterectomy (TVH) 07/04/2020    Healthcare maintenance 04/26/2020   It band syndrome, right 04/24/2020   Hyperlipidemia 09/19/2019   Mild intermittent asthma without complication 09/19/2019   Hair loss 09/19/2019   Dyspepsia 09/19/2019   Right hip pain 09/19/2019   Leg skin lesion, right 06/09/2017   OSA (obstructive sleep apnea) 06/05/2016   Preventative health care 02/09/2015   Acute bacterial bronchitis 11/15/2014   Severe obesity (BMI >= 40) (HCC) 05/10/2014   Physical exam 01/20/2014   HSV-1 infection 06/20/2013   Migraines    Smoker    Sinusitis 04/11/2011   Leg cramps 09/18/2010   Edema 09/18/2010   VAGINAL DISCHARGE 09/20/2009   KNEE PAIN, BILATERAL 09/20/2009   FLATULENCE ERUCTATION AND GAS PAIN 08/03/2009   HEMATURIA, HX OF 07/04/2009   Essential hypertension 02/07/2008   GERD 11/30/2006   Past Medical History:  Diagnosis Date   Allergy    Diabetes mellitus without complication (HCC)    Diverticulitis    Fibroid    Hyperlipidemia    Hypertension    Migraines    Obesity, morbid, BMI 40.0-49.9 (HCC)    Sleep apnea    cpap   Past Surgical History:  Procedure Laterality Date   bone spur     removed   CESAREAN SECTION  1995   VAGINAL HYSTERECTOMY  1998   partial   Social History   Tobacco Use   Smoking status: Every Day    Current packs/day: 1.00    Average packs/day: 0.5 packs/day for 32.6 years (16.6 ttl pk-yrs)  Types: Cigarettes    Start date: 2025   Smokeless tobacco: Never   Tobacco comments:    0.5-1 ppd   04/30/2023  Vaping Use   Vaping status: Never Used  Substance Use Topics   Alcohol use: Yes    Comment: Occas wine   Drug use: Yes    Types: Marijuana    Comment: occ   Social History   Socioeconomic History   Marital status: Divorced    Spouse name: Not on file   Number of children: 1   Years of education: 17   Highest education level: Bachelor's degree (e.g., BA, AB, BS)  Occupational History   Occupation: customer service   Tobacco Use   Smoking  status: Every Day    Current packs/day: 1.00    Average packs/day: 0.5 packs/day for 32.6 years (16.6 ttl pk-yrs)    Types: Cigarettes    Start date: 2025   Smokeless tobacco: Never   Tobacco comments:    0.5-1 ppd   04/30/2023  Vaping Use   Vaping status: Never Used  Substance and Sexual Activity   Alcohol use: Yes    Comment: Occas wine   Drug use: Yes    Types: Marijuana    Comment: occ   Sexual activity: Not Currently    Partners: Male    Birth control/protection: Surgical, Condom  Other Topics Concern   Not on file  Social History Narrative   Exercise-- just starting to walk 4 -7 days a week , yoga    Social Drivers of Health   Financial Resource Strain: Low Risk  (01/16/2023)   Overall Financial Resource Strain (CARDIA)    Difficulty of Paying Living Expenses: Not hard at all  Food Insecurity: No Food Insecurity (01/16/2023)   Hunger Vital Sign    Worried About Running Out of Food in the Last Year: Never true    Ran Out of Food in the Last Year: Never true  Transportation Needs: No Transportation Needs (01/16/2023)   PRAPARE - Administrator, Civil Service (Medical): No    Lack of Transportation (Non-Medical): No  Physical Activity: Insufficiently Active (01/16/2023)   Exercise Vital Sign    Days of Exercise per Week: 4 days    Minutes of Exercise per Session: 30 min  Stress: No Stress Concern Present (01/16/2023)   [employer] of Occupational Health - Occupational Stress Questionnaire    Feeling of Stress : Not at all  Social Connections: Moderately Integrated (01/16/2023)   Social Connection and Isolation Panel    Frequency of Communication with Friends and Family: More than three times a week    Frequency of Social Gatherings with Friends and Family: Once a week    Attends Religious Services: More than 4 times per year    Active Member of [employer] or Organizations: Yes    Attends Engineer, structural: More than 4 times per year    Marital  Status: Divorced  Catering manager Violence: Not on file   Family Status  Relation Name Status   Mother  Deceased   Father  Deceased   Sister theresa Alive   Sister  Alive   Brother victor Alive   Brother willie Alive  No partnership data on file   Family History  Problem Relation Age of Onset   Coronary artery disease Mother    Hypertension Mother    Kidney disease Mother 79       dialysis   Aneurysm Mother  brain   Stroke Mother    Heart disease Mother        MI   Cancer Father 63       brain cancer   Diabetes Sister    Hyperlipidemia Sister    Hypertension Sister    Hypertension Sister    Arthritis Brother    Hypertension Brother    Diabetes Brother    Hypertension Brother    Heart attack Brother    Allergies  Allergen Reactions   Fish Allergy Hives   Egg-Derived Products    Lactose Intolerance (Gi)    Milk-Related Compounds Other (See Comments)   Valtrex  [Valacyclovir ]     itching   Lipitor [Atorvastatin  Calcium ]     Nerves, muscle spasms, light stool   Shellfish Allergy Hives      ROS    Objective:     BP 128/86 (BP Location: Left Arm, Patient Position: Sitting, Cuff Size: Large)   Pulse 68   Temp 98.1 F (36.7 C) (Oral)   Resp 20   Ht 5' 4 (1.626 m)   Wt 260 lb 12.8 oz (118.3 kg)   SpO2 98%   BMI 44.77 kg/m  BP Readings from Last 3 Encounters:  11/12/23 128/86  07/06/23 (!) 150/82  05/03/23 136/89   Wt Readings from Last 3 Encounters:  11/12/23 260 lb 12.8 oz (118.3 kg)  04/30/23 262 lb 9.6 oz (119.1 kg)  04/03/23 261 lb 12.8 oz (118.8 kg)   SpO2 Readings from Last 3 Encounters:  11/12/23 98%  07/06/23 99%  05/03/23 98%      Physical Exam   No results found for any visits on 11/12/23.  Last CBC Lab Results  Component Value Date   WBC 5.4 04/30/2023   HGB 12.3 04/30/2023   HCT 38.1 04/30/2023   MCV 94.5 04/30/2023   MCH 31.1 10/19/2022   RDW 15.4 04/30/2023   PLT 322.0 04/30/2023   Last metabolic panel Lab  Results  Component Value Date   GLUCOSE 88 04/30/2023   NA 140 04/30/2023   K 4.0 04/30/2023   CL 103 04/30/2023   CO2 31 04/30/2023   BUN 16 04/30/2023   CREATININE 0.62 04/30/2023   GFR 98.89 04/30/2023   CALCIUM  9.6 04/30/2023   PHOS 2.9 09/18/2010   PROT 7.2 04/30/2023   ALBUMIN 4.2 04/30/2023   BILITOT 0.6 04/30/2023   ALKPHOS 62 04/30/2023   AST 13 04/30/2023   ALT 10 04/30/2023   ANIONGAP 15 10/19/2022   Last lipids Lab Results  Component Value Date   CHOL 190 04/30/2023   HDL 47.80 04/30/2023   LDLCALC 123 (H) 04/30/2023   LDLDIRECT 172.0 10/20/2022   TRIG 96.0 04/30/2023   CHOLHDL 4 04/30/2023   Last hemoglobin A1c Lab Results  Component Value Date   HGBA1C 5.5 04/30/2023   Last thyroid  functions Lab Results  Component Value Date   TSH 1.98 04/30/2023   T4TOTAL 9.2 09/16/2019   Last vitamin D  Lab Results  Component Value Date   VD25OH 25.30 (L) 04/25/2021   Last vitamin B12 and Folate Lab Results  Component Value Date   VITAMINB12 174 (L) 09/16/2019   FOLATE 6.0 09/16/2019      The 10-year ASCVD risk score (Arnett DK, et al., 2019) is: 25.7%    Assessment & Plan:   Problem List Items Addressed This Visit       Unprioritized   Type 2 diabetes mellitus with hyperglycemia, without long-term current use of insulin  (HCC) - Primary  Hgba1c to be checked,  minimize simple carbs. Increase exercise as tolerated. Continue current meds       Relevant Orders   Comprehensive metabolic panel with GFR   Hemoglobin A1c   Microalbumin / creatinine urine ratio   Severe obesity (BMI >= 40) (HCC)   Pt is working on diet and exercise and has lost 65 lbs  She is hoping to lose 50 lbs in the next year       OSA (obstructive sleep apnea)   Con't cpap      Hyperlipidemia   Tolerating statin, encouraged heart healthy diet, avoid trans fats, minimize simple carbs and saturated fats. Increase exercise as tolerated       Relevant Medications    EPINEPHrine  (EPIPEN  2-PAK) 0.3 mg/0.3 mL IJ SOAJ injection   Other Relevant Orders   Lipid panel   Comprehensive metabolic panel with GFR   Essential hypertension   Well controlled, no changes to meds. Encouraged heart healthy diet such as the DASH diet and exercise as tolerated.        Relevant Medications   EPINEPHrine  (EPIPEN  2-PAK) 0.3 mg/0.3 mL IJ SOAJ injection   Other Relevant Orders   Lipid panel   CBC with Differential/Platelet   Comprehensive metabolic panel with GFR   Hemoglobin A1c   Microalbumin / creatinine urine ratio   Other Visit Diagnoses       Iron deficiency anemia, unspecified iron deficiency anemia type         Fish allergy       Relevant Medications   EPINEPHrine  (EPIPEN  2-PAK) 0.3 mg/0.3 mL IJ SOAJ injection     Assessment and Plan Assessment & Plan Type 2 Diabetes Mellitus   Type 2 diabetes is primarily managed through dietary modifications. She takes medication infrequently, about once a week, and is interested in Mounjaro  (tirzepatide ) for weight loss, which also aids glycemic control. Despite significant weight loss, recent weight gain has occurred. She is considering obtaining Mounjaro  through a discount program. Mounjaro  does not significantly lower blood glucose if already controlled through diet and is marketed as Zepbound  for weight loss in non-diabetic individuals. Order a comprehensive metabolic panel including HbA1c. Discuss the Mounjaro  discount program with the pharmacy. Encourage continued dietary management and weight loss efforts.  Leg Cramping   Severe leg cramping may be related to low potassium levels, with a history of fluctuating potassium levels. The cramping impairs mobility. Potassium supplementation may be necessary if levels are below 4 mmol/L. Order a serum potassium level. Advise dietary intake of potassium-rich foods such as orange juice, bananas, and green leafy vegetables. Consider magnesium  supplementation. Advise use of tonic  water or pickle juice for cramping relief. Consider potassium supplementation if serum potassium is below 4 mmol/L.  General Health Maintenance   She has not received the shingles vaccine and is dissatisfied with virtual eye consultations, considering changing providers. Regular eye examinations are important, especially due to diabetes. Encourage regular eye examinations and consider changing to a preferred eye care provider.    Return in about 6 months (around 05/14/2024), or if symptoms worsen or fail to improve, for annual exam, fasting.    Yue Flanigan R Lowne Chase, DO

## 2023-11-12 NOTE — Assessment & Plan Note (Signed)
 Tolerating statin, encouraged heart healthy diet, avoid trans fats, minimize simple carbs and saturated fats. Increase exercise as tolerated

## 2023-11-12 NOTE — Assessment & Plan Note (Signed)
 Cont cpap

## 2023-11-12 NOTE — Assessment & Plan Note (Signed)
 Well controlled, no changes to meds. Encouraged heart healthy diet such as the DASH diet and exercise as tolerated.

## 2023-11-15 ENCOUNTER — Ambulatory Visit: Payer: Self-pay | Admitting: Family Medicine

## 2023-11-23 DIAGNOSIS — L820 Inflamed seborrheic keratosis: Secondary | ICD-10-CM | POA: Diagnosis not present

## 2023-11-23 DIAGNOSIS — D485 Neoplasm of uncertain behavior of skin: Secondary | ICD-10-CM | POA: Diagnosis not present

## 2023-11-23 DIAGNOSIS — L509 Urticaria, unspecified: Secondary | ICD-10-CM | POA: Diagnosis not present

## 2023-11-23 DIAGNOSIS — B079 Viral wart, unspecified: Secondary | ICD-10-CM | POA: Diagnosis not present

## 2023-12-18 ENCOUNTER — Ambulatory Visit: Payer: Self-pay

## 2023-12-18 ENCOUNTER — Other Ambulatory Visit: Payer: Self-pay | Admitting: Family Medicine

## 2023-12-18 DIAGNOSIS — Z1231 Encounter for screening mammogram for malignant neoplasm of breast: Secondary | ICD-10-CM

## 2023-12-18 NOTE — Telephone Encounter (Signed)
 FYI Only or Action Required?: FYI only for provider.  Patient was last seen in primary care on 11/12/2023 by Antonio Meth, Tiffany SAUNDERS, DO.  Called Nurse Triage reporting Rash.  Symptoms began several weeks ago.  Interventions attempted: Nothing.  Symptoms are: unchanged.  Triage Disposition: See PCP When Office is Open (Within 3 Days)  Patient/caregiver understands and will follow disposition?: Yes   Reason for Disposition  Mild widespread rash  (Exception: Heat rash lasting 3 days or less.)  Answer Assessment - Initial Assessment Questions Patient has appt scheduled on 12/22/23. This RN advised to be evaluated at [facility] or the ED if rash spreads to face or if she develops other symptoms like fever.  1. APPEARANCE of RASH: What does the rash look like? (e.g., blisters, dry flaky skin, red spots, redness, sores)     Raised red bumps with no discharge  2. SIZE: How big are the spots? (e.g., tip of pen, eraser, coin; inches, centimeters)     Pencil tip to less than a dime  3. LOCATION: Where is the rash located?     Arms and legs  4. COLOR: What color is the rash? (Note: It is difficult to assess rash color in people with darker-colored skin. When this situation occurs, simply ask the caller to describe what they see.)     Red  5. ONSET: When did the rash begin?     2 weeks, states worse when eating sugar  6. FEVER: Do you have a fever? If Yes, ask: What is your temperature, how was it measured, and when did it start?     Denies  7. ITCHING: Does the rash itch? If Yes, ask: How bad is the itch? (Scale 1-10; or mild, moderate, severe)     Sometimes is itchy  Protocols used: Rash or Redness - [facility]

## 2023-12-22 ENCOUNTER — Other Ambulatory Visit: Payer: Self-pay | Admitting: Family Medicine

## 2023-12-22 ENCOUNTER — Ambulatory Visit (INDEPENDENT_AMBULATORY_CARE_PROVIDER_SITE_OTHER): Admitting: Family Medicine

## 2023-12-22 ENCOUNTER — Encounter: Payer: Self-pay | Admitting: Family Medicine

## 2023-12-22 VITALS — BP 138/88 | HR 88 | Temp 98.5°F | Resp 18 | Ht 64.0 in | Wt 256.4 lb

## 2023-12-22 DIAGNOSIS — H04129 Dry eye syndrome of unspecified lacrimal gland: Secondary | ICD-10-CM | POA: Diagnosis not present

## 2023-12-22 DIAGNOSIS — L309 Dermatitis, unspecified: Secondary | ICD-10-CM | POA: Diagnosis not present

## 2023-12-22 NOTE — Assessment & Plan Note (Signed)
 Use refresh or systane drops

## 2023-12-22 NOTE — Assessment & Plan Note (Signed)
 Almost completely resolved  Con't with antihistamine and cortisone cream

## 2023-12-22 NOTE — Progress Notes (Signed)
 ++  Subjective:    Patient ID: Tiffany Gallagher, female    DOB: [birth year], 58 y.o.   MRN: [id number]  Chief Complaint  Patient presents with   Rash    X3 weeks, arms and legs, Pt states having some itching but states bumps going away.     HPI Patient is in today for rash.  Discussed the use of AI scribe software for clinical note transcription with the patient, who gave verbal consent to proceed.  History of Present Illness Tiffany Gallagher is a 58 year old female who presents with itchy skin bumps and concerns about HPV.  She developed small, itchy bumps following the removal of a non-cancerous wart. The bumps, described as 'little bumps', come and go, causing itching. They have mostly resolved. She has been using Zyrtec and cortisone cream to manage the itching, which has helped alleviate the symptoms.  She has a history of HSV-1, associated with cold sores, but notes that the bumps were not blisters and were very tiny. The bumps have been present for about three weeks, coinciding with the resumption of metformin  for her diabetes management. She questions if metformin  could be causing the skin reaction, although she had no prior issues with it.  She has a history of allergic reactions to other medications, which caused widespread itching. Currently, she experiences some itching in her eyes, which she suspects might be related to dry eyes or an allergic reaction. She has been using antihistamines like Zyrtec and Benadryl  to manage her symptoms.  Her current medications include metformin , pitavastatin , and blood pressure medication. She expresses concern about potential allergies to pitavastatin , as she has had allergic reactions to other cholesterol medications in the past.  No recent tick bites or other insect bites. Reports itching in the eyes and small itchy bumps on the skin.    Past Medical History:  Diagnosis Date   Allergy    Diabetes mellitus without  complication (HCC)    Diverticulitis    Fibroid    Hyperlipidemia    Hypertension    Migraines    Obesity, morbid, BMI 40.0-49.9 (HCC)    Sleep apnea    cpap    Past Surgical History:  Procedure Laterality Date   bone spur     removed   CESAREAN SECTION  1995   VAGINAL HYSTERECTOMY  1998   partial    Family History  Problem Relation Age of Onset   Coronary artery disease Mother    Hypertension Mother    Kidney disease Mother 55       dialysis   Aneurysm Mother        brain   Stroke Mother    Heart disease Mother        MI   Cancer Father 22       brain cancer   Diabetes Sister    Hyperlipidemia Sister    Hypertension Sister    Hypertension Sister    Arthritis Brother    Hypertension Brother    Diabetes Brother    Hypertension Brother    Heart attack Brother     Social History   Socioeconomic History   Marital status: Divorced    Spouse name: Not on file   Number of children: 1   Years of education: 17   Highest education level: Bachelor's degree (e.g., BA, AB, BS)  Occupational History   Occupation: customer service   Tobacco Use   Smoking status: Every Day  Current packs/day: 1.00    Average packs/day: 0.5 packs/day for 32.7 years (16.7 ttl pk-yrs)    Types: Cigarettes    Start date: 2025   Smokeless tobacco: Never   Tobacco comments:    0.5-1 ppd   04/30/2023  Vaping Use   Vaping status: Never Used  Substance and Sexual Activity   Alcohol use: Yes    Comment: Occas wine   Drug use: Yes    Types: Marijuana    Comment: occ   Sexual activity: Not Currently    Partners: Male    Birth control/protection: Surgical, Condom  Other Topics Concern   Not on file  Social History Narrative   Exercise-- just starting to walk 4 -7 days a week , yoga    Social Drivers of Health   Financial Resource Strain: Low Risk  (12/22/2023)   Overall Financial Resource Strain (CARDIA)    Difficulty of Paying Living Expenses: Not hard at all  Food Insecurity: No  Food Insecurity (12/22/2023)   Hunger Vital Sign    Worried About Running Out of Food in the Last Year: Never true    Ran Out of Food in the Last Year: Never true  Transportation Needs: No Transportation Needs (12/22/2023)   PRAPARE - Administrator, Civil Service (Medical): No    Lack of Transportation (Non-Medical): No  Physical Activity: Insufficiently Active (12/22/2023)   Exercise Vital Sign    Days of Exercise per Week: 4 days    Minutes of Exercise per Session: 30 min  Stress: No Stress Concern Present (12/22/2023)   [employer] of Occupational Health - Occupational Stress Questionnaire    Feeling of Stress: Only a little  Social Connections: Moderately Isolated (12/22/2023)   Social Connection and Isolation Panel    Frequency of Communication with Friends and Family: Three times a week    Frequency of Social Gatherings with Friends and Family: Three times a week    Attends Religious Services: More than 4 times per year    Active Member of Clubs or Organizations: No    Attends Engineer, structural: Not on file    Marital Status: Divorced  Catering manager Violence: Not on file    Outpatient Medications Prior to Visit  Medication Sig Dispense Refill   Accu-Chek Softclix Lancets lancets 1 EACH BY DOES NOT APPLY ROUTE IN THE MORNING, AT NOON, AND AT BEDTIME 100 each 2   acyclovir  ointment (ZOVIRAX ) 5 % Apply three times per day as needed 30 g 2   albuterol  (VENTOLIN  HFA) 108 (90 Base) MCG/ACT inhaler Inhale 2 puffs into the lungs every 4 (four) hours as needed for wheezing or shortness of breath. 1 each 0   Ascorbic Acid (VITAMIN C) 100 MG tablet Take 100 mg by mouth daily.     BD PEN NEEDLE NANO 2ND GEN 32G X 4 MM MISC TO USE W/ LANTUS  100 each 1   benazepril  (LOTENSIN ) 20 MG tablet Take 1 tablet (20 mg total) by mouth daily. 90 tablet 3   Blood Glucose Monitoring Suppl DEVI 1 each by Does not apply route in the morning, at noon, and at bedtime. May substitute  to any manufacturer covered by patient's insurance. 1 each 0   CALCIUM  PO Take 1 tablet by mouth daily.     EPINEPHrine  (EPIPEN  2-PAK) 0.3 mg/0.3 mL IJ SOAJ injection Inject 0.3 mg into the muscle as needed for anaphylaxis. 1 each 1   Glucose Blood (BLOOD GLUCOSE TEST STRIPS) STRP  Check blood sugars 3 times daily May substitute to any manufacturer covered by patient's insurance. 300 strip 12   hydrochlorothiazide  (HYDRODIURIL ) 25 MG tablet Take 1 tablet (25 mg total) by mouth daily. 90 tablet 3   metFORMIN  (GLUCOPHAGE -XR) 500 MG 24 hr tablet TAKE 2 TABLETS (1,000 MG TOTAL) BY MOUTH 2 (TWO) TIMES DAILY WITH A MEAL. 360 tablet 1   Multiple Vitamin (MULTIVITAMIN WITH MINERALS) TABS Take 1 tablet by mouth daily.     omeprazole  (PRILOSEC) 20 MG capsule Take 1 capsule (20 mg total) by mouth daily. 90 capsule 3   OVER THE COUNTER MEDICATION Take by mouth daily. [employer]way multivitamin     OVER THE COUNTER MEDICATION Take by mouth daily. [employer]way joint supplement     Pitavastatin  Calcium  2 MG TABS Take 1 tablet (2 mg total) by mouth daily. 90 tablet 3   tamsulosin  (FLOMAX ) 0.4 MG CAPS capsule TAKE 1 CAPSULE (0.4 MG TOTAL) BY MOUTH AT BEDTIME AS NEEDED. 90 capsule 1   thiamine (VITAMIN B-1) 100 MG tablet Take 100 mg by mouth daily.     tirzepatide  (MOUNJARO ) 2.5 MG/0.5ML Pen Inject 2.5 mg into the skin once a week. (Patient taking differently: Inject 2.5 mg into the skin once a week. HAS NOT STARTED THIS MEDICINE YET) 2 mL 0   triamcinolone  cream (KENALOG) 0.1 % Apply 1 Application topically 2 (two) times daily.     VITAMIN D  PO Take 4,000 Units by mouth daily.     metFORMIN  (GLUCOPHAGE ) 1000 MG tablet Take 1,000 mg by mouth 2 (two) times daily.     No facility-administered medications prior to visit.    Allergies  Allergen Reactions   Fish Allergy Hives   Egg-Derived Products    Lactose Intolerance (Gi)    Milk-Related Compounds Other (See Comments)   Valtrex  [Valacyclovir ]     itching   Lipitor  [Atorvastatin  Calcium ]     Nerves, muscle spasms, light stool   Shellfish Allergy Hives    Review of Systems  Constitutional:  Negative for chills, fever and malaise/fatigue.  HENT:  Negative for congestion and hearing loss.   Eyes:  Negative for blurred vision and discharge.  Respiratory:  Negative for cough, sputum production and shortness of breath.   Cardiovascular:  Negative for chest pain, palpitations and leg swelling.  Gastrointestinal:  Negative for abdominal pain, blood in stool, constipation, diarrhea, heartburn, nausea and vomiting.  Genitourinary:  Negative for dysuria, frequency, hematuria and urgency.  Musculoskeletal:  Negative for back pain, falls and myalgias.  Skin:  Positive for itching and rash.  Neurological:  Negative for dizziness, sensory change, loss of consciousness, weakness and headaches.  Endo/Heme/Allergies:  Negative for environmental allergies. Does not bruise/bleed easily.  Psychiatric/Behavioral:  Negative for depression and suicidal ideas. The patient is not nervous/anxious and does not have insomnia.        Objective:    Physical Exam Vitals and nursing note reviewed.  Constitutional:      General: She is not in acute distress.    Appearance: Normal appearance. She is well-developed.  HENT:     Head: Normocephalic and atraumatic.  Eyes:     General: No scleral icterus.       Right eye: No discharge.        Left eye: No discharge.  Cardiovascular:     Rate and Rhythm: Normal rate and regular rhythm.     Heart sounds: No murmur heard. Pulmonary:     Effort: Pulmonary effort is normal. No  respiratory distress.     Breath sounds: Normal breath sounds.  Musculoskeletal:        General: Normal range of motion.     Cervical back: Normal range of motion and neck supple.     Right lower leg: No edema.     Left lower leg: No edema.  Skin:    General: Skin is warm and dry.     Findings: Rash present.  Neurological:     Mental Status: She is  alert and oriented to person, place, and time.  Psychiatric:        Mood and Affect: Mood normal.        Behavior: Behavior normal.        Thought Content: Thought content normal.        Judgment: Judgment normal.     BP 138/88 (BP Location: Left Arm, Patient Position: Sitting, Cuff Size: Large)   Pulse 88   Temp 98.5 F (36.9 C) (Oral)   Resp 18   Ht 5' 4 (1.626 m)   Wt 256 lb 6.4 oz (116.3 kg)   SpO2 95%   BMI 44.01 kg/m  Wt Readings from Last 3 Encounters:  12/22/23 256 lb 6.4 oz (116.3 kg)  11/12/23 260 lb 12.8 oz (118.3 kg)  04/30/23 262 lb 9.6 oz (119.1 kg)    Diabetic Foot Exam - Simple   No data filed    Lab Results  Component Value Date   WBC 4.6 11/12/2023   HGB 12.2 11/12/2023   HCT 37.9 11/12/2023   PLT 262.0 11/12/2023   GLUCOSE 97 11/12/2023   CHOL 200 11/12/2023   TRIG 87.0 11/12/2023   HDL 49.30 11/12/2023   LDLDIRECT 172.0 10/20/2022   LDLCALC 133 (H) 11/12/2023   ALT 9 11/12/2023   AST 10 11/12/2023   NA 141 11/12/2023   K 3.9 11/12/2023   CL 105 11/12/2023   CREATININE 0.64 11/12/2023   BUN 21 11/12/2023   CO2 30 11/12/2023   TSH 1.98 04/30/2023   HGBA1C 5.6 11/12/2023   MICROALBUR 4.9 (H) 11/12/2023    Lab Results  Component Value Date   TSH 1.98 04/30/2023   Lab Results  Component Value Date   WBC 4.6 11/12/2023   HGB 12.2 11/12/2023   HCT 37.9 11/12/2023   MCV 93.7 11/12/2023   PLT 262.0 11/12/2023   Lab Results  Component Value Date   NA 141 11/12/2023   K 3.9 11/12/2023   CO2 30 11/12/2023   GLUCOSE 97 11/12/2023   BUN 21 11/12/2023   CREATININE 0.64 11/12/2023   BILITOT 0.6 11/12/2023   ALKPHOS 64 11/12/2023   AST 10 11/12/2023   ALT 9 11/12/2023   PROT 6.8 11/12/2023   ALBUMIN 4.0 11/12/2023   CALCIUM  9.5 11/12/2023   ANIONGAP 15 10/19/2022   EGFR 86 03/12/2023   GFR 97.76 11/12/2023   Lab Results  Component Value Date   CHOL 200 11/12/2023   Lab Results  Component Value Date   HDL 49.30 11/12/2023    Lab Results  Component Value Date   LDLCALC 133 (H) 11/12/2023   Lab Results  Component Value Date   TRIG 87.0 11/12/2023   Lab Results  Component Value Date   CHOLHDL 4 11/12/2023   Lab Results  Component Value Date   HGBA1C 5.6 11/12/2023       Assessment & Plan:  Dermatitis Assessment & Plan: Almost completely resolved  Con't with antihistamine and cortisone cream   Dry eye Assessment &  Plan: Use refresh or systane drops    Assessment and Plan Assessment & Plan Allergic skin reaction with pruritus (urticaria and pruritus)   She experiences intermittent urticaria and pruritus, likely due to an allergic reaction. Possible triggers include a new iris cream or metformin , though metformin  was previously tolerated. Symptoms are resolving spontaneously, suggesting a transient cause. Differential includes an allergic reaction to a new product or environmental factor. Use over-the-counter cortisone cream on itchy spots and take Claritin or Zyrtec daily until symptoms resolve. Consider referral to an allergist if symptoms persist or recur.  Dry eye symptoms   She has intermittent dry eye symptoms with itching, possibly related to an allergic reaction. No previous diagnosis of dry eye from an ophthalmologist. Use lubricating eye drops like Refresh or Systane three to four times daily. Consider allergy eye drops if lubricating drops are ineffective, but be cautious as they may exacerbate dryness.  Type 2 diabetes mellitus   Type 2 diabetes mellitus is currently managed with metformin . Recent increase in blood glucose levels noted, possibly related to an allergic reaction or medication changes.  Hyperlipidemia   Hyperlipidemia is managed with pitavastatin .  Hypertension   Hypertension is currently managed with medication.  General Health Maintenance   She declined flu vaccination due to past experience of illness post-vaccination. Discussed the importance of flu vaccination  for immune support.  Follow-Up   Contact the office if symptoms do not resolve or flare up again. Schedule an appointment with an allergist if symptoms persist.    Sofie Schendel R Lowne Chase, DO

## 2023-12-25 ENCOUNTER — Other Ambulatory Visit: Payer: Self-pay | Admitting: Family Medicine

## 2023-12-25 ENCOUNTER — Encounter: Payer: Self-pay | Admitting: Family Medicine

## 2023-12-25 DIAGNOSIS — N760 Acute vaginitis: Secondary | ICD-10-CM

## 2023-12-25 MED ORDER — FLUCONAZOLE 150 MG PO TABS: 150.0000 mg | ORAL_TABLET | Freq: Every day | ORAL | 0 refills | Status: DC

## 2024-01-06 ENCOUNTER — Encounter: Payer: Self-pay | Admitting: Family Medicine

## 2024-01-08 ENCOUNTER — Telehealth: Payer: Self-pay

## 2024-01-08 NOTE — Telephone Encounter (Signed)
 Pt has appt scheduled with Dr. Antonio on 01/12/24

## 2024-01-08 NOTE — Telephone Encounter (Signed)
 Initial Comment Caller states she is experiencing a bruise on her arm and anxiety. She took an Aleve and she is diabetic. She was transferred from the scheduling line. Translation No Nurse Assessment Nurse: Gilford, RN, Olivia Date/Time (Eastern Time): 01/07/2024 4:56:09 PM Confirm and document reason for call. If symptomatic, describe symptoms. ---Caller reports that she has a tiny bruise to inner left arm. States that she took an Aleve and was advised that she should not take as she is diabetic. Bruise is smaller than a dime. She had some swelling to right leg that has resolved. Does the patient have any new or worsening symptoms? ---Yes Will a triage be completed? ---Yes Related visit to physician within the last 2 weeks? ---N/A Does the PT have any chronic conditions? (i.e. diabetes, asthma, this includes High risk factors for pregnancy, etc.) ---Yes List chronic conditions. ---diabetes Is this a behavioral health or substance abuse call? ---No Guidelines Guideline Title Affirmed Question Affirmed Notes Nurse Date/Time (Eastern Time) Bruises Minor injury or bruising from direct blow Shurden, RN, Schuyler 01/07/2024 5:01:35 PM Disp. Time Titus Time) Disposition Final User 01/07/2024 5:08:45 PM Home Care Yes Shurden, RN, Schuyler PLEASE NOTE: All timestamps contained within this report are represented as [place] Standard Time. CONFIDENTIALTY NOTICE: This fax transmission is intended only for the addressee. It contains information that is legally privileged, confidential or otherwise protected from use or disclosure. If you are not the intended recipient, you are strictly prohibited from reviewing, disclosing, copying using or disseminating any of this information or taking any action in reliance on or regarding this information. If you have received this fax in error, please notify us  immediately by telephone so that we can arrange for its return to us . Phone: [phone],  Toll-Free: [phone], Fax: [phone] [facility] 1965-11-16 Page: 1 of2 CallId: [id number] Final Disposition 01/07/2024 5:08:45 PM Home Care Yes Shurden, RN, Schuyler Caller Disagree/Comply Comply Caller Understands Yes PreDisposition Call Doctor Care Advice Given Per Guideline HOME CARE: * You should be able to treat this at home. * A direct blow can cause a bruise. Contusion is the medical term for bruise from an injury. * Put a cold pack or an ice bag (wrapped in a moist towel) on the area for 20 minutes. * Repeat in 1 hour, then every 4 hours while awake. USE HEAT ON AREA AFTER 48 HOURS: * If pain, swelling, or bruising lasts more than 48 hours (2 days), then use heat on the area. CALL BACK IF: * Bruise is not fading by 10 days * Bruise lasts over 3 weeks * You become worse CARE ADVICE given per Bruises (Adult) guideline. Comments User: Schuyler Gilford, RN Date/Time Titus Time): 01/07/2024 5:12:50 PM caller reports that she takes metformin  at times. Per drugs.com, noted potential interaction between aleve and metformin . Advised that if she is taking metformin , to avoid aleve in the future. Reviewed potential s/s of lactic acidosis as follows: fruity-smelling breath jaundice (yellowing of the skin or the whites of the eyes trouble breathing or shallow, rapid breathing exhaustion or extreme fatigue muscle cramps or pain body weakness abdominal pain or discomfort diarrhea decrease in appetite headache rapid heart rate Caller denies these s/s. [employer]am[employer]com https://www.drugs.com/interactions-check.php? drug_list=[email or web address],1573-0

## 2024-01-11 ENCOUNTER — Ambulatory Visit

## 2024-01-12 ENCOUNTER — Ambulatory Visit: Admitting: Family Medicine

## 2024-01-18 ENCOUNTER — Ambulatory Visit
Admission: RE | Admit: 2024-01-18 | Discharge: 2024-01-18 | Disposition: A | Discharge status: Home / Self Care | Source: Ambulatory Visit | Attending: Family Medicine | Admitting: Family Medicine

## 2024-01-18 DIAGNOSIS — Z1231 Encounter for screening mammogram for malignant neoplasm of breast: Secondary | ICD-10-CM

## 2024-01-18 NOTE — Progress Notes (Signed)
 Analya Louissaint                                          MRN: [id number]   01/18/2024   The [facility] Quality Team Specialist reviewed this patient medical record for the purposes of chart review for care gap closure. The following were reviewed: abstraction for care gap closure-kidney health evaluation for diabetes:eGFR  and uACR.    [facility] Quality Team

## 2024-02-25 NOTE — Progress Notes (Unsigned)
 Tiffany Gallagher Jan 17, 1966 [id number]   History:  58 y.o. G2P1011 presents for annual exam. Postmenopausal - no HRT. S/P 1998 TVH for fibroids. Normal pap history. History of HSV-1 with occasional genital and oral outbreaks. She tried Valtrex  daily in the past but had itching with continuous use. She does use acyclovir  ointment. HTN, HLD managed by PCP. Smoker.   Gynecologic History No LMP recorded. Patient has had a hysterectomy.   Contraception/Family planning: status post hysterectomy Sexually active: No  Health Maintenance Last Pap: 06/12/2010. Results were: Normal Last mammogram: 01/18/2024. Results were: Normal Last colonoscopy: 05/19/2016. Results were: Benign polyps, 10-year recall Last Dexa: 4-5 years ago. Results were: Normal (managed by PCP)     02/26/2024    7:42 AM  Depression screen PHQ 2/9  Decreased Interest 0  Down, Depressed, Hopeless 0  PHQ - 2 Score 0     Past medical history, past surgical history, family history and social history were all reviewed and documented in the EPIC chart. Divorced. Works in [employer]. Son age 22, curator, living with her.   ROS:  A ROS was performed and pertinent positives and negatives are included.  Exam:  Vitals:   02/26/24 0734  BP: 118/78  Pulse: 68  SpO2: 98%  Weight: 256 lb (116.1 kg)  Height: 5' 5.25 (1.657 m)      Body mass index is 42.27 kg/m.  General appearance:  Normal Thyroid :  Symmetrical, normal in size, without palpable masses or nodularity. Respiratory  Auscultation:  Clear without wheezing or rhonchi Cardiovascular  Auscultation:  Regular rate, without rubs, murmurs or gallops  Edema/varicosities:  Not grossly evident Abdominal  Soft,nontender, without masses, guarding or rebound.  Liver/spleen:  No organomegaly noted  Hernia:  None appreciated  Skin  Inspection:  Grossly normal Breasts: Examined lying and sitting.   Right: Without masses, retractions, nipple discharge or axillary  adenopathy.   Left: Without masses, retractions, nipple discharge or axillary adenopathy. Pelvic: External genitalia:  no lesions              Urethra:  normal appearing urethra with no masses, tenderness or lesions              Bartholins and Skenes: normal                 Vagina: normal appearing vagina with normal color and discharge, no lesions              Cervix: absent Bimanual Exam:  Uterus:  absent              Adnexa: no mass, fullness, tenderness              Rectovaginal: Deferred              Anus:  normal, no lesions  Tiffany Gallagher, CMA present as chaperone.   Assessment/Plan:  58 y.o. G2P1011 for annual exam.   Well female exam with routine gynecological exam - Education provided on SBEs, importance of preventative screenings, current guidelines, high calcium  diet, regular exercise, and multivitamin daily. Labs with PCP.   Postmenopausal - No HRT. S/P TVH for fibroids.   HSV infection - Plan: acyclovir  ointment (ZOVIRAX ) 5 % TID as needed. Occasional outbreaks. Does not tolerate daily Valtrex  due to itching.   Screening for cervical cancer - Normal Pap history.  No longer screening per guidelines.   Screening for breast cancer - Normal mammogram history. Continue annual screenings. Normal breast exam today.  Screening for  colon cancer - 2018 colonoscopy. Will repeat at GI's recommended interval.   Screening for osteoporosis - Normal bone density ~5 years ago. Managed by PCP.   Return in about 1 year (around 02/25/2025) for Annual.     Tiffany DELENA Shutter DNP, 8:04 AM 02/26/2024

## 2024-02-26 ENCOUNTER — Encounter: Payer: Self-pay | Admitting: Nurse Practitioner

## 2024-02-26 ENCOUNTER — Ambulatory Visit (INDEPENDENT_AMBULATORY_CARE_PROVIDER_SITE_OTHER): Admitting: Nurse Practitioner

## 2024-02-26 VITALS — BP 118/78 | HR 68 | Ht 65.25 in | Wt 256.0 lb

## 2024-02-26 DIAGNOSIS — Z780 Asymptomatic menopausal state: Secondary | ICD-10-CM

## 2024-02-26 DIAGNOSIS — Z1331 Encounter for screening for depression: Secondary | ICD-10-CM | POA: Diagnosis not present

## 2024-02-26 DIAGNOSIS — B009 Herpesviral infection, unspecified: Secondary | ICD-10-CM

## 2024-02-26 DIAGNOSIS — Z01419 Encounter for gynecological examination (general) (routine) without abnormal findings: Secondary | ICD-10-CM | POA: Diagnosis not present

## 2024-02-26 MED ORDER — ACYCLOVIR 5 % EX OINT: TOPICAL_OINTMENT | CUTANEOUS | 2 refills | Status: AC

## 2024-05-14 ENCOUNTER — Other Ambulatory Visit: Payer: Self-pay | Admitting: Family Medicine

## 2024-05-14 DIAGNOSIS — E1165 Type 2 diabetes mellitus with hyperglycemia: Secondary | ICD-10-CM

## 2024-05-14 DIAGNOSIS — E785 Hyperlipidemia, unspecified: Secondary | ICD-10-CM

## 2024-05-19 ENCOUNTER — Encounter: Payer: Self-pay | Admitting: Family Medicine

## 2024-05-19 ENCOUNTER — Ambulatory Visit: Admitting: Family Medicine

## 2024-05-19 VITALS — BP 144/96 | HR 74 | Temp 98.3°F | Resp 18 | Ht 65.25 in | Wt 254.8 lb

## 2024-05-19 DIAGNOSIS — Z6841 Body Mass Index (BMI) 40.0 and over, adult: Secondary | ICD-10-CM

## 2024-05-19 DIAGNOSIS — Z0000 Encounter for general adult medical examination without abnormal findings: Secondary | ICD-10-CM | POA: Diagnosis not present

## 2024-05-19 DIAGNOSIS — D509 Iron deficiency anemia, unspecified: Secondary | ICD-10-CM

## 2024-05-19 DIAGNOSIS — E1165 Type 2 diabetes mellitus with hyperglycemia: Secondary | ICD-10-CM

## 2024-05-19 DIAGNOSIS — I10 Essential (primary) hypertension: Secondary | ICD-10-CM | POA: Diagnosis not present

## 2024-05-19 DIAGNOSIS — K047 Periapical abscess without sinus: Secondary | ICD-10-CM

## 2024-05-19 DIAGNOSIS — E785 Hyperlipidemia, unspecified: Secondary | ICD-10-CM | POA: Diagnosis not present

## 2024-05-19 DIAGNOSIS — G4733 Obstructive sleep apnea (adult) (pediatric): Secondary | ICD-10-CM

## 2024-05-19 DIAGNOSIS — J4520 Mild intermittent asthma, uncomplicated: Secondary | ICD-10-CM | POA: Diagnosis not present

## 2024-05-19 LAB — LIPID PANEL
Cholesterol: 208 mg/dL — ABNORMAL HIGH (ref 28–200)
HDL: 46.8 mg/dL
LDL Cholesterol: 144 mg/dL — ABNORMAL HIGH (ref 10–99)
NonHDL: 161.59
Total CHOL/HDL Ratio: 4
Triglycerides: 86 mg/dL (ref 10.0–149.0)
VLDL: 17.2 mg/dL (ref 0.0–40.0)

## 2024-05-19 LAB — COMPREHENSIVE METABOLIC PANEL WITH GFR
ALT: 9 U/L (ref 3–35)
AST: 10 U/L (ref 5–37)
Albumin: 4 g/dL (ref 3.5–5.2)
Alkaline Phosphatase: 60 U/L (ref 39–117)
BUN: 15 mg/dL (ref 6–23)
CO2: 33 meq/L — ABNORMAL HIGH (ref 19–32)
Calcium: 9.3 mg/dL (ref 8.4–10.5)
Chloride: 104 meq/L (ref 96–112)
Creatinine, Ser: 0.56 mg/dL (ref 0.40–1.20)
GFR: 100.59 mL/min
Glucose, Bld: 97 mg/dL (ref 70–99)
Potassium: 3.8 meq/L (ref 3.5–5.1)
Sodium: 140 meq/L (ref 135–145)
Total Bilirubin: 0.6 mg/dL (ref 0.2–1.2)
Total Protein: 6.9 g/dL (ref 6.0–8.3)

## 2024-05-19 LAB — CBC WITH DIFFERENTIAL/PLATELET
Basophils Absolute: 0 10*3/uL (ref 0.0–0.1)
Basophils Relative: 0.5 % (ref 0.0–3.0)
Eosinophils Absolute: 0.2 10*3/uL (ref 0.0–0.7)
Eosinophils Relative: 3.6 % (ref 0.0–5.0)
HCT: 39.3 % (ref 36.0–46.0)
Hemoglobin: 13.1 g/dL (ref 12.0–15.0)
Lymphocytes Relative: 43.1 % (ref 12.0–46.0)
Lymphs Abs: 2.1 10*3/uL (ref 0.7–4.0)
MCHC: 33.3 g/dL (ref 30.0–36.0)
MCV: 95.1 fl (ref 78.0–100.0)
Monocytes Absolute: 0.4 10*3/uL (ref 0.1–1.0)
Monocytes Relative: 8.3 % (ref 3.0–12.0)
Neutro Abs: 2.1 10*3/uL (ref 1.4–7.7)
Neutrophils Relative %: 44.5 % (ref 43.0–77.0)
Platelets: 248 10*3/uL (ref 150.0–400.0)
RBC: 4.13 Mil/uL (ref 3.87–5.11)
RDW: 14.8 % (ref 11.5–15.5)
WBC: 4.8 10*3/uL (ref 4.0–10.5)

## 2024-05-19 LAB — MICROALBUMIN / CREATININE URINE RATIO
Creatinine,U: 140.9 mg/dL
Microalb Creat Ratio: 51 mg/g — ABNORMAL HIGH (ref 0.0–30.0)
Microalb, Ur: 7.2 mg/dL — ABNORMAL HIGH (ref 0.7–1.9)

## 2024-05-19 LAB — TSH: TSH: 2.77 u[IU]/mL (ref 0.35–5.50)

## 2024-05-19 LAB — VITAMIN D 25 HYDROXY (VIT D DEFICIENCY, FRACTURES): VITD: 23.34 ng/mL — ABNORMAL LOW (ref 30.00–100.00)

## 2024-05-19 LAB — HEMOGLOBIN A1C: Hgb A1c MFr Bld: 5.5 % (ref 4.6–6.5)

## 2024-05-19 LAB — VITAMIN B12: Vitamin B-12: 230 pg/mL (ref 211–911)

## 2024-05-19 MED ORDER — TRIAMCINOLONE ACETONIDE 0.1 % EX CREA: 1.0000 | TOPICAL_CREAM | Freq: Two times a day (BID) | CUTANEOUS | 3 refills | Status: AC

## 2024-05-19 MED ORDER — BENAZEPRIL-HYDROCHLOROTHIAZIDE 20-12.5 MG PO TABS: 1.0000 | ORAL_TABLET | Freq: Every day | ORAL | 3 refills | Status: AC

## 2024-05-19 MED ORDER — AMOXICILLIN-POT CLAVULANATE 875-125 MG PO TABS: 1.0000 | ORAL_TABLET | Freq: Two times a day (BID) | ORAL | 0 refills | Status: AC

## 2024-05-19 NOTE — Patient Instructions (Signed)
 Preventive Care 59-59 Years Old, Female  Preventive care refers to lifestyle choices and visits with your health care provider that can promote health and wellness. Preventive care visits are also called wellness exams.  What can I expect for my preventive care visit?  Counseling  Your health care provider may ask you questions about your:  Medical history, including:  Past medical problems.  Family medical history.  Pregnancy history.  Current health, including:  Menstrual cycle.  Method of birth control.  Emotional well-being.  Home life and relationship well-being.  Sexual activity and sexual health.  Lifestyle, including:  Alcohol, nicotine or tobacco, and drug use.  Access to firearms.  Diet, exercise, and sleep habits.  Work and work Astronomer.  Sunscreen use.  Safety issues such as seatbelt and bike helmet use.  Physical exam  Your health care provider will check your:  Height and weight. These may be used to calculate your BMI (body mass index). BMI is a measurement that tells if you are at a healthy weight.  Waist circumference. This measures the distance around your waistline. This measurement also tells if you are at a healthy weight and may help predict your risk of certain diseases, such as type 2 diabetes and high blood pressure.  Heart rate and blood pressure.  Body temperature.  Skin for abnormal spots.  What immunizations do I need?    Vaccines are usually given at various ages, according to a schedule. Your health care provider will recommend vaccines for you based on your age, medical history, and lifestyle or other factors, such as travel or where you work.  What tests do I need?  Screening  Your health care provider may recommend screening tests for certain conditions. This may include:  Lipid and cholesterol levels.  Diabetes screening. This is done by checking your blood sugar (glucose) after you have not eaten for a while (fasting).  Pelvic exam and Pap test.  Hepatitis B test.  Hepatitis C  test.  HIV (human immunodeficiency virus) test.  STI (sexually transmitted infection) testing, if you are at risk.  Lung cancer screening.  Colorectal cancer screening.  Mammogram. Talk with your health care provider about when you should start having regular mammograms. This may depend on whether you have a family history of breast cancer.  BRCA-related cancer screening. This may be done if you have a family history of breast, ovarian, tubal, or peritoneal cancers.  Bone density scan. This is done to screen for osteoporosis.  Talk with your health care provider about your test results, treatment options, and if necessary, the need for more tests.  Follow these instructions at home:  Eating and drinking    Eat a diet that includes fresh fruits and vegetables, whole grains, lean protein, and low-fat dairy products.  Take vitamin and mineral supplements as recommended by your health care provider.  Do not drink alcohol if:  Your health care provider tells you not to drink.  You are pregnant, may be pregnant, or are planning to become pregnant.  If you drink alcohol:  Limit how much you have to 0-1 drink a day.  Know how much alcohol is in your drink. In the [place] one drink equals one 12 oz bottle of beer (355 mL), one 5 oz glass of wine (148 mL), or one 1 oz glass of hard liquor (44 mL).  Lifestyle  Brush your teeth every morning and night with fluoride toothpaste. Floss one time each day.  Exercise for at least  30 minutes 5 or more days each week.  Do not use any products that contain nicotine or tobacco. These products include cigarettes, chewing tobacco, and vaping devices, such as e-cigarettes. If you need help quitting, ask your health care provider.  Do not use drugs.  If you are sexually active, practice safe sex. Use a condom or other form of protection to prevent STIs.  If you do not wish to become pregnant, use a form of birth control. If you plan to become pregnant, see your health care provider for a  prepregnancy visit.  Take aspirin only as told by your health care provider. Make sure that you understand how much to take and what form to take. Work with your health care provider to find out whether it is safe and beneficial for you to take aspirin daily.  Find healthy ways to manage stress, such as:  Meditation, yoga, or listening to music.  Journaling.  Talking to a trusted person.  Spending time with friends and family.  Minimize exposure to UV radiation to reduce your risk of skin cancer.  Safety  Always wear your seat belt while driving or riding in a vehicle.  Do not drive:  If you have been drinking alcohol. Do not ride with someone who has been drinking.  When you are tired or distracted.  While texting.  If you have been using any mind-altering substances or drugs.  Wear a helmet and other protective equipment during sports activities.  If you have firearms in your house, make sure you follow all gun safety procedures.  Seek help if you have been physically or sexually abused.  What's next?  Visit your health care provider once a year for an annual wellness visit.  Ask your health care provider how often you should have your eyes and teeth checked.  Stay up to date on all vaccines.  This information is not intended to replace advice given to you by your health care provider. Make sure you discuss any questions you have with your health care provider.  Document Revised: 10/03/2020 Document Reviewed: 10/03/2020  Elsevier Patient Education  2024 [employer].

## 2024-05-19 NOTE — Assessment & Plan Note (Signed)
 stable  stable

## 2024-05-19 NOTE — Assessment & Plan Note (Signed)
 Well controlled, no changes to meds. Encouraged heart healthy diet such as the DASH diet and exercise as tolerated.

## 2024-05-19 NOTE — Assessment & Plan Note (Signed)
 Encourage heart healthy diet such as MIND or DASH diet, increase exercise, avoid trans fats, simple carbohydrates and processed foods, consider a krill or fish or flaxseed oil cap daily.

## 2024-05-19 NOTE — Assessment & Plan Note (Signed)
 Ghm utd Check labs See AVS  Health Maintenance  Topic Date Due   Hepatitis B Vaccines 19-59 Average Risk (1 of 3 - 19+ 3-dose series) Never done   Zoster Vaccines- Shingrix (1 of 2) Never done   COVID-19 Vaccine (3 - Moderna risk series) 06/30/2020   OPHTHALMOLOGY EXAM  11/07/2023   Diabetic kidney evaluation - Urine ACR  05/14/2024   HEMOGLOBIN A1C  05/14/2024   Influenza Vaccine  07/19/2024 (Originally 11/20/2023)   Diabetic kidney evaluation - eGFR measurement  11/11/2024   FOOT EXAM  05/19/2025   Mammogram  01/17/2026   Colonoscopy  05/19/2026   DTaP/Tdap/Td (3 - Td or Tdap) 03/15/2028   Pneumococcal Vaccine: 50+ Years  Completed   HPV VACCINES (No Doses Required) Completed   Hepatitis C Screening  Completed   HIV Screening  Completed   Meningococcal B Vaccine  Aged Out

## 2024-05-19 NOTE — Progress Notes (Signed)
 "  Subjective:    Patient ID: Tiffany Gallagher, female    DOB: [birth year], 59 y.o.   MRN: [id number]  Chief Complaint  Patient presents with   Annual Exam    Pt states fasting     HPI Patient is in today for cpe and f/u bp , dm and cholesterol.  Discussed the use of AI scribe software for clinical note transcription with the patient, who gave verbal consent to proceed.  History of Present Illness Tiffany Gallagher is a 59 year old female who presents for an annual physical exam.  She notes her blood pressure was elevated today and did not take her medication as she generally avoids taking it on an empty stomach. Her blood pressure medication was previously increased when her diabetes was diagnosed, but she is unsure if the diuretic should also be adjusted. She sometimes feels 'funny' when taking her medication.  Her blood sugar levels have been averaging around 104 mg/dL, occasionally dropping to the 80s when she does not eat. She manages her diet with high-protein snacks like almonds and peanut butter. She uses an Accu-Check to monitor her blood sugar but did not check it this morning.  She has eczema, primarily on her hands, and uses triamcinolone  cream for this condition.  She has a history of kidney stones and was previously prescribed Flomax , which she is no longer taking.  She reports having an abscess on her tooth, which has persisted since the end of December, and plans to see a dentist.  Her breathing has been good, and she still has an albuterol  inhaler, although her insurance no longer covers it.  She recently visited a foot doctor. She also plans to visit an eye doctor this year and regularly sees a education officer, community.    Past Medical History:  Diagnosis Date   Allergy    Anemia    Arthritis    Diabetes mellitus without complication (HCC)    Diverticulitis    Fibroid    GERD (gastroesophageal reflux disease)    Glaucoma    Hyperlipidemia    Hypertension    Migraines     Obesity, morbid, BMI 40.0-49.9 (HCC)    Sleep apnea    cpap    Past Surgical History:  Procedure Laterality Date   bone spur     removed   CESAREAN SECTION  1995   VAGINAL HYSTERECTOMY  1998   partial    Family History  Problem Relation Age of Onset   Coronary artery disease Mother    Hypertension Mother    Kidney disease Mother 75       dialysis   Aneurysm Mother        brain   Stroke Mother    Heart disease Mother        MI   Cancer Father 44       brain cancer   Diabetes Sister    Hyperlipidemia Sister    Hypertension Sister    Hypertension Sister    Arthritis Brother    Hypertension Brother    Diabetes Brother    Hypertension Brother    Heart attack Brother     Social History   Socioeconomic History   Marital status: Divorced    Spouse name: Not on file   Number of children: 1   Years of education: 17   Highest education level: Bachelor's degree (e.g., BA, AB, BS)  Occupational History   Occupation: customer service   Tobacco Use   Smoking  status: Every Day    Current packs/day: 1.00    Average packs/day: 0.5 packs/day for 33.1 years (17.1 ttl pk-yrs)    Types: Cigarettes    Start date: 2025   Smokeless tobacco: Never   Tobacco comments:    0.5-1 ppd   04/30/2023  Vaping Use   Vaping status: Never Used  Substance and Sexual Activity   Alcohol use: Not Currently    Comment: Occas wine   Drug use: Yes    Types: Marijuana    Comment: occ   Sexual activity: Not Currently    Partners: Male    Birth control/protection: Surgical, Abstinence    Comment: hysterectomy  Other Topics Concern   Not on file  Social History Narrative   Exercise-- just starting to walk 4 -7 days a week , yoga    Social Drivers of Health   Tobacco Use: High Risk (05/19/2024)   Patient History    Smoking Tobacco Use: Every Day    Smokeless Tobacco Use: Never    Passive Exposure: Not on file  Financial Resource Strain: Low Risk (12/22/2023)   Overall Financial Resource  Strain (CARDIA)    Difficulty of Paying Living Expenses: Not hard at all  Food Insecurity: No Food Insecurity (12/22/2023)   Epic    Worried About Radiation Protection Practitioner of Food in the Last Year: Never true    Ran Out of Food in the Last Year: Never true  Transportation Needs: No Transportation Needs (12/22/2023)   Epic    Lack of Transportation (Medical): No    Lack of Transportation (Non-Medical): No  Physical Activity: Insufficiently Active (12/22/2023)   Exercise Vital Sign    Days of Exercise per Week: 4 days    Minutes of Exercise per Session: 30 min  Stress: No Stress Concern Present (12/22/2023)   [employer] of Occupational Health - Occupational Stress Questionnaire    Feeling of Stress: Only a little  Social Connections: Moderately Isolated (12/22/2023)   Social Connection and Isolation Panel    Frequency of Communication with Friends and Family: Three times a week    Frequency of Social Gatherings with Friends and Family: Three times a week    Attends Religious Services: More than 4 times per year    Active Member of Clubs or Organizations: No    Attends Banker Meetings: Not on file    Marital Status: Divorced  Intimate Partner Violence: Not on file  Depression (PHQ2-9): Low Risk (05/19/2024)   Depression (PHQ2-9)    PHQ-2 Score: 0  Alcohol Screen: Low Risk (12/22/2023)   Alcohol Screen    Last Alcohol Screening Score (AUDIT): 1  Housing: Low Risk (12/22/2023)   Epic    Unable to Pay for Housing in the Last Year: No    Number of Times Moved in the Last Year: 0    Homeless in the Last Year: No  Utilities: Not on file  Health Literacy: Not on file    Outpatient Medications Prior to Visit  Medication Sig Dispense Refill   ACCU-CHEK GUIDE TEST test strip CHECK BLOOD SUGARS 3 TIMES DAILY 300 strip 12   Accu-Chek Softclix Lancets lancets 1 EACH BY DOES NOT APPLY ROUTE IN THE MORNING, AT NOON, AND AT BEDTIME 100 each 2   acyclovir  ointment (ZOVIRAX ) 5 % Apply three times  per day as needed 30 g 2   albuterol  (VENTOLIN  HFA) 108 (90 Base) MCG/ACT inhaler Inhale 2 puffs into the lungs every 4 (four) hours as needed  for wheezing or shortness of breath. 1 each 0   Ascorbic Acid (VITAMIN C) 100 MG tablet Take 100 mg by mouth daily.     BD PEN NEEDLE NANO 2ND GEN 32G X 4 MM MISC TO USE W/ LANTUS  100 each 1   Blood Glucose Monitoring Suppl DEVI 1 each by Does not apply route in the morning, at noon, and at bedtime. May substitute to any manufacturer covered by patient's insurance. 1 each 0   Multiple Vitamin (MULTIVITAMIN WITH MINERALS) TABS Take 1 tablet by mouth daily.     omeprazole  (PRILOSEC) 20 MG capsule Take 1 capsule (20 mg total) by mouth daily. 90 capsule 3   OVER THE COUNTER MEDICATION Take by mouth daily. [employer] multivitamin     Pitavastatin  Calcium  2 MG TABS Take 1 tablet (2 mg total) by mouth daily. 90 tablet 3   VITAMIN D  PO Take 4,000 Units by mouth daily.     benazepril  (LOTENSIN ) 20 MG tablet Take 1 tablet (20 mg total) by mouth daily. 90 tablet 3   hydrochlorothiazide  (HYDRODIURIL ) 25 MG tablet Take 1 tablet (25 mg total) by mouth daily. 90 tablet 3   tamsulosin  (FLOMAX ) 0.4 MG CAPS capsule TAKE 1 CAPSULE (0.4 MG TOTAL) BY MOUTH AT BEDTIME AS NEEDED. 90 capsule 1   triamcinolone  cream (KENALOG ) 0.1 % Apply 1 Application topically 2 (two) times daily.     EPINEPHrine  (EPIPEN  2-PAK) 0.3 mg/0.3 mL IJ SOAJ injection Inject 0.3 mg into the muscle as needed for anaphylaxis. (Patient not taking: Reported on 05/19/2024) 1 each 1   tirzepatide  (MOUNJARO ) 2.5 MG/0.5ML Pen Inject 2.5 mg into the skin once a week. (Patient not taking: Reported on 05/19/2024) 2 mL 0   No facility-administered medications prior to visit.    Allergies  Allergen Reactions   Fish Allergy Hives   Egg Protein-Containing Drug Products    Lactose Intolerance (Gi)    Milk-Related Compounds Other (See Comments)   Valtrex  [Valacyclovir ]     itching   Lipitor [Atorvastatin  Calcium ]      Nerves, muscle spasms, light stool   Shellfish Allergy Hives    Review of Systems  Constitutional:  Negative for chills, fever and malaise/fatigue.  HENT:  Negative for congestion and hearing loss.   Eyes:  Negative for blurred vision and discharge.  Respiratory:  Negative for cough, sputum production and shortness of breath.   Cardiovascular:  Negative for chest pain, palpitations and leg swelling.  Gastrointestinal:  Negative for abdominal pain, blood in stool, constipation, diarrhea, heartburn, nausea and vomiting.  Genitourinary:  Negative for dysuria, frequency, hematuria and urgency.  Musculoskeletal:  Negative for back pain, falls and myalgias.  Skin:  Negative for rash.  Neurological:  Negative for dizziness, sensory change, loss of consciousness, weakness and headaches.  Endo/Heme/Allergies:  Negative for environmental allergies. Does not bruise/bleed easily.  Psychiatric/Behavioral:  Negative for depression and suicidal ideas. The patient is not nervous/anxious and does not have insomnia.        Objective:    Physical Exam Vitals and nursing note reviewed.  Constitutional:      General: She is not in acute distress.    Appearance: Normal appearance. She is well-developed.  HENT:     Head: Normocephalic and atraumatic.     Right Ear: Tympanic membrane, ear canal and external ear normal. There is no impacted cerumen.     Left Ear: Tympanic membrane, ear canal and external ear normal. There is no impacted cerumen.  Nose: Nose normal.     Mouth/Throat:     Mouth: Mucous membranes are moist.     Pharynx: Oropharynx is clear. No oropharyngeal exudate or posterior oropharyngeal erythema.  Eyes:     General: No scleral icterus.       Right eye: No discharge.        Left eye: No discharge.     Conjunctiva/sclera: Conjunctivae normal.     Pupils: Pupils are equal, round, and reactive to light.  Neck:     Thyroid : No thyromegaly or thyroid  tenderness.     Vascular: No  JVD.  Cardiovascular:     Rate and Rhythm: Normal rate and regular rhythm.     Heart sounds: Normal heart sounds. No murmur heard. Pulmonary:     Effort: Pulmonary effort is normal. No respiratory distress.     Breath sounds: Normal breath sounds.  Abdominal:     General: Bowel sounds are normal. There is no distension.     Palpations: Abdomen is soft. There is no mass.     Tenderness: There is no abdominal tenderness. There is no guarding or rebound.  Genitourinary:    Vagina: Normal.  Musculoskeletal:        General: Normal range of motion.     Cervical back: Normal range of motion and neck supple.     Right lower leg: No edema.     Left lower leg: No edema.  Lymphadenopathy:     Cervical: No cervical adenopathy.  Skin:    General: Skin is warm and dry.     Findings: No erythema or rash.  Neurological:     Mental Status: She is alert and oriented to person, place, and time.     Cranial Nerves: No cranial nerve deficit.     Deep Tendon Reflexes: Reflexes are normal and symmetric.  Psychiatric:        Mood and Affect: Mood normal.        Behavior: Behavior normal.        Thought Content: Thought content normal.        Judgment: Judgment normal.     BP (!) 144/96 (BP Location: Left Arm, Patient Position: Sitting, Cuff Size: Large)   Pulse 74   Temp 98.3 F (36.8 C) (Oral)   Resp 18   Ht 5' 5.25 (1.657 m)   Wt 254 lb 12.8 oz (115.6 kg)   SpO2 98%   BMI 42.08 kg/m  Wt Readings from Last 3 Encounters:  05/19/24 254 lb 12.8 oz (115.6 kg)  02/26/24 256 lb (116.1 kg)  12/22/23 256 lb 6.4 oz (116.3 kg)    Diabetic Foot Exam - Simple   No data filed    Lab Results  Component Value Date   WBC 4.6 11/12/2023   HGB 12.2 11/12/2023   HCT 37.9 11/12/2023   PLT 262.0 11/12/2023   GLUCOSE 97 11/12/2023   CHOL 200 11/12/2023   TRIG 87.0 11/12/2023   HDL 49.30 11/12/2023   LDLDIRECT 172.0 10/20/2022   LDLCALC 133 (H) 11/12/2023   ALT 9 11/12/2023   AST 10 11/12/2023    NA 141 11/12/2023   K 3.9 11/12/2023   CL 105 11/12/2023   CREATININE 0.64 11/12/2023   BUN 21 11/12/2023   CO2 30 11/12/2023   TSH 1.98 04/30/2023   HGBA1C 5.6 11/12/2023   MICROALBUR 4.9 (H) 11/12/2023    Lab Results  Component Value Date   TSH 1.98 04/30/2023   Lab Results  Component Value  Date   WBC 4.6 11/12/2023   HGB 12.2 11/12/2023   HCT 37.9 11/12/2023   MCV 93.7 11/12/2023   PLT 262.0 11/12/2023   Lab Results  Component Value Date   NA 141 11/12/2023   K 3.9 11/12/2023   CO2 30 11/12/2023   GLUCOSE 97 11/12/2023   BUN 21 11/12/2023   CREATININE 0.64 11/12/2023   BILITOT 0.6 11/12/2023   ALKPHOS 64 11/12/2023   AST 10 11/12/2023   ALT 9 11/12/2023   PROT 6.8 11/12/2023   ALBUMIN 4.0 11/12/2023   CALCIUM  9.5 11/12/2023   ANIONGAP 15 10/19/2022   EGFR 86 03/12/2023   GFR 97.76 11/12/2023   Lab Results  Component Value Date   CHOL 200 11/12/2023   Lab Results  Component Value Date   HDL 49.30 11/12/2023   Lab Results  Component Value Date   LDLCALC 133 (H) 11/12/2023   Lab Results  Component Value Date   TRIG 87.0 11/12/2023   Lab Results  Component Value Date   CHOLHDL 4 11/12/2023   Lab Results  Component Value Date   HGBA1C 5.6 11/12/2023       Assessment & Plan:  Preventative health care Assessment & Plan: Ghm utd Check labs See AVS  Health Maintenance  Topic Date Due   Hepatitis B Vaccines 19-59 Average Risk (1 of 3 - 19+ 3-dose series) Never done   Zoster Vaccines- Shingrix (1 of 2) Never done   COVID-19 Vaccine (3 - Moderna risk series) 06/30/2020   OPHTHALMOLOGY EXAM  11/07/2023   Diabetic kidney evaluation - Urine ACR  05/14/2024   HEMOGLOBIN A1C  05/14/2024   Influenza Vaccine  07/19/2024 (Originally 11/20/2023)   Diabetic kidney evaluation - eGFR measurement  11/11/2024   FOOT EXAM  05/19/2025   Mammogram  01/17/2026   Colonoscopy  05/19/2026   DTaP/Tdap/Td (3 - Td or Tdap) 03/15/2028   Pneumococcal Vaccine: 50+  Years  Completed   HPV VACCINES (No Doses Required) Completed   Hepatitis C Screening  Completed   HIV Screening  Completed   Meningococcal B Vaccine  Aged Out     Orders: -     Lipid panel -     CBC with Differential/Platelet -     Comprehensive metabolic panel with GFR -     TSH  Type 2 diabetes mellitus with hyperglycemia, without long-term current use of insulin  (HCC) -     Lipid panel -     CBC with Differential/Platelet -     Comprehensive metabolic panel with GFR -     Hemoglobin A1c -     Microalbumin / creatinine urine ratio -     Insulin , random  Hyperlipidemia, unspecified hyperlipidemia type Assessment & Plan: Encourage heart healthy diet such as MIND or DASH diet, increase exercise, avoid trans fats, simple carbohydrates and processed foods, consider a krill or fish or flaxseed oil cap daily.    Orders: -     Lipid panel -     Comprehensive metabolic panel with GFR  Iron deficiency anemia, unspecified iron deficiency anemia type -     CBC with Differential/Platelet  Essential hypertension Assessment & Plan: Well controlled, no changes to meds. Encouraged heart healthy diet such as the DASH diet and exercise as tolerated.    Orders: -     Lipid panel -     CBC with Differential/Platelet -     Comprehensive metabolic panel with GFR -     Hemoglobin A1c -  Microalbumin / creatinine urine ratio -     Benazepril -hydroCHLOROthiazide ; Take 1 tablet by mouth daily.  Dispense: 90 tablet; Refill: 3  Severe obesity (BMI >= 40) (HCC) -     Vitamin B12 -     VITAMIN D  25 Hydroxy (Vit-D Deficiency, Fractures)  OSA (obstructive sleep apnea)  Abscessed tooth -     Amoxicillin -Pot Clavulanate; Take 1 tablet by mouth 2 (two) times daily.  Dispense: 20 tablet; Refill: 0  Mild intermittent asthma without complication Assessment & Plan: stable   Other orders -     Triamcinolone  Acetonide; Apply 1 Application topically 2 (two) times daily.  Dispense: 45 g;  Refill: 3    Tiffany JONELLE Antonio Cyndee, DO  "

## 2024-05-20 LAB — INSULIN, RANDOM: Insulin: 9.9 u[IU]/mL

## 2025-02-27 ENCOUNTER — Ambulatory Visit: Admitting: Nurse Practitioner
